# Patient Record
Sex: Female | Born: 1965 | Race: White | Hispanic: No | Smoking: Former smoker
Health system: Southern US, Community
[De-identification: ages and names within clinical notes are randomized; demographics above are authoritative.]

## PROBLEM LIST (undated history)

## (undated) DIAGNOSIS — C539 Malignant neoplasm of cervix uteri, unspecified: Secondary | ICD-10-CM

## (undated) DIAGNOSIS — Z973 Presence of spectacles and contact lenses: Secondary | ICD-10-CM

## (undated) DIAGNOSIS — Z923 Personal history of irradiation: Secondary | ICD-10-CM

## (undated) DIAGNOSIS — T7840XA Allergy, unspecified, initial encounter: Secondary | ICD-10-CM

## (undated) DIAGNOSIS — K219 Gastro-esophageal reflux disease without esophagitis: Secondary | ICD-10-CM

## (undated) DIAGNOSIS — I10 Essential (primary) hypertension: Secondary | ICD-10-CM

## (undated) HISTORY — DX: Allergy, unspecified, initial encounter: T78.40XA

## (undated) HISTORY — DX: Gastro-esophageal reflux disease without esophagitis: K21.9

---

## 2019-02-10 DIAGNOSIS — U071 COVID-19: Secondary | ICD-10-CM

## 2019-02-10 HISTORY — DX: COVID-19: U07.1

## 2020-01-10 ENCOUNTER — Telehealth: Payer: Self-pay | Admitting: *Deleted

## 2020-01-10 NOTE — Telephone Encounter (Signed)
Called the patient and scheduled a new patient appt on 12/3 at 2:15 pm with Dr Berline Lopes. Patient given the address and phone number for the clinic. Patient also given the policy for mask and visitors

## 2020-01-11 ENCOUNTER — Encounter: Payer: Self-pay | Admitting: Gynecologic Oncology

## 2020-01-12 ENCOUNTER — Other Ambulatory Visit: Payer: Self-pay | Admitting: Gynecologic Oncology

## 2020-01-12 ENCOUNTER — Inpatient Hospital Stay: Payer: Medicaid Other

## 2020-01-12 ENCOUNTER — Telehealth: Payer: Self-pay | Admitting: Oncology

## 2020-01-12 ENCOUNTER — Other Ambulatory Visit: Payer: Self-pay

## 2020-01-12 ENCOUNTER — Encounter: Payer: Self-pay | Admitting: Gynecologic Oncology

## 2020-01-12 ENCOUNTER — Inpatient Hospital Stay: Payer: Medicaid Other | Attending: Gynecologic Oncology | Admitting: Gynecologic Oncology

## 2020-01-12 VITALS — BP 158/77 | HR 83 | Temp 98.2°F | Resp 20 | Ht 66.0 in | Wt 162.0 lb

## 2020-01-12 DIAGNOSIS — Z23 Encounter for immunization: Secondary | ICD-10-CM | POA: Diagnosis not present

## 2020-01-12 DIAGNOSIS — N950 Postmenopausal bleeding: Secondary | ICD-10-CM | POA: Insufficient documentation

## 2020-01-12 DIAGNOSIS — C539 Malignant neoplasm of cervix uteri, unspecified: Secondary | ICD-10-CM

## 2020-01-12 LAB — CBC (CANCER CENTER ONLY)
HCT: 38.4 % (ref 36.0–46.0)
Hemoglobin: 12.5 g/dL (ref 12.0–15.0)
MCH: 31.6 pg (ref 26.0–34.0)
MCHC: 32.6 g/dL (ref 30.0–36.0)
MCV: 97 fL (ref 80.0–100.0)
Platelet Count: 294 10*3/uL (ref 150–400)
RBC: 3.96 MIL/uL (ref 3.87–5.11)
RDW: 12.6 % (ref 11.5–15.5)
WBC Count: 10.5 10*3/uL (ref 4.0–10.5)
nRBC: 0 % (ref 0.0–0.2)

## 2020-01-12 LAB — CMP (CANCER CENTER ONLY)
ALT: 36 U/L (ref 0–44)
AST: 47 U/L — ABNORMAL HIGH (ref 15–41)
Albumin: 3.9 g/dL (ref 3.5–5.0)
Alkaline Phosphatase: 66 U/L (ref 38–126)
Anion gap: 11 (ref 5–15)
BUN: 7 mg/dL (ref 6–20)
CO2: 24 mmol/L (ref 22–32)
Calcium: 10.1 mg/dL (ref 8.9–10.3)
Chloride: 106 mmol/L (ref 98–111)
Creatinine: 0.7 mg/dL (ref 0.44–1.00)
GFR, Estimated: >60 mL/min (ref >60)
Glucose, Bld: 92 mg/dL (ref 70–99)
Potassium: 4.3 mmol/L (ref 3.5–5.1)
Sodium: 141 mmol/L (ref 135–145)
Total Bilirubin: 0.4 mg/dL (ref 0.3–1.2)
Total Protein: 9.1 g/dL — ABNORMAL HIGH (ref 6.5–8.1)

## 2020-01-12 NOTE — H&P (View-Only) (Signed)
GYNECOLOGIC ONCOLOGY NEW PATIENT CONSULTATION   Patient Name: Madeline Howard  Patient Age: 54 y.o. Date of Service: 01/12/20 Referring Provider: Dr. Addison Bailey     Primary Care Provider: System, Provider Not In Consulting Provider: Jeral Pinch, MD   Assessment/Plan:  Postmenopausal patient with poor access to care presenting with findings concerning for cervix cancer and possibly concurrent uterine cancer.  I discussed with the patient results from her recent biopsy and Pap test.  I am concerned based on her exam and friability of her cervix that she does not fact have a cervical cancer.  There is no obvious mass on exam.  I took a couple of biopsies from a denuded and somewhat ulcerative portion of the cervix.  I will call her with these results which will hopefully be back on Monday.  Given the scant EMB, we asked pathology to further clarify if the biopsy appeared to be adeno carcinoma or squamous cell carcinoma.  It is unclear to me at this time whether there is a concurrent uterine malignancy or that the tissue sampled on endometrial biopsy is in fact squamous.  Either way, given my exam today, I think we will need more tissue for diagnostic purposes.    I recommended plan for outpatient procedure next week to include hysteroscopy, D&C, and cervical cold knife cone.  I also have ordered an ultrasound, which will be performed on Monday, and a CT scan.  Patient understands that pending the biopsy results from today, surgical plan as well as imaging may change.  A copy of this note was sent to the patient's referring provider.   60 minutes of total time was spent for this patient encounter, including preparation, face-to-face counseling with the patient and coordination of care, and documentation of the encounter.  Jeral Pinch, MD  Division of Gynecologic Oncology  Department of Obstetrics and Gynecology  [facility]   ___________________________________________  Chief Complaint: Chief Complaint  Patient presents with  . Malignant neoplasm of cervix, unspecified site [facility])    History of Present Illness:  Madeline Howard is a 54 y.o. y.o. female who is seen in consultation at the request of Dr. 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  Patient reports only accessing medical care in multiple decades when she has been very sick.  She has not had a primary care provider that she can remember ever.  Her regular menses stopped 2-3 years ago.  She had no postmenopausal bleeding until this November.  She had an episode where she woke up at night with very heavy bleeding and passage of clots.  She tried to get an appointment but this took several weeks.  After a second episode, she called again and was able to be seen quickly.  She denies any abdominal pain or cramping until after her exam and recent biopsy.  She continues to have irregular bleeding, some days she has no bleeding, other she has heavy bleeding or spotting.  She was aeen on 11/18 with several days of PMB with cramping. Underwent EMB showing poorly differentiated carcinoma. 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 + HR HPV.   Patient is uninsured.  She has never had a Pap test, mammogram, or colonoscopy.  She is never had surgery.  She broke her right ankle and foot earlier this year but did not require surgical intervention.  She endorses having a good appetite  without nausea or emesis.  She reports regular bowel function and denies any urinary symptoms.  She denies back pain and leg swelling.  Patient lives in [place] with her daughter and boyfriend.  She vapes, denies cigarette use.  She denies alcohol and recreational drug use.  She is not currently working.  PAST MEDICAL HISTORY:  Past Medical History:  Diagnosis  Date  . Allergy   . GERD (gastroesophageal reflux disease)      PAST SURGICAL HISTORY:  History reviewed. No pertinent surgical history.  OB/GYN HISTORY:  OB History  Gravida Para Term Preterm AB Living  2 2          SAB TAB Ectopic Multiple Live Births               # Outcome Date GA Lbr Len/2nd Weight Sex Delivery Anes PTL Lv  2 Para           1 Para             No LMP recorded.  Age at menarche: 45  Age at menopause: 2-3 years ago Hx of HRT: denies Hx of STDs: denies Last pap: has never had prior to this year History of abnormal pap smears: see HPI  SCREENING STUDIES:  Last mammogram: n/a  Last colonoscopy: n/a Last bone mineral density: n/a  MEDICATIONS: Outpatient Encounter Medications as of 01/12/2020  Medication Sig  . aspirin-acetaminophen-caffeine (EXCEDRIN MIGRAINE) 250-250-65 MG tablet Take by mouth every 6 (six) hours as needed for headache.  . Chlorpheniramine Maleate (ALLERGY PO) Take by mouth.  Marland Kitchen ibuprofen (ADVIL) 200 MG tablet Take 200 mg by mouth every 6 (six) hours as needed.  . Omega-3 Fatty Acids (FISH OIL) 1000 MG CAPS Take 1 capsule by mouth daily.  . raNITIdine HCl (ACID REDUCER PO) Take by mouth.   No facility-administered encounter medications on file as of 01/12/2020.    ALLERGIES:  No Known Allergies   FAMILY HISTORY:  Family History  Problem Relation Age of Onset  . Cancer Mother        colon  . Diabetes Father   . Cancer Brother        melanoma  . Breast cancer Neg Hx   . Uterine cancer Neg Hx   . Ovarian cancer Neg Hx      SOCIAL HISTORY:    Social Connections:   . Frequency of Communication with Friends and Family: Not on file  . Frequency of Social Gatherings with Friends and Family: Not on file  . Attends Religious Services: Not on file  . Active Member of Clubs or Organizations: Not on file  . Attends Archivist Meetings: Not on file  . Marital Status: Not on file    REVIEW OF SYSTEMS:  + Hot flashes  that started in January of this year when she got Covid, vaginal bleeding Denies appetite changes, fevers, chills, fatigue, unexplained weight changes. Denies hearing loss, neck lumps or masses, mouth sores, ringing in ears or voice changes. Denies cough or wheezing.  Denies shortness of breath. Denies chest pain or palpitations. Denies leg swelling. Denies abdominal distention, pain, blood in stools, constipation, diarrhea, nausea, vomiting, or early satiety. Denies pain with intercourse, dysuria, frequency, hematuria or incontinence. Denies pelvic pain or vaginal discharge.   Denies joint pain, back pain or muscle pain/cramps. Denies itching, rash, or wounds. Denies dizziness, headaches, numbness or seizures. Denies swollen lymph nodes or glands, denies easy bruising or bleeding. Denies anxiety, depression, confusion, or decreased concentration.  Physical Exam:  Vital Signs for this encounter:  Blood pressure (!) 158/77, pulse 83, temperature 98.2 F (36.8 C), temperature source Tympanic, resp. rate 20, height 5\' 6"  (1.676 m), weight 162 lb (73.5 kg), SpO2 100 %. Body mass index is 26.15 kg/m. General: Alert, oriented, no acute distress.  HEENT: Normocephalic, atraumatic. Sclera anicteric.  Chest: Clear to auscultation bilaterally. No wheezes, rhonchi, or rales. Cardiovascular: Regular rate and rhythm, no murmurs, rubs, or gallops.  Abdomen: Normoactive bowel sounds. Soft, nondistended, nontender to palpation. No masses or hepatosplenomegaly appreciated. No palpable fluid wave.  Extremities: Grossly normal range of motion. Warm, well perfused. No edema bilaterally.  Skin: No rashes or lesions.  Lymphatics: No cervical, supraclavicular, or inguinal adenopathy.  GU:  Normal external female genitalia.  No lesions. No discharge or bleeding.             Bladder/urethra:  No lesions or masses, well supported bladder             Vagina: Well rugated vaginal mucosa.             Cervix:  Multiparous cervix, no gross lesions apparent.  Slow oozing from the os when exam started.  Laxity of the sidewalls caused some increased difficulty with the exam.  After application of acetic acid, there is some acetowhite changes and almost ulcerative appearance of the 3-6:00 portion of the cervix.  2 biopsies taken from this area with significant bleeding after requiring pressure and Monsel's.  ECC also performed with a Cytobrush.             Uterus: Small, mobile.  Cervix is normal in size although somewhat prominent and mildly firm.  There is some loss of the posterior cervicovaginal junction.  There is also an exophytic smoothest 1 cm lesion from the posterior aspect of the cervix that can be palpated on rectovaginal exam.  No definite parametrial involvement.             Adnexa: No masses appreciated.  Procedure: Cervical biopsy and ECC Preoperative diagnosis: Squamous cell carcinoma on Pap test Postoperative diagnosis: Same as above Physician: Jeral Pinch, MD Estimated blood loss: 75 cc Specimens: 2 biopsies of the cervix from 3 and 5 o'clock, ECC Procedure: After the procedure was discussed with the patient including risks and benefits, verbal consent was obtained.  A speculum was placed in the vagina and the cervix was well visualized.  After application of acetic acid, decision was made to proceed with cervical biopsies.  Betadine was used x3 to cleanse the cervix.  2 biopsies were taken along the left aspect of the cervix with Tischler forceps and placed in formalin.  Brisk bleeding was noted after biopsies with hemostasis achieved with Monsel solution and pressure.  Overall the patient tolerated the procedure well.  An ECC was also performed with a Cytobrush.  All specimens were placed in formalin.  LABORATORY AND RADIOLOGIC DATA:  Outside medical records were reviewed to synthesize the above history, along with the history and physical obtained during the visit.   Lab Results   Component Value Date   WBC 10.5 01/12/2020   HGB 12.5 01/12/2020   HCT 38.4 01/12/2020   PLT 294 01/12/2020   GLUCOSE 92 01/12/2020   ALT 36 01/12/2020   AST 47 (H) 01/12/2020   NA 141 01/12/2020   K 4.3 01/12/2020   CL 106 01/12/2020   CREATININE 0.70 01/12/2020   BUN 7 01/12/2020   CO2 24 01/12/2020

## 2020-01-12 NOTE — Progress Notes (Signed)
GYNECOLOGIC ONCOLOGY NEW PATIENT CONSULTATION   Patient Name: Madeline Howard  Patient Age: 54 y.o. Date of Service: 01/12/20 Referring Provider: Dr. Addison Bailey     Primary Care Provider: System, Provider Not In Consulting Provider: Jeral Pinch, MD   Assessment/Plan:  Postmenopausal patient with poor access to care presenting with findings concerning for cervix cancer and possibly concurrent uterine cancer.  I discussed with the patient results from her recent biopsy and Pap test.  I am concerned based on her exam and friability of her cervix that she does not fact have a cervical cancer.  There is no obvious mass on exam.  I took a couple of biopsies from a denuded and somewhat ulcerative portion of the cervix.  I will call her with these results which will hopefully be back on Monday.  Given the scant EMB, we asked pathology to further clarify if the biopsy appeared to be adeno carcinoma or squamous cell carcinoma.  It is unclear to me at this time whether there is a concurrent uterine malignancy or that the tissue sampled on endometrial biopsy is in fact squamous.  Either way, given my exam today, I think we will need more tissue for diagnostic purposes.    I recommended plan for outpatient procedure next week to include hysteroscopy, D&C, and cervical cold knife cone.  I also have ordered an ultrasound, which will be performed on Monday, and a CT scan.  Patient understands that pending the biopsy results from today, surgical plan as well as imaging may change.  A copy of this note was sent to the patient's referring provider.   60 minutes of total time was spent for this patient encounter, including preparation, face-to-face counseling with the patient and coordination of care, and documentation of the encounter.  Jeral Pinch, MD  Division of Gynecologic Oncology  Department of Obstetrics and Gynecology  [facility]   ___________________________________________  Chief Complaint: Chief Complaint  Patient presents with  . Malignant neoplasm of cervix, unspecified site [facility])    History of Present Illness:  Madeline Howard is a 54 y.o. y.o. female who is seen in consultation at the request of Dr. 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  Patient reports only accessing medical care in multiple decades when she has been very sick.  She has not had a primary care provider that she can remember ever.  Her regular menses stopped 2-3 years ago.  She had no postmenopausal bleeding until this November.  She had an episode where she woke up at night with very heavy bleeding and passage of clots.  She tried to get an appointment but this took several weeks.  After a second episode, she called again and was able to be seen quickly.  She denies any abdominal pain or cramping until after her exam and recent biopsy.  She continues to have irregular bleeding, some days she has no bleeding, other she has heavy bleeding or spotting.  She was aeen on 11/18 with several days of PMB with cramping. Underwent EMB showing poorly differentiated carcinoma. 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 + HR HPV.   Patient is uninsured.  She has never had a Pap test, mammogram, or colonoscopy.  She is never had surgery.  She broke her right ankle and foot earlier this year but did not require surgical intervention.  She endorses having a good appetite  without nausea or emesis.  She reports regular bowel function and denies any urinary symptoms.  She denies back pain and leg swelling.  Patient lives in [place] with her daughter and boyfriend.  She vapes, denies cigarette use.  She denies alcohol and recreational drug use.  She is not currently working.  PAST MEDICAL HISTORY:  Past Medical History:  Diagnosis  Date  . Allergy   . GERD (gastroesophageal reflux disease)      PAST SURGICAL HISTORY:  History reviewed. No pertinent surgical history.  OB/GYN HISTORY:  OB History  Gravida Para Term Preterm AB Living  2 2          SAB TAB Ectopic Multiple Live Births               # Outcome Date GA Lbr Len/2nd Weight Sex Delivery Anes PTL Lv  2 Para           1 Para             No LMP recorded.  Age at menarche: 63  Age at menopause: 2-3 years ago Hx of HRT: denies Hx of STDs: denies Last pap: has never had prior to this year History of abnormal pap smears: see HPI  SCREENING STUDIES:  Last mammogram: n/a  Last colonoscopy: n/a Last bone mineral density: n/a  MEDICATIONS: Outpatient Encounter Medications as of 01/12/2020  Medication Sig  . aspirin-acetaminophen-caffeine (EXCEDRIN MIGRAINE) 250-250-65 MG tablet Take by mouth every 6 (six) hours as needed for headache.  . Chlorpheniramine Maleate (ALLERGY PO) Take by mouth.  Marland Kitchen ibuprofen (ADVIL) 200 MG tablet Take 200 mg by mouth every 6 (six) hours as needed.  . Omega-3 Fatty Acids (FISH OIL) 1000 MG CAPS Take 1 capsule by mouth daily.  . raNITIdine HCl (ACID REDUCER PO) Take by mouth.   No facility-administered encounter medications on file as of 01/12/2020.    ALLERGIES:  No Known Allergies   FAMILY HISTORY:  Family History  Problem Relation Age of Onset  . Cancer Mother        colon  . Diabetes Father   . Cancer Brother        melanoma  . Breast cancer Neg Hx   . Uterine cancer Neg Hx   . Ovarian cancer Neg Hx      SOCIAL HISTORY:    Social Connections:   . Frequency of Communication with Friends and Family: Not on file  . Frequency of Social Gatherings with Friends and Family: Not on file  . Attends Religious Services: Not on file  . Active Member of Clubs or Organizations: Not on file  . Attends Archivist Meetings: Not on file  . Marital Status: Not on file    REVIEW OF SYSTEMS:  + Hot flashes  that started in January of this year when she got Covid, vaginal bleeding Denies appetite changes, fevers, chills, fatigue, unexplained weight changes. Denies hearing loss, neck lumps or masses, mouth sores, ringing in ears or voice changes. Denies cough or wheezing.  Denies shortness of breath. Denies chest pain or palpitations. Denies leg swelling. Denies abdominal distention, pain, blood in stools, constipation, diarrhea, nausea, vomiting, or early satiety. Denies pain with intercourse, dysuria, frequency, hematuria or incontinence. Denies pelvic pain or vaginal discharge.   Denies joint pain, back pain or muscle pain/cramps. Denies itching, rash, or wounds. Denies dizziness, headaches, numbness or seizures. Denies swollen lymph nodes or glands, denies easy bruising or bleeding. Denies anxiety, depression, confusion, or decreased concentration.  Physical Exam:  Vital Signs for this encounter:  Blood pressure (!) 158/77, pulse 83, temperature 98.2 F (36.8 C), temperature source Tympanic, resp. rate 20, height 5\' 6"  (1.676 m), weight 162 lb (73.5 kg), SpO2 100 %. Body mass index is 26.15 kg/m. General: Alert, oriented, no acute distress.  HEENT: Normocephalic, atraumatic. Sclera anicteric.  Chest: Clear to auscultation bilaterally. No wheezes, rhonchi, or rales. Cardiovascular: Regular rate and rhythm, no murmurs, rubs, or gallops.  Abdomen: Normoactive bowel sounds. Soft, nondistended, nontender to palpation. No masses or hepatosplenomegaly appreciated. No palpable fluid wave.  Extremities: Grossly normal range of motion. Warm, well perfused. No edema bilaterally.  Skin: No rashes or lesions.  Lymphatics: No cervical, supraclavicular, or inguinal adenopathy.  GU:  Normal external female genitalia.  No lesions. No discharge or bleeding.             Bladder/urethra:  No lesions or masses, well supported bladder             Vagina: Well rugated vaginal mucosa.             Cervix:  Multiparous cervix, no gross lesions apparent.  Slow oozing from the os when exam started.  Laxity of the sidewalls caused some increased difficulty with the exam.  After application of acetic acid, there is some acetowhite changes and almost ulcerative appearance of the 3-6:00 portion of the cervix.  2 biopsies taken from this area with significant bleeding after requiring pressure and Monsel's.  ECC also performed with a Cytobrush.             Uterus: Small, mobile.  Cervix is normal in size although somewhat prominent and mildly firm.  There is some loss of the posterior cervicovaginal junction.  There is also an exophytic smoothest 1 cm lesion from the posterior aspect of the cervix that can be palpated on rectovaginal exam.  No definite parametrial involvement.             Adnexa: No masses appreciated.  Procedure: Cervical biopsy and ECC Preoperative diagnosis: Squamous cell carcinoma on Pap test Postoperative diagnosis: Same as above Physician: Jeral Pinch, MD Estimated blood loss: 75 cc Specimens: 2 biopsies of the cervix from 3 and 5 o'clock, ECC Procedure: After the procedure was discussed with the patient including risks and benefits, verbal consent was obtained.  A speculum was placed in the vagina and the cervix was well visualized.  After application of acetic acid, decision was made to proceed with cervical biopsies.  Betadine was used x3 to cleanse the cervix.  2 biopsies were taken along the left aspect of the cervix with Tischler forceps and placed in formalin.  Brisk bleeding was noted after biopsies with hemostasis achieved with Monsel solution and pressure.  Overall the patient tolerated the procedure well.  An ECC was also performed with a Cytobrush.  All specimens were placed in formalin.  LABORATORY AND RADIOLOGIC DATA:  Outside medical records were reviewed to synthesize the above history, along with the history and physical obtained during the visit.   Lab Results   Component Value Date   WBC 10.5 01/12/2020   HGB 12.5 01/12/2020   HCT 38.4 01/12/2020   PLT 294 01/12/2020   GLUCOSE 92 01/12/2020   ALT 36 01/12/2020   AST 47 (H) 01/12/2020   NA 141 01/12/2020   K 4.3 01/12/2020   CL 106 01/12/2020   CREATININE 0.70 01/12/2020   BUN 7 01/12/2020   CO2 24 01/12/2020

## 2020-01-12 NOTE — Patient Instructions (Signed)
Plan for an ultrasound and CT scan. Dr. Berline Lopes will call you with the results of your biopsies from today and the imaging.   Preparing for your Surgery  Plan for surgery on January 17, 2020 with Dr. Jeral Pinch at [place] will be scheduled for a examination under anesthesia, dilation and curettage of the uterus with hysteroscopy, cold knife conization of the cervix.   Pre-operative Testing -You will receive a phone call from presurgical testing at [facility] to arrange for a pre-operative appointment, lab appointment, and COVID test. The COVID test normally happens 3 days prior to the surgery and they ask that you self quarantine after the test up until surgery to decrease chance of exposure.  -Bring your insurance card, copy of an advanced directive if applicable, medication list  -At that visit, you will be asked to sign a consent for a possible blood transfusion in case a transfusion becomes necessary during surgery.  The need for a blood transfusion is rare but having consent is a necessary part of your care.     -You should not be taking blood thinners or aspirin at least ten days prior to surgery unless instructed by your surgeon.  -Do not take supplements such as fish oil (omega 3), red yeast rice, turmeric before your surgery.   Day Before Surgery at [place] will be advised you can have clear liquids up until 3 hours before your surgery.    Your role in recovery Your role is to become active as soon as directed by your doctor, while still giving yourself time to heal.  Rest when you feel tired. You will be asked to do the following in order to speed your recovery:  - Cough and breathe deeply. This helps to clear and expand your lungs and can prevent pneumonia after surgery.  - [place]. Do mild physical activity. Walking or moving your legs help your circulation and body functions return to normal. Do not try to get up or walk  alone the first time after surgery.   -If you develop swelling on one leg or the other, pain in the back of your leg, redness/warmth in one of your legs, please call the office or go to the Emergency Room to have a doppler to rule out a blood clot. For shortness of breath, chest pain-seek care in the Emergency Room as soon as possible. - Actively manage your pain. Managing your pain lets you move in comfort. We will ask you to rate your pain on a scale of zero to 10. It is your responsibility to tell your doctor or nurse where and how much you hurt so your pain can be treated.  Pain Management After Surgery -Make sure that you have Tylenol and Ibuprofen at home to use on a regular basis after surgery for pain control. We recommend alternating the medications every hour to six hours since they work differently and are processed in the body differently for pain relief.  Bowel Regimen -You can use Sennakot-S to take nightly to prevent constipation if needed.  It is important to prevent constipation and drink adequate amounts of liquids.   Risks of Surgery Risks of surgery are low but include bleeding, infection, damage to surrounding structures, re-operation, blood clots, and very rarely death.   Blood Transfusion Information (For the consent to be signed before surgery)  We will be checking your blood type before surgery so in case of emergencies, we will  know what type of blood you would need.                                            WHAT IS A BLOOD TRANSFUSION?  A transfusion is the replacement of blood or some of its parts. Blood is made up of multiple cells which provide different functions.  Red blood cells carry oxygen and are used for blood loss replacement.  White blood cells fight against infection.  Platelets control bleeding.  Plasma helps clot blood.  Other blood products are available for specialized needs, such as hemophilia or other clotting disorders. BEFORE THE  TRANSFUSION  Who gives blood for transfusions?   You may be able to donate blood to be used at a later date on yourself (autologous donation).  Relatives can be asked to donate blood. This is generally not any safer than if you have received blood from a stranger. The same precautions are taken to ensure safety when a relative's blood is donated.  Healthy volunteers who are fully evaluated to make sure their blood is safe. This is blood bank blood. Transfusion therapy is the safest it has ever been in the practice of medicine. Before blood is taken from a donor, a complete history is taken to make sure that person has no history of diseases nor engages in risky social behavior (examples are intravenous drug use or sexual activity with multiple partners). The donor's travel history is screened to minimize risk of transmitting infections, such as malaria. The donated blood is tested for signs of infectious diseases, such as HIV and hepatitis. The blood is then tested to be sure it is compatible with you in order to minimize the chance of a transfusion reaction. If you or a relative donates blood, this is often done in anticipation of surgery and is not appropriate for emergency situations. It takes many days to process the donated blood. RISKS AND COMPLICATIONS Although transfusion therapy is very safe and saves many lives, the main dangers of transfusion include:   Getting an infectious disease.  Developing a transfusion reaction. This is an allergic reaction to something in the blood you were given. Every precaution is taken to prevent this. The decision to have a blood transfusion has been considered carefully by your caregiver before blood is given. Blood is not given unless the benefits outweigh the risks.  AFTER SURGERY INSTRUCTIONS  Return to work: 1 weeks if applicable  Activity: 1. Be up and out of the bed during the day.  Take a nap if needed.  You may walk up steps but be careful and  use the hand rail.  Stair climbing will tire you more than you think, you may need to stop part way and rest.   2. No lifting or straining for 4 weeks over 10 pounds. No pushing, pulling, straining for 4 weeks.  3. No driving for 48 hours after the procedure.  Make sure that your reaction time has returned.   4. You can shower as soon as the next day after surgery. Shower daily. No tub baths or submerging your body in water until cleared by your surgeon.   5. No sexual activity and nothing in the vagina for 4 weeks.  6. You may experience vaginal spotting/drainage after surgery.  The spotting is normal but if you experience heavy bleeding, call our office.  7. Take  Tylenol or ibuprofen for pain.  Monitor your Tylenol intake to a max of 4,000 mg in a 24 hour period. You can alternate these medications after surgery.  Diet: 1. Low sodium Heart Healthy Diet is recommended but you are cleared to resume your normal (before surgery) diet after your procedure.  2. It is safe to use a laxative, such as Miralax or Colace, if you have difficulty moving your bowels.   Wound Care: 1. Keep clean and dry.  Shower daily.  Reasons to call the Doctor:  Fever - Oral temperature greater than 100.4 degrees Fahrenheit  Foul-smelling vaginal discharge  Difficulty urinating  Nausea and vomiting  Increased pain at the site of the incision that is unrelieved with pain medicine.  Difficulty breathing with or without chest pain  New calf pain especially if only on one side  Sudden, continuing increased vaginal bleeding with or without clots.   Contacts: For questions or concerns you should contact:  Dr. Jeral Pinch at [phone]  Joylene John, NP at [phone]  After Hours: call [phone] and have the GYN Oncologist paged/contacted (after 5 pm or on the weekends)   Cervical Conization  Cervical conization (cone biopsy) is a procedure in which a cone-shaped portion of the cervix  is cut out so that it can be looked at under a microscope. The cervix is the lowest part of the uterus. This procedure is done to check for cancer cells or cells that might turn into cancer (precancerous cells). You may have this procedure if:  You have abnormal bleeding from your cervix.  You had an abnormal Pap test.  Something abnormal was seen on your cervix during an exam. This procedure is performed in either a health care provider's office or in an operating room. Tell a health care provider about:  Any allergies you have.  All medicines you are taking, including vitamins, herbs, eye drops, creams, and over-the-counter medicines.  Any problems you or family members have had with anesthetic medicines.  Any blood disorders you have.  Any surgeries you have had.  Any medical conditions you have.  Your use of nicotine or tobacco products.  When you normally have your menstrual period.  Whether you are pregnant or may be pregnant. What are the risks? Generally, this is a safe procedure. However, problems may occur, including:  Heavy bleeding for several days or weeks after the procedure.  Allergic reactions to medicines or to dyes or other solutions.  Increased risk of early (preterm) labor in future pregnancies.  Infection. This is rare.  Damage to the cervix or nearby structures or organs. This is rare. What happens before the procedure? Medicines Ask your health care provider about:  Changing or stopping your regular medicines. This is especially important if you are taking diabetes medicines or blood thinners.  Taking medicines such as aspirin and ibuprofen. These medicines can thin your blood. Do not take these medicines unless your health care provider tells you to take them.  Taking over-the-counter medicines, vitamins, herbs, and supplements. General instructions  If told by your health care provider, do not put anything in the vagina before the procedure.  Do not douche, have sex, use tampons, or use any vaginal medicines before the procedure.  Ask your health care provider: ? How your surgery site will be marked. ? What steps will be taken to help prevent infection. These may include:  Removing hair at the surgery site.  Washing skin with a germ-killing soap.  Taking antibiotic medicine.  Plan to have someone take you home from the hospital or clinic.  If you will be going home right after the procedure, plan to have someone with you for 24 hours.  You may be asked to empty your bladder and bowel right before the procedure. What happens during the procedure?      You will lie on an examining table and put your feet in footrests (stirrups).  An IV will be inserted into one of your veins.  You will be given one or more of the following: ? A medicine to help you relax (sedative). ? A medicine to numb the area (local anesthetic). ? A medicine to make you fall asleep (general anesthetic). ? A medicine that numbs the cervix (cervical block).  A lubricated device called a speculum will be inserted into your vagina. It will be used to spread open the walls of the vagina so your health care provider can better see the inside of the vagina and cervix.  An instrument that has a magnifying lens and a light (colposcope) will let your health care provider examine the cervix more closely.  Your health care provider will apply a solution to your cervix. This turns abnormal areas a pale color.  A tissue sample will be removed from the cervix using one of the following methods: ? The cold knife method. The tissue is cut out with a knife (scalpel). ? The loop electrosurgical excision procedure (LEEP) method. The tissue is cut out with a thin wire that can burn (cauterize) the tissue with an electrical current. ? Laser treatment method. The tissue is cut out and then cauterized with a laser beam to prevent bleeding.  Your health care provider  will apply a paste over the biopsy areas to help control bleeding.  The tissue sample will be checked under a microscope. The procedure may vary among health care providers and hospitals. What happens after the procedure?  Your blood pressure, heart rate, breathing rate, and blood oxygen level will be monitored until you leave the hospital or clinic.  If you were given a local anesthetic, you will rest at the clinic or hospital until you are stable and ready to go home.  If you were given a general anesthetic, you may be monitored for a longer period of time.  You may have some cramping.  You may have bloody discharge or light to moderate bleeding.  You may have dark discharge coming from your vagina. This is from the paste used on the cervix to prevent bleeding. Summary  Cervical conization is a procedure in which a cone-shaped portion of the cervix is cut out so that it can be examined under a microscope.  This procedure is done to check for cancer cells or cells that might turn into cancer (precancerous cells).  After the procedure, you may have bloody discharge or light to moderate bleeding. You may also have dark discharge from the paste that was used on the cervix to prevent bleeding. This information is not intended to replace advice given to you by your health care provider. Make sure you discuss any questions you have with your health care provider. Document Revised: 07/25/2018 Document Reviewed: 07/25/2018 Elsevier Patient Education  [employer].   Dilation and Curettage or Vacuum Curettage  Dilation and curettage (D&C) and vacuum curettage are minor procedures. A D&C involves stretching (dilation) the cervix and scraping (curettage) the inside lining of the uterus (endometrium). During a D&C, tissue is gently scraped from the endometrium,  starting from the top portion of the uterus down to the lowest part of the uterus (cervix). During a vacuum curettage, the lining  and tissue in the uterus are removed with the use of gentle suction. Curettage may be performed to either diagnose or treat a problem. As a diagnostic procedure, curettage is performed to examine tissues from the uterus. A diagnostic curettage may be done if you have:  Irregular bleeding in the uterus.  Bleeding with the development of clots.  Spotting between menstrual periods.  Prolonged menstrual periods or other abnormal bleeding.  Bleeding after menopause.  No menstrual period (amenorrhea).  A change in size and shape of the uterus.  Abnormal endometrial cells discovered during a Pap test. As a treatment procedure, curettage may be performed for the following reasons:  Removal of an IUD (intrauterine device).  Removal of retained placenta after giving birth.  Abortion.  Miscarriage.  Removal of endometrial polyps.  Removal of uncommon types of noncancerous lumps (fibroids). Tell a health care provider about:  Any allergies you have, including allergies to prescribed medicine or latex.  All medicines you are taking, including vitamins, herbs, eye drops, creams, and over-the-counter medicines. This is especially important if you take any blood-thinning medicine. Bring a list of all of your medicines to your appointment.  Any problems you or family members have had with anesthetic medicines.  Any blood disorders you have.  Any surgeries you have had.  Your medical history and any medical conditions you have.  Whether you are pregnant or may be pregnant.  Recent vaginal infections you have had.  Recent menstrual periods, bleeding problems you have had, and what form of birth control (contraception) you use. What are the risks? Generally, this is a safe procedure. However, problems may occur, including:  Infection.  Heavy vaginal bleeding.  Allergic reactions to medicines.  Damage to the cervix or other structures or organs.  Development of scar tissue  (adhesions) inside the uterus, which can cause abnormal amounts of menstrual bleeding. This may make it harder to get pregnant in the future.  A hole (perforation) or puncture in the uterine wall. This is rare. What happens before the procedure? Medicines  Ask your health care provider about: ? Changing or stopping your regular medicines. This is especially important if you are taking diabetes medicines or blood thinners. ? Taking medicines such as aspirin and ibuprofen. These medicines can thin your blood. Do not take these medicines before your procedure if your health care provider instructs you not to.  You may be given antibiotic medicine to help prevent infection. General instructions  For 24 hours before your procedure, do not: ? Douche. ? Use tampons. ? Use medicines, creams, or suppositories in the vagina. ? Have sexual intercourse.  You may be given a pregnancy test on the day of the procedure.  Plan to have someone take you home from the hospital or clinic.  You may have a blood or urine sample taken.  If you will be going home right after the procedure, plan to have someone with you for 24 hours. What happens during the procedure?  To reduce your risk of infection: ? Your health care team will wash or sanitize their hands. ? Your skin will be washed with soap.  An IV tube will be inserted into one of your veins.  You will be given one of the following: ? A medicine that numbs the area in and around the cervix (local anesthetic). ? A medicine to  make you fall asleep (general anesthetic).  You will lie down on your back, with your feet in foot rests (stirrups).  The size and position of your uterus will be checked.  A lubricated instrument (speculum or Sims retractor) will be inserted into the back side of your vagina. The speculum will be used to hold apart the walls of your vagina so your health care provider can see your cervix.  A tool (tenaculum) will be  attached to the lip of the cervix to stabilize it.  Your cervix will be softened and dilated. This may be done by: ? Taking a medicine. ? Having tapered dilators or thin rods (laminaria) or gradual widening instruments (tapered dilators) inserted into your cervix.  A small, sharp, curved instrument (curette) will be used to scrape a small amount of tissue or cells from the endometrium or cervical canal. In some cases, gentle suction is applied with the curette. The curette will then be removed. The cells will be taken to a lab for testing. The procedure may vary among health care providers and hospitals. What happens after the procedure?  You may have mild cramping, backache, pain, and light bleeding or spotting. You may pass small blood clots from your vagina.  You may have to wear compression stockings. These stockings help to prevent blood clots and reduce swelling in your legs.  Your blood pressure, heart rate, breathing rate, and blood oxygen level will be monitored until the medicines you were given have worn off. Summary  Dilation and curettage (D&C) involves stretching (dilation) the cervix and scraping (curettage) the inside lining of the uterus (endometrium).  After the procedure, you may have mild cramping, backache, pain, and light bleeding or spotting. You may pass small blood clots from your vagina.  Plan to have someone take you home from the hospital or clinic. This information is not intended to replace advice given to you by your health care provider. Make sure you discuss any questions you have with your health care provider. Document Revised: 01/08/2017 Document Reviewed: 10/13/2015 Elsevier Patient Education  2020 [employer].

## 2020-01-12 NOTE — Telephone Encounter (Signed)
Called [facility] regarding endometrial biopsy from 12/28/19.  Dr. [place] will have Dr. Saralyn Pilar review the slides to see if cells are adenocarcinoma or squamous cell. They will let us know on Monday, 01/15/20.

## 2020-01-12 NOTE — Patient Instructions (Signed)
DUE TO COVID-19 ONLY ONE VISITOR IS ALLOWED TO COME WITH YOU AND STAY IN THE WAITING ROOM ONLY DURING PRE OP AND PROCEDURE DAY OF SURGERY. THE 1 VISITOR  MAY VISIT WITH YOU AFTER SURGERY IN YOUR PRIVATE ROOM DURING VISITING HOURS ONLY!  . ONCE YOUR COVID TEST IS COMPLETED,  PLEASE BEGIN THE QUARANTINE INSTRUCTIONS AS OUTLINED IN YOUR HANDOUT.                Madeline Howard    Your procedure is scheduled on: 01/17/20   Report to [facility] Main  Entrance   Report to admitting at  6:30 AM     Call this number if you have problems the morning of surgery [phone]    Remember: Do not eat food After Midnight.   You may have clear liquid until 5:30 AM  BRUSH YOUR TEETH MORNING OF SURGERY AND RINSE YOUR MOUTH OUT, NO CHEWING GUM CANDY OR MINTS.     Take these medicines the morning of surgery with A SIP OF WATER: None                                You may not have any metal on your body including hair pins and              piercings  Do not wear jewelry, make-up, lotions, powders or perfumes, deodorant             Do not wear nail polish on your fingernails.  Do not shave  48 hours prior to surgery.               Do not bring valuables to the hospital. [place].  Contacts, dentures or bridgework may not be worn into surgery.       Patients discharged the day of surgery will not be allowed to drive home.   IF YOU ARE HAVING SURGERY AND GOING HOME THE SAME DAY, YOU MUST HAVE AN ADULT TO DRIVE YOU HOME AND BE WITH YOU FOR 24 HOURS.  YOU MAY GO HOME BY TAXI OR UBER OR ORTHERWISE, BUT AN ADULT MUST ACCOMPANY YOU HOME AND STAY WITH YOU FOR 24 HOURS.  Name and phone number of your driver:  Special Instructions: N/A              Please read over the following fact sheets you were given: _____________________________________________________________________             [facility] - Preparing for Surgery Before surgery,  you can play an important role.   Because skin is not sterile, your skin needs to be as free of germs as possible.   You can reduce the number of germs on your skin by washing with CHG (chlorahexidine gluconate) soap before surgery.   CHG is an antiseptic cleaner which kills germs and bonds with the skin to continue killing germs even after washing. Please DO NOT use if you have an allergy to CHG or antibacterial soaps.   If your skin becomes reddened/irritated stop using the CHG and inform your nurse when you arrive at Short Stay. Do not shave (including legs and underarms) for at least 48 hours prior to the first CHG shower.   Please follow these instructions carefully:  1.  Shower with CHG Soap the night before  surgery and the  morning of Surgery.  2.  If you choose to wash your hair, wash your hair first as usual with your  normal  shampoo.  3.  After you shampoo, rinse your hair and body thoroughly to remove the  shampoo.                                        4.  Use CHG as you would any other liquid soap.  You can apply chg directly  to the skin and wash                       Gently with a scrungie or clean washcloth.  5.  Apply the CHG Soap to your body ONLY FROM THE NECK DOWN.   Do not use on face/ open                           Wound or open sores. Avoid contact with eyes, ears mouth and genitals (private parts).                       Wash face,  Genitals (private parts) with your normal soap.             6.  Wash thoroughly, paying special attention to the area where your surgery  will be performed.  7.  Thoroughly rinse your body with warm water from the neck down.  8.  DO NOT shower/wash with your normal soap after using and rinsing off  the CHG Soap.             9.  Pat yourself dry with a clean towel.            10.  Wear clean pajamas.            11.  Place clean sheets on your bed the night of your first shower and do not  sleep with pets. Day of Surgery : Do not apply any  lotions/deodorants the morning of surgery.  Please wear clean clothes to the hospital/surgery center.  FAILURE TO FOLLOW THESE INSTRUCTIONS MAY RESULT IN THE CANCELLATION OF YOUR SURGERY PATIENT SIGNATURE_________________________________  NURSE SIGNATURE__________________________________  ________________________________________________________________________   ________________________________________________________________________  WHAT IS A BLOOD TRANSFUSION? Blood Transfusion Information  A transfusion is the replacement of blood or some of its parts. Blood is made up of multiple cells which provide different functions.  Red blood cells carry oxygen and are used for blood loss replacement.  White blood cells fight against infection.  Platelets control bleeding.  Plasma helps clot blood.  Other blood products are available for specialized needs, such as hemophilia or other clotting disorders. BEFORE THE TRANSFUSION  Who gives blood for transfusions?   Healthy volunteers who are fully evaluated to make sure their blood is safe. This is blood bank blood. Transfusion therapy is the safest it has ever been in the practice of medicine. Before blood is taken from a donor, a complete history is taken to make sure that person has no history of diseases nor engages in risky social behavior (examples are intravenous drug use or sexual activity with multiple partners). The donor's travel history is screened to minimize risk of transmitting infections, such as malaria. The donated blood is tested for signs of infectious diseases, such as HIV and hepatitis.  The blood is then tested to be sure it is compatible with you in order to minimize the chance of a transfusion reaction. If you or a relative donates blood, this is often done in anticipation of surgery and is not appropriate for emergency situations. It takes many days to process the donated blood. RISKS AND COMPLICATIONS Although  transfusion therapy is very safe and saves many lives, the main dangers of transfusion include:   Getting an infectious disease.  Developing a transfusion reaction. This is an allergic reaction to something in the blood you were given. Every precaution is taken to prevent this. The decision to have a blood transfusion has been considered carefully by your caregiver before blood is given. Blood is not given unless the benefits outweigh the risks. AFTER THE TRANSFUSION  Right after receiving a blood transfusion, you will usually feel much better and more energetic. This is especially true if your red blood cells have gotten low (anemic). The transfusion raises the level of the red blood cells which carry oxygen, and this usually causes an energy increase.  The nurse administering the transfusion will monitor you carefully for complications. HOME CARE INSTRUCTIONS  No special instructions are needed after a transfusion. You may find your energy is better. Speak with your caregiver about any limitations on activity for underlying diseases you may have. SEEK MEDICAL CARE IF:   Your condition is not improving after your transfusion.  You develop redness or irritation at the intravenous (IV) site. SEEK IMMEDIATE MEDICAL CARE IF:  Any of the following symptoms occur over the next 12 hours:  Shaking chills.  You have a temperature by mouth above 102 F (38.9 C), not controlled by medicine.  Chest, back, or muscle pain.  People around you feel you are not acting correctly or are confused.  Shortness of breath or difficulty breathing.  Dizziness and fainting.  You get a rash or develop hives.  You have a decrease in urine output.  Your urine turns a dark color or changes to pink, red, or brown. Any of the following symptoms occur over the next 10 days:  You have a temperature by mouth above 102 F (38.9 C), not controlled by medicine.  Shortness of breath.  Weakness after normal  activity.  The white part of the eye turns yellow (jaundice).  You have a decrease in the amount of urine or are urinating less often.  Your urine turns a dark color or changes to pink, red, or brown. Document Released: 01/24/2000 Document Revised: 04/20/2011 Document Reviewed: 09/12/2007 [facility] Patient Information 2014 [place], [place].  _______________________________________________________________________

## 2020-01-13 ENCOUNTER — Other Ambulatory Visit (HOSPITAL_COMMUNITY)
Admission: RE | Admit: 2020-01-13 | Discharge: 2020-01-13 | Disposition: A | Discharge status: Home / Self Care | Payer: Medicaid Other | Source: Ambulatory Visit | Attending: Gynecologic Oncology | Admitting: Gynecologic Oncology

## 2020-01-13 DIAGNOSIS — Z01812 Encounter for preprocedural laboratory examination: Secondary | ICD-10-CM | POA: Diagnosis not present

## 2020-01-13 DIAGNOSIS — Z20822 Contact with and (suspected) exposure to covid-19: Secondary | ICD-10-CM | POA: Insufficient documentation

## 2020-01-14 LAB — SARS CORONAVIRUS 2 (TAT 6-24 HRS): SARS Coronavirus 2: NEGATIVE

## 2020-01-15 ENCOUNTER — Encounter (HOSPITAL_COMMUNITY): Payer: Self-pay

## 2020-01-15 ENCOUNTER — Telehealth: Payer: Self-pay | Admitting: Gynecologic Oncology

## 2020-01-15 ENCOUNTER — Telehealth: Payer: Self-pay

## 2020-01-15 ENCOUNTER — Other Ambulatory Visit: Payer: Self-pay

## 2020-01-15 ENCOUNTER — Telehealth: Payer: Self-pay | Admitting: Oncology

## 2020-01-15 ENCOUNTER — Encounter (HOSPITAL_COMMUNITY)
Admission: RE | Admit: 2020-01-15 | Discharge: 2020-01-15 | Disposition: A | Discharge status: Home / Self Care | Payer: Medicaid Other | Source: Ambulatory Visit | Attending: Gynecologic Oncology | Admitting: Gynecologic Oncology

## 2020-01-15 ENCOUNTER — Telehealth: Payer: Self-pay | Admitting: *Deleted

## 2020-01-15 ENCOUNTER — Ambulatory Visit (HOSPITAL_COMMUNITY): Admission: RE | Admit: 2020-01-15 | Payer: Medicaid Other | Source: Ambulatory Visit

## 2020-01-15 DIAGNOSIS — Z01812 Encounter for preprocedural laboratory examination: Secondary | ICD-10-CM | POA: Insufficient documentation

## 2020-01-15 LAB — CBC
HCT: 37.7 % (ref 36.0–46.0)
Hemoglobin: 12.4 g/dL (ref 12.0–15.0)
MCH: 31.7 pg (ref 26.0–34.0)
MCHC: 32.9 g/dL (ref 30.0–36.0)
MCV: 96.4 fL (ref 80.0–100.0)
Platelets: 299 10*3/uL (ref 150–400)
RBC: 3.91 MIL/uL (ref 3.87–5.11)
RDW: 12.5 % (ref 11.5–15.5)
WBC: 6.8 10*3/uL (ref 4.0–10.5)
nRBC: 0 % (ref 0.0–0.2)

## 2020-01-15 LAB — SURGICAL PATHOLOGY

## 2020-01-15 NOTE — Telephone Encounter (Signed)
Called the patient to review biopsy results from Friday. 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 Although very friable within central cervix, there was no clear and concerning-appearing lesion. 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 I discussed with her that we will plan to cancel ultrasound scheduled for later today. We will still plan on OR on Wednesday for EUA, CKC, ECC, and likely EMB to r/o concurrent uterine process. My assumption is that EMB showing high grade carcinoma was actually [facility] from her cervix. Patient amenable to plan.  Jeral Pinch MD Gynecologic Oncology

## 2020-01-15 NOTE — Progress Notes (Signed)
COVID Vaccine Completed:No Date COVID Vaccine completed: COVID vaccine manufacturer: [employer]   PCP - none- Dr. Berline Lopes Cardiologist - none  Chest x-ray - no EKG - no Stress Test - no ECHO - no Cardiac Cath - no Pacemaker/ICD device last checked:NA  Sleep Study - no CPAP -   Fasting Blood Sugar - NA Checks Blood Sugar _____ times a day  Blood Thinner Instructions:Na Aspirin Instructions: Last Dose:  Anesthesia review:   Patient denies shortness of breath, fever, cough and chest pain at PAT appointment yes   Patient verbalized understanding of instructions that were given to them at the PAT appointment. Patient was also instructed that they will need to review over the PAT instructions again at home before surgery. Yes No SOB with any activities

## 2020-01-15 NOTE — Telephone Encounter (Signed)
Fax office note to Dr Addison Bailey

## 2020-01-15 NOTE — Telephone Encounter (Signed)
Dr. Martinique from [facility] Pathology called and said he has reviewed the slides and both the endometrial biopsy - Accession [id number] and PAP results - Accession [id number] show poorly differentiated squamous cell carcinoma.

## 2020-01-15 NOTE — Telephone Encounter (Signed)
Reviewed written instruction from 01-15-20 with Ms Swaggerty. Pt verbalized understanding and had no questions or concerns at this time.

## 2020-01-17 ENCOUNTER — Ambulatory Visit (HOSPITAL_COMMUNITY)
Admission: RE | Admit: 2020-01-17 | Discharge: 2020-01-17 | Disposition: A | Discharge status: Home / Self Care | Payer: Medicaid Other | Source: Other Acute Inpatient Hospital | Attending: Gynecologic Oncology | Admitting: Gynecologic Oncology

## 2020-01-17 ENCOUNTER — Ambulatory Visit (HOSPITAL_COMMUNITY): Payer: Medicaid Other | Admitting: Anesthesiology

## 2020-01-17 ENCOUNTER — Encounter (HOSPITAL_COMMUNITY): Payer: Self-pay | Admitting: Gynecologic Oncology

## 2020-01-17 ENCOUNTER — Encounter (HOSPITAL_COMMUNITY)
Admission: RE | Disposition: A | Discharge status: Home / Self Care | Payer: Self-pay | Source: Other Acute Inpatient Hospital | Attending: Gynecologic Oncology

## 2020-01-17 DIAGNOSIS — C538 Malignant neoplasm of overlapping sites of cervix uteri: Secondary | ICD-10-CM | POA: Insufficient documentation

## 2020-01-17 DIAGNOSIS — C539 Malignant neoplasm of cervix uteri, unspecified: Secondary | ICD-10-CM | POA: Diagnosis present

## 2020-01-17 DIAGNOSIS — N950 Postmenopausal bleeding: Secondary | ICD-10-CM | POA: Diagnosis not present

## 2020-01-17 LAB — TYPE AND SCREEN
ABO/RH(D): A POS
Antibody Screen: NEGATIVE

## 2020-01-17 LAB — ABO/RH: ABO/RH(D): A POS

## 2020-01-17 SURGERY — EXAM UNDER ANESTHESIA
Anesthesia: General

## 2020-01-17 MED ORDER — CHLORHEXIDINE GLUCONATE 0.12 % MT SOLN
15.0000 mL | Freq: Once | OROMUCOSAL | Status: AC
  Administered 2020-01-17: 15 mL via OROMUCOSAL

## 2020-01-17 MED ORDER — PHENYLEPHRINE HCL (PRESSORS) 10 MG/ML IV SOLN
INTRAVENOUS | Status: AC
  Filled 2020-01-17: qty 7

## 2020-01-17 MED ORDER — GABAPENTIN 300 MG PO CAPS
300.0000 mg | ORAL_CAPSULE | ORAL | Status: AC
  Administered 2020-01-17: 300 mg via ORAL
  Filled 2020-01-17: qty 1

## 2020-01-17 MED ORDER — IODINE STRONG (LUGOLS) 5 % PO SOLN
ORAL | Status: AC
  Filled 2020-01-17: qty 1

## 2020-01-17 MED ORDER — ACETAMINOPHEN 10 MG/ML IV SOLN: 1000.0000 mg | Freq: Once | INTRAVENOUS | Status: DC | PRN

## 2020-01-17 MED ORDER — MEPIVACAINE HCL (PF) 2 % IJ SOLN
INTRAMUSCULAR | Status: AC
  Filled 2020-01-17: qty 20

## 2020-01-17 MED ORDER — DEXMEDETOMIDINE (PRECEDEX) IN NS 20 MCG/5ML (4 MCG/ML) IV SYRINGE
PREFILLED_SYRINGE | INTRAVENOUS | Status: AC
  Filled 2020-01-17: qty 5

## 2020-01-17 MED ORDER — DEXMEDETOMIDINE (PRECEDEX) IN NS 20 MCG/5ML (4 MCG/ML) IV SYRINGE
PREFILLED_SYRINGE | INTRAVENOUS | Status: DC | PRN
  Administered 2020-01-17: 4 ug via INTRAVENOUS
  Administered 2020-01-17: 8 ug via INTRAVENOUS

## 2020-01-17 MED ORDER — LACTATED RINGERS IV SOLN: INTRAVENOUS | Status: DC

## 2020-01-17 MED ORDER — ACETAMINOPHEN 500 MG PO TABS
1000.0000 mg | ORAL_TABLET | ORAL | Status: AC
  Administered 2020-01-17: 1000 mg via ORAL
  Filled 2020-01-17: qty 2

## 2020-01-17 MED ORDER — LIDOCAINE HCL (PF) 1 % IJ SOLN
INTRAMUSCULAR | Status: AC
  Filled 2020-01-17: qty 30

## 2020-01-17 MED ORDER — FENTANYL CITRATE (PF) 100 MCG/2ML IJ SOLN
INTRAMUSCULAR | Status: DC | PRN
  Administered 2020-01-17 (×2): 50 ug via INTRAVENOUS

## 2020-01-17 MED ORDER — PROPOFOL 10 MG/ML IV BOLUS
INTRAVENOUS | Status: DC | PRN
  Administered 2020-01-17: 140 mg via INTRAVENOUS

## 2020-01-17 MED ORDER — ACETAMINOPHEN 500 MG PO TABS: 1000.0000 mg | ORAL_TABLET | Freq: Once | ORAL | Status: DC | PRN

## 2020-01-17 MED ORDER — 0.9 % SODIUM CHLORIDE (POUR BTL) OPTIME
TOPICAL | Status: DC | PRN
  Administered 2020-01-17: 1000 mL

## 2020-01-17 MED ORDER — PROPOFOL 10 MG/ML IV BOLUS
INTRAVENOUS | Status: AC
  Filled 2020-01-17: qty 40

## 2020-01-17 MED ORDER — DEXAMETHASONE SODIUM PHOSPHATE 4 MG/ML IJ SOLN: 4.0000 mg | INTRAMUSCULAR | Status: DC

## 2020-01-17 MED ORDER — OXYTOCIN 10 UNIT/ML IJ SOLN
INTRAMUSCULAR | Status: AC
  Filled 2020-01-17: qty 4

## 2020-01-17 MED ORDER — SCOPOLAMINE 1 MG/3DAYS TD PT72
1.0000 | MEDICATED_PATCH | TRANSDERMAL | Status: DC
  Administered 2020-01-17: 1.5 mg via TRANSDERMAL
  Filled 2020-01-17: qty 1

## 2020-01-17 MED ORDER — ONDANSETRON HCL 4 MG/2ML IJ SOLN
INTRAMUSCULAR | Status: DC | PRN
  Administered 2020-01-17: 4 mg via INTRAVENOUS

## 2020-01-17 MED ORDER — GLYCOPYRROLATE PF 0.2 MG/ML IJ SOSY
PREFILLED_SYRINGE | INTRAMUSCULAR | Status: AC
  Filled 2020-01-17: qty 1

## 2020-01-17 MED ORDER — GLYCOPYRROLATE 0.2 MG/ML IJ SOLN
INTRAMUSCULAR | Status: DC | PRN
  Administered 2020-01-17: .2 mg via INTRAVENOUS

## 2020-01-17 MED ORDER — CELECOXIB 200 MG PO CAPS
400.0000 mg | ORAL_CAPSULE | ORAL | Status: AC
  Administered 2020-01-17: 400 mg via ORAL
  Filled 2020-01-17: qty 2

## 2020-01-17 MED ORDER — FERRIC SUBSULFATE 259 MG/GM EX SOLN
CUTANEOUS | Status: AC
  Filled 2020-01-17: qty 8

## 2020-01-17 MED ORDER — OXYCODONE HCL 5 MG/5ML PO SOLN: 5.0000 mg | Freq: Once | ORAL | Status: DC | PRN

## 2020-01-17 MED ORDER — MIDAZOLAM HCL 2 MG/2ML IJ SOLN
INTRAMUSCULAR | Status: AC
  Filled 2020-01-17: qty 2

## 2020-01-17 MED ORDER — LIDOCAINE HCL 1 % IJ SOLN
INTRAMUSCULAR | Status: DC | PRN
  Administered 2020-01-17: 10 mL

## 2020-01-17 MED ORDER — LIDOCAINE 2% (20 MG/ML) 5 ML SYRINGE
INTRAMUSCULAR | Status: DC | PRN
  Administered 2020-01-17: 100 mg via INTRAVENOUS

## 2020-01-17 MED ORDER — OXYCODONE HCL 5 MG PO TABS: 5.0000 mg | ORAL_TABLET | Freq: Once | ORAL | Status: DC | PRN

## 2020-01-17 MED ORDER — FENTANYL CITRATE (PF) 100 MCG/2ML IJ SOLN: 25.0000 ug | INTRAMUSCULAR | Status: DC | PRN

## 2020-01-17 MED ORDER — ORAL CARE MOUTH RINSE: 15.0000 mL | Freq: Once | OROMUCOSAL | Status: AC

## 2020-01-17 MED ORDER — ACETAMINOPHEN 160 MG/5ML PO SOLN: 1000.0000 mg | Freq: Once | ORAL | Status: DC | PRN

## 2020-01-17 MED ORDER — FENTANYL CITRATE (PF) 100 MCG/2ML IJ SOLN
INTRAMUSCULAR | Status: AC
  Filled 2020-01-17: qty 2

## 2020-01-17 MED ORDER — DEXAMETHASONE SODIUM PHOSPHATE 4 MG/ML IJ SOLN
INTRAMUSCULAR | Status: DC | PRN
  Administered 2020-01-17: 10 mg via INTRAVENOUS

## 2020-01-17 MED ORDER — SILVER NITRATE-POT NITRATE 75-25 % EX MISC
CUTANEOUS | Status: AC
  Filled 2020-01-17: qty 10

## 2020-01-17 MED ORDER — ACETIC ACID 5 % SOLN
Status: AC
  Filled 2020-01-17: qty 50

## 2020-01-17 MED ORDER — MIDAZOLAM HCL 5 MG/5ML IJ SOLN
INTRAMUSCULAR | Status: DC | PRN
  Administered 2020-01-17: 2 mg via INTRAVENOUS

## 2020-01-17 SURGICAL SUPPLY — 51 items
BACTOSHIELD CHG 4% 4OZ (MISCELLANEOUS)
BIPOLAR CUTTING LOOP 21FR (ELECTRODE)
BLADE SURG SZ11 CARB STEEL (BLADE) ×3 IMPLANT
CANISTER SUCT 3000ML PPV (MISCELLANEOUS) IMPLANT
CATH ROBINSON RED A/P 16FR (CATHETERS) IMPLANT
CNTNR URN SCR LID CUP LEK RST (MISCELLANEOUS) ×1 IMPLANT
CONT SPEC 4OZ STRL OR WHT (MISCELLANEOUS) ×2
COVER SURGICAL LIGHT HANDLE (MISCELLANEOUS) ×3 IMPLANT
COVER WAND RF STERILE (DRAPES) IMPLANT
DEVICE MYOSURE LITE (MISCELLANEOUS) IMPLANT
DEVICE MYOSURE REACH (MISCELLANEOUS) IMPLANT
DILATOR CANAL MILEX (MISCELLANEOUS) IMPLANT
DRAPE SHEET LG 3/4 BI-LAMINATE (DRAPES) ×6 IMPLANT
DRSG TELFA 3X8 NADH (GAUZE/BANDAGES/DRESSINGS) ×3 IMPLANT
ELECT REM PT RETURN 15FT ADLT (MISCELLANEOUS) ×3 IMPLANT
GAUZE 4X4 16PLY RFD (DISPOSABLE) ×3 IMPLANT
GLOVE BIO SURGEON STRL SZ 6 (GLOVE) ×6 IMPLANT
GLOVE BIO SURGEON STRL SZ 6.5 (GLOVE) IMPLANT
GLOVE BIO SURGEONS STRL SZ 6.5 (GLOVE)
GOWN STRL REUS W/ TWL LRG LVL3 (GOWN DISPOSABLE) ×1 IMPLANT
GOWN STRL REUS W/TWL LRG LVL3 (GOWN DISPOSABLE) ×5 IMPLANT
IV NS IRRIG 3000ML ARTHROMATIC (IV SOLUTION) ×3 IMPLANT
KIT BASIN OR (CUSTOM PROCEDURE TRAY) ×3 IMPLANT
KIT PROCEDURE FLUENT (KITS) IMPLANT
KIT TURNOVER KIT A (KITS) ×3 IMPLANT
LOOP CUTTING BIPOLAR 21FR (ELECTRODE) IMPLANT
MYOSURE XL FIBROID REM (MISCELLANEOUS)
NEEDLE SPNL 22GX3.5 QUINCKE BK (NEEDLE) ×3 IMPLANT
PACK LITHOTOMY IV (CUSTOM PROCEDURE TRAY) ×3 IMPLANT
PACK VAGINAL MINOR WOMEN LF (CUSTOM PROCEDURE TRAY) ×3 IMPLANT
PAD OB MATERNITY 4.3X12.25 (PERSONAL CARE ITEMS) IMPLANT
PAD PREP 24X48 CUFFED NSTRL (MISCELLANEOUS) ×3 IMPLANT
PENCIL SMOKE EVACUATOR (MISCELLANEOUS) IMPLANT
PIPET BIOPSY ENDOMETRIAL 3MM (SUCTIONS) ×3 IMPLANT
SCOPETTES 8  STERILE (MISCELLANEOUS) ×2
SCOPETTES 8 STERILE (MISCELLANEOUS) ×1 IMPLANT
SCRUB CHG 4% DYNA-HEX 4OZ (MISCELLANEOUS) IMPLANT
SEAL ROD LENS SCOPE MYOSURE (ABLATOR) IMPLANT
SPONGE LAP 18X18 RF (DISPOSABLE) IMPLANT
SPONGE SURGIFOAM ABS GEL 12-7 (HEMOSTASIS) IMPLANT
SUT VIC AB 0 CT1 27 (SUTURE)
SUT VIC AB 0 CT1 27XBRD ANTBC (SUTURE) IMPLANT
SUT VIC AB 2-0 UR6 27 (SUTURE) IMPLANT
SUT VICRYL 0 UR6 27IN ABS (SUTURE) IMPLANT
SYR BULB IRRIG 60ML STRL (SYRINGE) ×3 IMPLANT
SYR CONTROL 10ML LL (SYRINGE) ×3 IMPLANT
SYSTEM TISS REMOVAL MYSR XL RM (MISCELLANEOUS) IMPLANT
TOWEL OR 17X26 10 PK STRL BLUE (TOWEL DISPOSABLE) ×3 IMPLANT
TOWEL OR NON WOVEN STRL DISP B (DISPOSABLE) ×3 IMPLANT
UNDERPAD 30X36 HEAVY ABSORB (UNDERPADS AND DIAPERS) ×3 IMPLANT
WATER STERILE IRR 500ML POUR (IV SOLUTION) IMPLANT

## 2020-01-17 NOTE — Anesthesia Procedure Notes (Signed)
Procedure Name: LMA Insertion Date/Time: 01/17/2020 8:42 AM Performed by: Lavina Hamman, CRNA Pre-anesthesia Checklist: Patient identified, Emergency Drugs available, Suction available and Patient being monitored Patient Re-evaluated:Patient Re-evaluated prior to induction Oxygen Delivery Method: Circle System Utilized Preoxygenation: Pre-oxygenation with 100% oxygen Induction Type: IV induction Ventilation: Mask ventilation without difficulty LMA: LMA inserted LMA Size: 4.0 Number of attempts: 1 Airway Equipment and Method: Bite block Placement Confirmation: positive ETCO2 Tube secured with: Tape Dental Injury: Teeth and Oropharynx as per pre-operative assessment

## 2020-01-17 NOTE — Anesthesia Preprocedure Evaluation (Addendum)
Anesthesia Evaluation  Patient identified by MRN, date of birth, ID band Patient awake    Reviewed: Allergy & Precautions, NPO status , Patient's Chart, lab work & pertinent test results  History of Anesthesia Complications Negative for: history of anesthetic complications  Airway Mallampati: II  TM Distance: >3 FB Neck ROM: Full    Dental  (+) Missing, Dental Advisory Given, Teeth Intact,    Pulmonary Current Smoker and Patient abstained from smoking., former smoker,  Covid-19 Nucleic Acid Test Results Lab Results      Component                Value               Date                      [place]              NEGATIVE            01/13/2020              breath sounds clear to auscultation       Cardiovascular negative cardio ROS   Rhythm:Regular     Neuro/Psych negative neurological ROS  negative psych ROS   GI/Hepatic Neg liver ROS, GERD  Medicated and Controlled,  Endo/Other  negative endocrine ROSNo results found for: HGBA1C   Renal/GU Lab Results      Component                Value               Date                      CREATININE               0.70                01/12/2020                Musculoskeletal negative musculoskeletal ROS (+)   Abdominal   Peds  Hematology negative hematology ROS (+) Lab Results      Component                Value               Date                      WBC                      6.8                 01/15/2020                HGB                      12.4                01/15/2020                HCT                      37.7                01/15/2020                MCV  96.4                01/15/2020                PLT                      299                 01/15/2020              Anesthesia Other Findings   Reproductive/Obstetrics                            Anesthesia Physical Anesthesia Plan  ASA: II  Anesthesia Plan:  General   Post-op Pain Management:    Induction: Intravenous  PONV Risk Score and Plan: 3 and Ondansetron, Dexamethasone and Propofol infusion  Airway Management Planned: LMA  Additional Equipment: None  Intra-op Plan:   Post-operative Plan: Extubation in OR  Informed Consent: I have reviewed the patients History and Physical, chart, labs and discussed the procedure including the risks, benefits and alternatives for the proposed anesthesia with the patient or authorized representative who has indicated his/her understanding and acceptance.     Dental advisory given  Plan Discussed with: CRNA and Surgeon  Anesthesia Plan Comments:         Anesthesia Quick Evaluation

## 2020-01-17 NOTE — Interval H&P Note (Signed)
History and Physical Interval Note:  01/17/2020 7:50 AM  Madeline Howard  has presented today for surgery, with the diagnosis of [place].  The various methods of treatment have been discussed with the patient and family. After consideration of risks, benefits and other options for treatment, the patient has consented to  Procedure(s): EXAM UNDER ANESTHESIA (N/A) DILATATION & CURETTAGE/HYSTEROSCOPY WITH MYOSURE (N/A) CONIZATION CERVIX WITH BIOPSY (N/A) as a surgical intervention.  The patient's history has been reviewed, patient examined, no change in status, stable for surgery.  I have reviewed the patient's chart and labs.  Questions were answered to the patient's satisfaction.     [name]

## 2020-01-17 NOTE — Discharge Instructions (Signed)
During the exam under anesthesia, Dr. Berline Lopes took biopsies of the cervix and lining of the uterus. She will contact you with the results. You can use Tylenol or ibuprofen for discomfort if needed.  Cervical Biopsy, Care After  This sheet gives you information about how to care for yourself after your procedure. Your health care provider may also give you more specific instructions. If you have problems or questions, contact your health care provider. What can I expect after the procedure? After the procedure, it is common to have:  Cramping or mild pain for a few days.  Slight bleeding from the vagina for a few days.  Dark-colored vaginal discharge for a few days. Follow these instructions at home: Lifestyle  Do not douche until your health care provider approves.  Do not use tampons until your health care provider approves.  Do not have sex until your health care provider approves.  Return to your normal activities as told by your health care provider. Ask your health care provider what activities are safe for you. General instructions   Take over-the-counter and prescription medicines only as told by your health care provider.  Use a sanitary napkin until bleeding and discharge stop.  Keep all follow-up visits as told by your health care provider. This is important. Contact a health care provider if you have:  A fever or chills.  Bad-smelling vaginal discharge.  Itching or irritation around the vagina.  Lower abdominal pain. Get help right away if you:  Develop heavy vaginal bleeding that soaks more than one sanitary napkin an hour.  Faint.  Have severe pain in your lower abdomen. Summary  After the procedure, it is normal to have cramps, slight bleeding, and dark-colored discharge from the vagina.  After you return home, do not douche, have sex, or use a tampon until your health care provider approves.  Take over-the-counter and prescription medicines only as  told by your health care provider.  Get help right away if you develop heavy bleeding, you faint, or you have severe pain in your lower abdomen. This information is not intended to replace advice given to you by your health care provider. Make sure you discuss any questions you have with your health care provider. Document Revised: 07/01/2017 Document Reviewed: 07/01/2017 Elsevier Patient Education  [place].   Endometrial Biopsy, Care After  This sheet gives you information about how to care for yourself after your procedure. Your health care provider may also give you more specific instructions. If you have problems or questions, contact your health care provider. What can I expect after the procedure? After the procedure, it is common to have:  Mild cramping.  A small amount of vaginal bleeding for a few days. This is normal. Follow these instructions at home:   Take over-the-counter and prescription medicines only as told by your health care provider.  Do not douche, use tampons, or have sexual intercourse until your health care provider approves.  Return to your normal activities as told by your health care provider. Ask your health care provider what activities are safe for you.  Follow instructions from your health care provider about any activity restrictions, such as restrictions on strenuous exercise or heavy lifting. Contact a health care provider if:  You have heavy bleeding, or bleed for longer than 2 days after the procedure.  You have bad smelling discharge from your vagina.  You have a fever or chills.  You have a burning sensation when urinating or you  have difficulty urinating.  You have severe pain in your lower abdomen. Get help right away if:  You have severe cramps in your stomach or back.  You pass large blood clots.  Your bleeding increases.  You become weak or light-headed, or you pass out. Summary  After the procedure, it is common  to have mild cramping and a small amount of vaginal bleeding for a few days.  Do not douche, use tampons, or have sexual intercourse until your health care provider approves.  Return to your normal activities as told by your health care provider. Ask your health care provider what activities are safe for you. This information is not intended to replace advice given to you by your health care provider. Make sure you discuss any questions you have with your health care provider. Document Revised: 01/08/2017 Document Reviewed: 02/12/2016 Elsevier Patient Education  [place].

## 2020-01-17 NOTE — Op Note (Signed)
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 Cervix is flush with the vagina posteriorly; lesion abuts the cervico-vaginal junction. Very gritty endocervical canal noted on ECC. EMB revealed somewhat purulent appearing blood. On rectovaginal exam, concerning for thickening posteriorly in the midline and to the left, concerning for parametria involvement.  Procedure: The patient was identified in the preoperative holding area. Informed consent was signed on the chart. Patient was seen history was reviewed and exam was performed.   The patient was then taken to the operating room and placed in the supine position with SCD hose on. General anesthesia was then induced without difficulty. She was then placed in the dorsolithotomy position. The perineum was prepped with Betadine. The vagina was prepped with Betadine. The patient was then draped after the prep was dried.   Timeout was performed the patient, procedure, antibiotic, allergy, and length of procedure.   The speculum was placed in the vagina and the cervix was well visualized after EUA performed. Given exam findings, decision made to abort plan for CKC (felt to be unnecessary). Multiple biopsies taken along posterior aspect of the cervix, along the vaginal junction. ECC and EMB also performed. Monopolar electrocautery  used to achieve hemostasis of the biopsy sites.    The vagina was irrigated.  All instrument, suture, laparotomy, Ray-Tec, and needle counts were correct x2.  The patient tolerated the procedure well and was taken recovery room in stable condition.  Jeral Pinch MD Gynecologic Oncology

## 2020-01-17 NOTE — Transfer of Care (Signed)
Immediate Anesthesia Transfer of Care Note  Patient: Madeline Howard  Procedure(s) Performed: Procedure(s): EXAM UNDER ANESTHESIA, CERVICAL BIPOSIES, ENDOMETRIAL CURETTINGS (N/A)  Patient Location: PACU  Anesthesia Type:General  Level of Consciousness:  sedated, patient cooperative and responds to stimulation  Airway & Oxygen Therapy:Patient Spontanous Breathing and Patient connected to face mask oxgen  Post-op Assessment:  Report given to PACU RN and Post -op Vital signs reviewed and stable  Post vital signs:  Reviewed and stable  Last Vitals:  Vitals:   01/17/20 0709  BP: (!) 172/77  Pulse: 69  Resp: 16  Temp: 36.7 C  SpO2: 23%    Complications: No apparent anesthesia complications

## 2020-01-18 ENCOUNTER — Telehealth: Payer: Self-pay

## 2020-01-18 LAB — SURGICAL PATHOLOGY

## 2020-01-18 NOTE — Telephone Encounter (Signed)
Madeline Howard states that she is eating,drinking, and urinating well. Passing gas. Afebrile. She is having light vaginal discharge. Pt aware of post op appointments as well as the office number [phone] and after hours number [phone] to call if she has any questions or concerns

## 2020-01-19 ENCOUNTER — Telehealth: Payer: Self-pay | Admitting: Gynecologic Oncology

## 2020-01-19 ENCOUNTER — Encounter: Payer: Self-pay | Admitting: Gynecologic Oncology

## 2020-01-19 ENCOUNTER — Other Ambulatory Visit: Payer: Self-pay | Admitting: Gynecologic Oncology

## 2020-01-19 DIAGNOSIS — C539 Malignant neoplasm of cervix uteri, unspecified: Secondary | ICD-10-CM

## 2020-01-19 NOTE — Progress Notes (Signed)
Radiation and chemotherapy referrals placed for chemoradiation for cervical cancer.

## 2020-01-19 NOTE — Progress Notes (Signed)
On recent cervical biopsies taken in the operating room on 01/17/20, the grade of the cervical carcinoma is well to moderately differentiated per Dr. Tresa Moore with pathology.

## 2020-01-19 NOTE — Anesthesia Postprocedure Evaluation (Signed)
Anesthesia Post Note  Patient: Madeline Howard  Procedure(s) Performed: EXAM UNDER ANESTHESIA, CERVICAL BIPOSIES, ENDOMETRIAL CURETTINGS (N/A )     Patient location during evaluation: PACU Anesthesia Type: General Level of consciousness: awake and alert Pain management: pain level controlled Vital Signs Assessment: post-procedure vital signs reviewed and stable Respiratory status: spontaneous breathing, nonlabored ventilation, respiratory function stable and patient connected to nasal cannula oxygen Cardiovascular status: blood pressure returned to baseline and stable Postop Assessment: no apparent nausea or vomiting Anesthetic complications: no   No complications documented.  Last Vitals:  Vitals:   01/17/20 1000 01/17/20 1022  BP: 133/81 (!) 154/76  Pulse: 67   Resp: 13 16  Temp:  36.5 C  SpO2: 98% 100%    Last Pain:  Vitals:   01/17/20 1022  TempSrc:   PainSc: 0-No pain                 Deren Degrazia

## 2020-01-19 NOTE — Telephone Encounter (Signed)
I called the patient and discussed biopsy results from her surgery earlier this week.  Given exam findings under anesthesia, I did not proceed with cold knife cone but rather took multiple biopsies, performed an ECC and an endometrial biopsy.  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  We are reaching out to pathology for a grade.  Biopsies did not show lymphovascular space invasion.  Based on the appearance of her cervix, there is complete obliteration of the cervicovaginal junction posteriorly and I suspect that despite biopsy showing high-grade dysplasia, that her cancerous lesion extends to the upper aspect of the vagina.  Patient is scheduled for a CT scan next week, on 12/17.  This was ordered given her insurance (Medicaid).  I discussed plan for referral to radiation oncology and medical call oncology.  Patient amenable and ready to proceed with definitive treatment.  Jeral Pinch MD Gynecologic Oncology

## 2020-01-22 NOTE — Telephone Encounter (Signed)
Thanks, she is scheduled.

## 2020-01-23 ENCOUNTER — Telehealth: Payer: Self-pay | Admitting: Oncology

## 2020-01-23 NOTE — Telephone Encounter (Signed)
Madeline Howard and reviewed her upcoming appointments.  She does not have any questions at this time.  Gave her my contact information and encouraged her to call with any questions or needs.

## 2020-01-26 ENCOUNTER — Ambulatory Visit (HOSPITAL_COMMUNITY)
Admission: RE | Admit: 2020-01-26 | Discharge: 2020-01-26 | Disposition: A | Discharge status: Home / Self Care | Payer: Medicaid Other | Source: Ambulatory Visit | Attending: Gynecologic Oncology | Admitting: Gynecologic Oncology

## 2020-01-26 ENCOUNTER — Other Ambulatory Visit: Payer: Self-pay

## 2020-01-26 DIAGNOSIS — N950 Postmenopausal bleeding: Secondary | ICD-10-CM | POA: Insufficient documentation

## 2020-01-26 DIAGNOSIS — C539 Malignant neoplasm of cervix uteri, unspecified: Secondary | ICD-10-CM | POA: Diagnosis present

## 2020-01-26 MED ORDER — IOHEXOL 300 MG/ML  SOLN
100.0000 mL | Freq: Once | INTRAMUSCULAR | Status: AC | PRN
  Administered 2020-01-26: 16:00:00 100 mL via INTRAVENOUS

## 2020-01-29 ENCOUNTER — Inpatient Hospital Stay (HOSPITAL_BASED_OUTPATIENT_CLINIC_OR_DEPARTMENT_OTHER): Payer: Medicaid Other | Admitting: Hematology and Oncology

## 2020-01-29 ENCOUNTER — Other Ambulatory Visit: Payer: Self-pay

## 2020-01-29 ENCOUNTER — Telehealth: Payer: Self-pay | Admitting: Oncology

## 2020-01-29 ENCOUNTER — Encounter: Payer: Self-pay | Admitting: Hematology and Oncology

## 2020-01-29 ENCOUNTER — Other Ambulatory Visit: Payer: Self-pay | Admitting: Gynecologic Oncology

## 2020-01-29 ENCOUNTER — Telehealth: Payer: Self-pay | Admitting: Hematology and Oncology

## 2020-01-29 VITALS — BP 175/76 | HR 77 | Temp 98.9°F | Resp 18 | Ht 67.0 in | Wt 159.1 lb

## 2020-01-29 DIAGNOSIS — N950 Postmenopausal bleeding: Secondary | ICD-10-CM | POA: Diagnosis not present

## 2020-01-29 DIAGNOSIS — Z23 Encounter for immunization: Secondary | ICD-10-CM | POA: Diagnosis not present

## 2020-01-29 DIAGNOSIS — Z299 Encounter for prophylactic measures, unspecified: Secondary | ICD-10-CM

## 2020-01-29 DIAGNOSIS — C539 Malignant neoplasm of cervix uteri, unspecified: Secondary | ICD-10-CM | POA: Diagnosis not present

## 2020-01-29 DIAGNOSIS — R030 Elevated blood-pressure reading, without diagnosis of hypertension: Secondary | ICD-10-CM | POA: Diagnosis not present

## 2020-01-29 DIAGNOSIS — Z7289 Other problems related to lifestyle: Secondary | ICD-10-CM | POA: Insufficient documentation

## 2020-01-29 MED ORDER — INFLUENZA VAC SPLIT QUAD 0.5 ML IM SUSY
0.5000 mL | PREFILLED_SYRINGE | Freq: Once | INTRAMUSCULAR | Status: AC
  Administered 2020-01-29: 15:00:00 0.5 mL via INTRAMUSCULAR

## 2020-01-29 MED ORDER — ONDANSETRON HCL 8 MG PO TABS: 8.0000 mg | ORAL_TABLET | Freq: Three times a day (TID) | ORAL | 1 refills | Status: DC | PRN

## 2020-01-29 MED ORDER — PROCHLORPERAZINE MALEATE 10 MG PO TABS: 10.0000 mg | ORAL_TABLET | Freq: Four times a day (QID) | ORAL | 1 refills | Status: DC | PRN

## 2020-01-29 MED ORDER — INFLUENZA VAC SPLIT QUAD 0.5 ML IM SUSY
PREFILLED_SYRINGE | INTRAMUSCULAR | Status: AC
  Filled 2020-01-29: qty 0.5

## 2020-01-29 MED ORDER — LIDOCAINE-PRILOCAINE 2.5-2.5 % EX CREA: TOPICAL_CREAM | CUTANEOUS | 3 refills | Status: DC

## 2020-01-29 NOTE — Assessment & Plan Note (Signed)
I encouraged her to discontinue vaping due to risk of infection while on treatment

## 2020-01-29 NOTE — Assessment & Plan Note (Signed)
I recommend discontinuation of Excedrin, fish oil and ibuprofen as all these medications can increase risk of bleeding Her recent blood count did not suggest evidence of anemia

## 2020-01-29 NOTE — Telephone Encounter (Signed)
Called Madeline Howard and advised her of results from the CT scan and that Dr. Berline Lopes would like her to have additional imaging (MRI).  Advised her of appointment for MRI on 02/05/20 at 7:30 pm.  She verbalized understanding and agreement.

## 2020-01-29 NOTE — Progress Notes (Signed)
Patient here for a consult with Dr. Sondra Come.  GYN Location of Tumor / Histology: Cervical  Madeline Howard presented with symptoms of: Postmenopausal bleeding in Nov. 2021  Biopsies revealed: 01/17/2020    FINAL MICROSCOPIC DIAGNOSIS:   A. VAGINOCERVICAL JUNCTION, 6:00, BIOPSY:  - High grade squamous intraepithelial lesion, CIN-II/VAIN-II (moderate  dysplasia).   B. VAGINOCERVICAL JUNCTION, 4:00, BIOPSY:  - High grade squamous intraepithelial lesion, CIN-III/VAIN-III (severe  dysplasia/CIS).   C. VAGINOCERVICAL JUNCTION, 12:00, BIOPSY:  - Invasive squamous cell carcinoma, see comment.   D. ENDOMETRIUM, CURETTAGE:  - Predominately blood with fragments of high grade squamous  intraepithelial lesion.   E. ENDOCERVIX, CURETTAGE:  - Invasive squamous cell carcinoma, see comment  Past/Anticipated interventions by Gyn/Onc surgery, if any: no  Past/Anticipated interventions by medical oncology, if any: yes  Weight changes, if any: lost 4 lbs   Bowel/Bladder complaints, if any: no  Nausea/Vomiting, if any: no  Pain issues, if any:  Intermittent pelvic pain  SAFETY ISSUES:  Prior radiation? no  Pacemaker/ICD? no  Possible current pregnancy? Postmenopausal  Is the patient on methotrexate? no  Current Complaints / other details:  Would like chemo and radiation in [place].  BP (!) 174/84 (BP Location: Left Arm, Patient Position: Sitting, Cuff Size: Normal)   Pulse 73   Temp 97.7 F (36.5 C)   Resp 18   Ht 5\' 7"  (1.702 m)   Wt 158 lb 12.8 oz (72 kg)   SpO2 100%   BMI 24.87 kg/m   Wt Readings from Last 3 Encounters:  02/07/20 158 lb 12.8 oz (72 kg)  01/29/20 159 lb 1.6 oz (72.2 kg)  01/17/20 159 lb 1 oz (72.1 kg)

## 2020-01-29 NOTE — Assessment & Plan Note (Signed)
We discussed the importance of preventive care and reviewed the vaccination programs. She does not have any prior allergic reactions to influenza vaccination. She agrees to proceed with influenza vaccination today and we will administer it today at the clinic.  

## 2020-01-29 NOTE — Progress Notes (Signed)
START OFF PATHWAY REGIMEN - Other   [id number]:Cisplatin 40 mg/m2 IV D1 q7 Days + RT:   A cycle is every 7 days:     Cisplatin   **Always confirm dose/schedule in your pharmacy ordering system**  Patient Characteristics: Intent of Therapy: Curative Intent, Discussed with Patient 

## 2020-01-29 NOTE — Telephone Encounter (Signed)
Scheduled appts per 12/20 sch msg. Gave pt a print out of AVS.

## 2020-01-29 NOTE — Assessment & Plan Note (Signed)
This is likely exacerbated by anxiety Observe closely for now

## 2020-01-29 NOTE — Assessment & Plan Note (Signed)
I have reviewed her imaging study and plan of care with GYN oncologist She has appointment scheduled for MRI of the pelvis and to see radiation oncologist The tentative plan would be to proceed with concurrent chemoradiation therapy with weekly cisplatin  We discussed the role of chemotherapy. The intent is of curative intent.  We discussed some of the risks, benefits, side-effects of cisplatin and its role as chemo sensitizing agent. The plan for weekly cisplatin for x5-6 doses along with radiation treatment.  Some of the short term side-effects included, though not limited to, including weight loss, life threatening infections, risk of allergic reactions, need for transfusions of blood products, nausea, vomiting, change in bowel habits, loss of hair, admission to hospital for various reasons, and risks of death.   Long term side-effects are also discussed including risks of infertility, permanent damage to nerve function, hearing loss, chronic fatigue, kidney damage with possibility needing hemodialysis, and rare secondary malignancy including bone marrow disorders.  The patient is aware that the response rates discussed earlier is not guaranteed.  After a long discussion, patient made an informed decision to proceed with the prescribed plan of care.   Patient education material was dispensed.  Discussed port placement, blood work and chemo education class I will tentatively schedule her to start chemotherapy within the first week of January I will follow on imaging test results I will see her before we start treatment

## 2020-01-29 NOTE — Progress Notes (Signed)
[place] CONSULT NOTE  Patient Care Team: Patient, No Pcp Per as PCP - General (General Practice) Madeline Howard, Craige Cotta, RN as Oncology Nurse Navigator (Oncology)  ASSESSMENT & PLAN:  Malignant neoplasm of cervix [facility]) I have reviewed her imaging study and plan of care with GYN oncologist She has appointment scheduled for MRI of the pelvis and to see radiation oncologist The tentative plan would be to proceed with concurrent chemoradiation therapy with weekly cisplatin  We discussed the role of chemotherapy. The intent is of curative intent.  We discussed some of the risks, benefits, side-effects of cisplatin and its role as chemo sensitizing agent. The plan for weekly cisplatin for x5-6 doses along with radiation treatment.  Some of the short term side-effects included, though not limited to, including weight loss, life threatening infections, risk of allergic reactions, need for transfusions of blood products, nausea, vomiting, change in bowel habits, loss of hair, admission to hospital for various reasons, and risks of death.   Long term side-effects are also discussed including risks of infertility, permanent damage to nerve function, hearing loss, chronic fatigue, kidney damage with possibility needing hemodialysis, and rare secondary malignancy including bone marrow disorders.  The patient is aware that the response rates discussed earlier is not guaranteed.  After a long discussion, patient made an informed decision to proceed with the prescribed plan of care.   Patient education material was dispensed.  Discussed port placement, blood work and chemo education class I will tentatively schedule her to start chemotherapy within the first week of January I will follow on imaging test results I will see her before we start treatment   Elevated BP without diagnosis of hypertension This is likely exacerbated by anxiety Observe closely for now  PMB (postmenopausal  bleeding) I recommend discontinuation of Excedrin, fish oil and ibuprofen as all these medications can increase risk of bleeding Her recent blood count did not suggest evidence of anemia  Preventive measure We discussed the importance of preventive care and reviewed the vaccination programs. She does not have any prior allergic reactions to influenza vaccination. She agrees to proceed with influenza vaccination today and we will administer it today at the clinic.   Current every day vaping I encouraged her to discontinue vaping due to risk of infection while on treatment   Orders Placed This Encounter  Procedures  . IR IMAGING GUIDED PORT INSERTION    Standing Status:   Future    Standing Expiration Date:   01/28/2021    Order Specific Question:   Reason for Exam (SYMPTOM  OR DIAGNOSIS REQUIRED)    Answer:   need port for chemo to start 1/7    Order Specific Question:   Is the patient pregnant?    Answer:   No    Order Specific Question:   Preferred Imaging Location?    Answer:   [facility]  . CBC with Differential ([place] Only)    Standing Status:   Standing    Number of Occurrences:   20    Standing Expiration Date:   01/28/2021  . Basic Metabolic Panel - [place] Only    Standing Status:   Standing    Number of Occurrences:   20    Standing Expiration Date:   01/28/2021  . Magnesium    Standing Status:   Standing    Number of Occurrences:   20    Standing Expiration Date:   01/28/2021    The total  time spent in the appointment was 60 minutes encounter with patients including review of chart and various tests results, discussions about plan of care and coordination of care plan   All questions were answered. The patient knows to call the clinic with any problems, questions or concerns. No barriers to learning was detected.  Heath Lark, MD 12/20/20213:24 PM  CHIEF COMPLAINTS/PURPOSE OF CONSULTATION:  Cervical cancer, for further  management  HISTORY OF PRESENTING ILLNESS:  Madeline Howard 54 y.o. female is here because of recent diagnosis of cervical cancer She is here alone by herself She developed menopause approximately 2 years ago and then developed postmenopausal bleeding She has extensive evaluation including imaging study and biopsy that confirmed diagnosis of cervical cancer I have reviewed her chart and materials related to her cancer extensively and collaborated history with the patient. Summary of oncologic history is as follows: Oncology History  Malignant neoplasm of cervix ([place])  12/11/2019 Initial Diagnosis   She presented with postmenopausal bleeding   01/12/2020 Initial Diagnosis   Malignant neoplasm of cervix ([place])   01/12/2020 Pathology Results   FINAL MICROSCOPIC DIAGNOSIS:   A. CERVIX, 3:00, 5:00, BIOPSY:  -  Invasive squamous cell carcinoma arising in a background of high-grade dysplasia  -  See comment   B.  ENDOCERVIX, CURETTAGE:  -  High grade squamous intraepithelial lesion (CIN 3; severe dysplasia/carcinoma in situ)    01/17/2020 Pathology Results   FINAL MICROSCOPIC DIAGNOSIS:   A. VAGINOCERVICAL JUNCTION, 6:00, BIOPSY:  - High grade squamous intraepithelial lesion, CIN-II/VAIN-II (moderate dysplasia).   B. VAGINOCERVICAL JUNCTION, 4:00, BIOPSY:  - High grade squamous intraepithelial lesion, CIN-III/VAIN-III (severe dysplasia/CIS).   C. VAGINOCERVICAL JUNCTION, 12:00, BIOPSY:  - Invasive squamous cell carcinoma, see comment.   D. ENDOMETRIUM, CURETTAGE:  - Predominately blood with fragments of high grade squamous intraepithelial lesion.   E. ENDOCERVIX, CURETTAGE:  - Invasive squamous cell carcinoma, see comment   01/17/2020 Surgery   Postoperative Diagnosis: Clinical stage IIB disease due to parametria thickening   Surgery: EUA, multiple cervical biopsies, ECC, EMB    Surgeons:  Jeral Pinch MD  Pathology: Biopsies at 4 and 6  o'c at the posterior cervicovaginal  junction, biopsy from anterior cervix at 7 o'c, ECC, EMB   Operative findings: Cervix barrel shaped and firm, visible lesion that is friable spanning most of the posterior cervix (approximately 2 x 3.5cm). Cervix is flush with the vagina posteriorly; lesion abuts the cervico-vaginal junction. Very gritty endocervical canal noted on ECC. EMB revealed somewhat purulent appearing blood. On rectovaginal exam, concerning for thickening posteriorly in the midline and to the left, concerning for parametria involvement.   01/26/2020 Cancer Staging   Staging form: Cervix Uteri, AJCC Version 9 - Clinical stage from 01/26/2020: FIGO Stage IIB (cT2b, cN0, cM0) - Signed by Heath Lark, MD on 01/29/2020   01/26/2020 Imaging   1. Known cervical malignancy is not well appreciated. There is no evidence of lymphadenopathy or metastatic disease in the abdomen or pelvis. 2. Probable fibroid of the anterior uterine body, not well delineated by CT.   Aortic Atherosclerosis (ICD10-I70.0).     02/16/2020 -  Chemotherapy   The patient had palonosetron (ALOXI) injection 0.25 mg, 0.25 mg, Intravenous,  Once, 0 of 6 cycles CISplatin (PLATINOL) 74 mg in sodium chloride 0.9 % 250 mL chemo infusion, 40 mg/m2 = 74 mg, Intravenous,  Once, 0 of 6 cycles fosaprepitant (EMEND) 150 mg in sodium chloride 0.9 % 145 mL IVPB, 150 mg, Intravenous,  Once,  0 of 6 cycles  for chemotherapy treatment.     Risk factors include vaping No recent cigarette smoking She is otherwise healthy She was told she have intermittent blood pressure especially when she sees a physician but denies diagnosis of hypertension  MEDICAL HISTORY:  Past Medical History:  Diagnosis Date  . Allergy   . GERD (gastroesophageal reflux disease)     SURGICAL HISTORY: History reviewed. No pertinent surgical history.  SOCIAL HISTORY: Social History   Socioeconomic History  . Marital status: Significant Other    Spouse name: Toy Baker  . Number of  children: 2  . Years of education: Not on file  . Highest education level: Not on file  Occupational History  . Occupation: unemployed    Comment: Psychologist, educational  Tobacco Use  . Smoking status: Former Smoker    Packs/day: 1.50    Years: 15.00    Pack years: 22.50    Types: Cigarettes, E-cigarettes    Quit date: 07/15/2016    Years since quitting: 3.5  . Smokeless tobacco: Never Used  Vaping Use  . Vaping Use: Every day  . Start date: 07/15/2016  . Substances: Nicotine, Flavoring  Substance and Sexual Activity  . Alcohol use: Never  . Drug use: Never  . Sexual activity: Yes    Birth control/protection: None  Other Topics Concern  . Not on file  Social History Narrative  . Not on file   Social Determinants of Health   Financial Resource Strain: Not on file  Food Insecurity: Not on file  Transportation Needs: Not on file  Physical Activity: Not on file  Stress: Not on file  Social Connections: Not on file  Intimate Partner Violence: Not on file    FAMILY HISTORY: Family History  Problem Relation Age of Onset  . Cancer Mother        colon  . Diabetes Father   . Cancer Brother        melanoma  . Breast cancer Neg Hx   . Uterine cancer Neg Hx   . Ovarian cancer Neg Hx     ALLERGIES:  has No Known Allergies.  MEDICATIONS:  Current Outpatient Medications  Medication Sig Dispense Refill  . Chlorpheniramine Maleate (ALLERGY PO) Take 1 tablet by mouth daily.     . famotidine (PEPCID) 20 MG tablet Take 20 mg by mouth daily. OTC    . lidocaine-prilocaine (EMLA) cream Apply to affected area once 30 g 3  . Multiple Vitamin (MULTIVITAMIN) capsule Take 1 capsule by mouth daily.    . ondansetron (ZOFRAN) 8 MG tablet Take 1 tablet (8 mg total) by mouth every 8 (eight) hours as needed. Start on the third day after cisplatin chemotherapy. 30 tablet 1  . prochlorperazine (COMPAZINE) 10 MG tablet Take 1 tablet (10 mg total) by mouth every 6 (six) hours as needed (Nausea or  vomiting). 30 tablet 1   No current facility-administered medications for this visit.    REVIEW OF SYSTEMS:   Constitutional: Denies fevers, chills or abnormal night sweats Eyes: Denies blurriness of vision, double vision or watery eyes Ears, nose, mouth, throat, and face: Denies mucositis or sore throat Respiratory: Denies cough, dyspnea or wheezes Cardiovascular: Denies palpitation, chest discomfort or lower extremity swelling Gastrointestinal:  Denies nausea, heartburn or change in bowel habits Skin: Denies abnormal skin rashes Lymphatics: Denies new lymphadenopathy or easy bruising Neurological:Denies numbness, tingling or new weaknesses Behavioral/Psych: Mood is stable, no new changes  All other systems were  reviewed with the patient and are negative.  PHYSICAL EXAMINATION: ECOG PERFORMANCE STATUS: 1 - Symptomatic but completely ambulatory  Vitals:   01/29/20 1343 01/29/20 1344  BP: (!) 185/76 (!) 175/76  Pulse: 77   Resp: 18   Temp: 98.9 F (37.2 C)   SpO2: 100%    Filed Weights   01/29/20 1343  Weight: 159 lb 1.6 oz (72.2 kg)    GENERAL:alert, no distress and comfortable SKIN: skin color, texture, turgor are normal, no rashes or significant lesions EYES: normal, conjunctiva are pink and non-injected, sclera clear OROPHARYNX:no exudate, no erythema and lips, buccal mucosa, and tongue normal  NECK: supple, thyroid normal size, non-tender, without nodularity LYMPH:  no palpable lymphadenopathy in the cervical, axillary or inguinal LUNGS: clear to auscultation and percussion with normal breathing effort HEART: regular rate & rhythm and no murmurs and no lower extremity edema ABDOMEN:abdomen soft, non-tender and normal bowel sounds Musculoskeletal:no cyanosis of digits and no clubbing  PSYCH: alert & oriented x 3 with fluent speech NEURO: no focal motor/sensory deficits  LABORATORY DATA:  I have reviewed the data as listed Lab Results  Component Value Date   WBC  6.8 01/15/2020   HGB 12.4 01/15/2020   HCT 37.7 01/15/2020   MCV 96.4 01/15/2020   PLT 299 01/15/2020   Recent Labs    01/12/20 1456  NA 141  K 4.3  CL 106  CO2 24  GLUCOSE 92  BUN 7  CREATININE 0.70  CALCIUM 10.1  GFRNONAA >60  PROT 9.1*  ALBUMIN 3.9  AST 47*  ALT 36  ALKPHOS 66  BILITOT 0.4    RADIOGRAPHIC STUDIES: I have personally reviewed the radiological images as listed and agreed with the findings in the report. CT Abdomen Pelvis W Contrast  Result Date: 01/29/2020 CLINICAL DATA:  New diagnosis cervical cancer EXAM: CT ABDOMEN AND PELVIS WITH CONTRAST TECHNIQUE: Multidetector CT imaging of the abdomen and pelvis was performed using the standard protocol following bolus administration of intravenous contrast. CONTRAST:  134mL OMNIPAQUE IOHEXOL 300 MG/ML SOLN, additional oral enteric contrast COMPARISON:  None. FINDINGS: Lower chest: No acute abnormality. Hepatobiliary: No solid liver abnormality is seen. No gallstones, gallbladder wall thickening, or biliary dilatation. Pancreas: Unremarkable. No pancreatic ductal dilatation or surrounding inflammatory changes. Spleen: Normal in size without significant abnormality. Adrenals/Urinary Tract: Adrenal glands are unremarkable. Kidneys are normal, without renal calculi, solid lesion, or hydronephrosis. Bladder is unremarkable. Stomach/Bowel: Stomach is within normal limits. Appendix appears normal. No evidence of bowel wall thickening, distention, or inflammatory changes. Vascular/Lymphatic: Aortic atherosclerosis. No enlarged abdominal or pelvic lymph nodes. Reproductive: Probable fibroid of the anterior uterine body, not well delineated by CT (series 6, image 51). Known cervical malignancy is not well appreciated. Other: No abdominal wall hernia or abnormality. No abdominopelvic ascites. Musculoskeletal: No acute or significant osseous findings. IMPRESSION: 1. Known cervical malignancy is not well appreciated. There is no evidence  of lymphadenopathy or metastatic disease in the abdomen or pelvis. 2. Probable fibroid of the anterior uterine body, not well delineated by CT. Aortic Atherosclerosis (ICD10-I70.0). Electronically Signed   By: Eddie Candle M.D.   On: 01/29/2020 08:43

## 2020-02-05 ENCOUNTER — Other Ambulatory Visit: Payer: Self-pay | Admitting: Oncology

## 2020-02-05 ENCOUNTER — Ambulatory Visit (HOSPITAL_COMMUNITY): Payer: Medicaid Other

## 2020-02-05 NOTE — Progress Notes (Signed)
Gynecologic Oncology Multi-Disciplinary Disposition Conference Note  Date of the Conference: 02/05/2020  Patient Name: Madeline Howard  Referring Provider: Dr. Georgian Co Primary GYN Oncologist: Dr. Pricilla Holm  Stage/Disposition:  Stage IIB squamous cell carcinoma of the cervix. Disposition is for an MRI followed by definitive treatment with radiation and chemotherapy or consideration for surgery.   This Multidisciplinary conference took place involving physicians from Gynecologic Oncology, Medical Oncology, Radiation Oncology, Pathology, Radiology along with the Gynecologic Oncology Nurse Practitioner and RN.  Comprehensive assessment of the patient's malignancy, staging, need for surgery, chemotherapy, radiation therapy, and need for further testing were reviewed. Supportive measures, both inpatient and following discharge were also discussed. The recommended plan of care is documented. Greater than 35 minutes were spent correlating and coordinating this patient's care.

## 2020-02-07 ENCOUNTER — Inpatient Hospital Stay: Payer: Medicaid Other

## 2020-02-07 ENCOUNTER — Other Ambulatory Visit: Payer: Self-pay

## 2020-02-07 ENCOUNTER — Ambulatory Visit
Admission: RE | Admit: 2020-02-07 | Discharge: 2020-02-07 | Disposition: A | Discharge status: Home / Self Care | Payer: Medicaid Other | Source: Ambulatory Visit | Attending: Radiation Oncology | Admitting: Radiation Oncology

## 2020-02-07 ENCOUNTER — Encounter: Payer: Self-pay | Admitting: Radiation Oncology

## 2020-02-07 DIAGNOSIS — Z808 Family history of malignant neoplasm of other organs or systems: Secondary | ICD-10-CM | POA: Insufficient documentation

## 2020-02-07 DIAGNOSIS — C530 Malignant neoplasm of endocervix: Secondary | ICD-10-CM

## 2020-02-07 DIAGNOSIS — Z79899 Other long term (current) drug therapy: Secondary | ICD-10-CM | POA: Diagnosis not present

## 2020-02-07 DIAGNOSIS — R87810 Cervical high risk human papillomavirus (HPV) DNA test positive: Secondary | ICD-10-CM | POA: Diagnosis not present

## 2020-02-07 DIAGNOSIS — Z87891 Personal history of nicotine dependence: Secondary | ICD-10-CM | POA: Diagnosis not present

## 2020-02-07 DIAGNOSIS — K219 Gastro-esophageal reflux disease without esophagitis: Secondary | ICD-10-CM | POA: Diagnosis not present

## 2020-02-07 DIAGNOSIS — Z800 Family history of malignant neoplasm of digestive organs: Secondary | ICD-10-CM | POA: Diagnosis not present

## 2020-02-07 NOTE — Progress Notes (Addendum)
Radiation Oncology         [phone] ________________________________  Initial Outpatient Consultation  Name: Madeline Howard MRN: [id number]  Date: 02/07/2020  DOB: 1965/07/05  [id number], No Pcp Per  [name], MD   REFERRING PHYSICIAN: [name], MD  DIAGNOSIS: The encounter diagnosis was Malignant neoplasm of endocervix [facility]).  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 female who is seen as a courtesy of Dr. Berline Lopes for an opinion concerning radiation therapy as part of management for her recently diagnosed cervical cancer. Today, she is accompanied by no one. The patient presented to Dr. Addison Bailey on 12/28/2019 for evaluation of postmenopausal bleeding with cramping. An endometrial biopsy was performed at that time and revealed poorly differentiated carcinoma. A PAP smear was also performed and show highly dysplastic squamous cells c/w squamous cell carcinoma. 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   Given the above findings, the patient was referred to Dr. Berline Lopes and was seen in consultation on 01/12/2020. A cervical biopsy was taken at that time that showed invasive squamous cell carcinoma arising in the background of high-grade dysplasia. Endocervix curettage was also performed and showed high grade squamous intraepithelial lesion (CIN 3; severe dysplasia/carcinoma in situ).  The patient underwent a EUA, multiple cervical biopsies, ECC, and EMB on 01/17/2020. Final pathology revealed high grade squamous intraepithelial lesion, CIN-II/VAIN-11 (moderate dysplasia) of the vaginocervical junction at the 6 o'clock position and high grade intraepithelial lesion, CIN-III/VAIN III (severe dysplasia/CIS) at the 4 o'clock position. Invasive squamous cell carcinoma was also seen at the 12 o'clock position of the vaginocervical junction. Endometrium curettage showed predominantly blood with fragments of high  grade squamous intraepithelial lesion. Endocervix curettage showed invasive squamous cell carcinoma.  Exam by Dr. Berline Lopes was concerning for parametrial involvement.  CT scan of abdomen and pelvis on 01/26/2020 did not appreciate the known cervical malignancy. There was no evidence of lymphadenopathy or metastatic disease in the abdomen or pelvis. There was, however, a probable fibroid of the anterior uterine body that was not well delineated by CT.  The patient was seen by Dr. Alvy Bimler on 01/29/2020, during which time they discussed proceeding with concurrent chemoradiation therapy with weekly Cisplatin.  The patient's case was discussed at the Gynecologic Oncology Multi-Disciplinary Conference on 02/05/2020. It was recommended that she proceed with MRI followed by definitive treatment with radiation and chemotherapy or consideration for surgery.  If the patient's MRI does not show any obvious parametrial extension then she would be a candidate for radical hysterectomy  PREVIOUS RADIATION THERAPY: No  PAST MEDICAL HISTORY:  Past Medical History:  Diagnosis Date  . Allergy   . GERD (gastroesophageal reflux disease)     PAST SURGICAL HISTORY:History reviewed. No pertinent surgical history.  FAMILY HISTORY:  Family History  Problem Relation Age of Onset  . Cancer Mother        colon  . Diabetes Father   . Cancer Brother        melanoma  . Breast cancer Neg Hx   . Uterine cancer Neg Hx   . Ovarian cancer Neg Hx     SOCIAL HISTORY:  Social History   Tobacco Use  . Smoking status: Former Smoker    Packs/day: 1.50    Years: 15.00    Pack years: 22.50    Types: Cigarettes, E-cigarettes    Quit date: 07/15/2016    Years since quitting: 3.5  . Smokeless  tobacco: Never Used  Vaping Use  . Vaping Use: Every day  . Start date: 07/15/2016  . Substances: Nicotine, Flavoring  Substance Use Topics  . Alcohol use: Never  . Drug use: Never    ALLERGIES: No Known  Allergies  MEDICATIONS:  Current Outpatient Medications  Medication Sig Dispense Refill  . Chlorpheniramine Maleate (ALLERGY PO) Take 1 tablet by mouth daily.     . famotidine (PEPCID) 20 MG tablet Take 20 mg by mouth daily. OTC    . lidocaine-prilocaine (EMLA) cream Apply to affected area once 30 g 3  . Multiple Vitamin (MULTIVITAMIN) capsule Take 1 capsule by mouth daily.    . ondansetron (ZOFRAN) 8 MG tablet Take 1 tablet (8 mg total) by mouth every 8 (eight) hours as needed. Start on the third day after cisplatin chemotherapy. 30 tablet 1  . prochlorperazine (COMPAZINE) 10 MG tablet Take 1 tablet (10 mg total) by mouth every 6 (six) hours as needed (Nausea or vomiting). 30 tablet 1   No current facility-administered medications for this encounter.    REVIEW OF SYSTEMS:  A 10+ POINT REVIEW OF SYSTEMS WAS OBTAINED including neurology, dermatology, psychiatry, cardiac, respiratory, lymph, extremities, GI, GU, musculoskeletal, constitutional, reproductive, HEENT.  She denies any pelvic pain urination difficulties or bowel complaints.  She denies any vaginal bleeding.  In retrospect she did notice some mild postcoital bleeding over the past few months.   PHYSICAL EXAM:  height is 5\' 7"  (1.702 m) and weight is 158 lb 12.8 oz (72 kg). Her temperature is 97.7 F (36.5 C). Her blood pressure is 174/84 (abnormal) and her pulse is 73. Her respiration is 18 and oxygen saturation is 100%.   General: Alert and oriented, in no acute distress HEENT: Head is normocephalic. Extraocular movements are intact. Oropharynx is clear. Neck: Neck is supple, no palpable cervical or supraclavicular lymphadenopathy. Heart: Regular in rate and rhythm with no murmurs, rubs, or gallops. Chest: Clear to auscultation bilaterally, with no rhonchi, wheezes, or rales. Abdomen: Soft, nontender, nondistended, with no rigidity or guarding. Extremities: No cyanosis or edema. Lymphatics: see Neck Exam Skin: No concerning  lesions. Musculoskeletal: symmetric strength and muscle tone throughout. Neurologic: Cranial nerves II through XII are grossly intact. No obvious focalities. Speech is fluent. Coordination is intact. Psychiatric: Judgment and insight are intact. Affect is appropriate. Pelvic exam deferred until simulation and planning day.  As above the patient underwent an exam under anesthesia by Dr. Berline Lopes revealing the past possible examination revealing potential parametrial extension  ECOG = 1  0 - Asymptomatic (Fully active, able to carry on all predisease activities without restriction)  1 - Symptomatic but completely ambulatory (Restricted in physically strenuous activity but ambulatory and able to carry out work of a light or sedentary nature. For example, light housework, office work)  2 - Symptomatic, <50% in bed during the day (Ambulatory and capable of all self care but unable to carry out any work activities. Up and about more than 50% of waking hours)  3 - Symptomatic, >50% in bed, but not bedbound (Capable of only limited self-care, confined to bed or chair 50% or more of waking hours)  4 - Bedbound (Completely disabled. Cannot carry on any self-care. Totally confined to bed or chair)  5 - Death   Eustace Pen MM, Creech RH, Tormey DC, et al. [phone]). "Toxicity and response criteria of the [facility]". [place] Oncol. 5 (6): 649-55  LABORATORY DATA:  Lab Results  Component Value Date  WBC 6.8 01/15/2020   HGB 12.4 01/15/2020   HCT 37.7 01/15/2020   MCV 96.4 01/15/2020   PLT 299 01/15/2020   Lab Results  Component Value Date   NA 141 01/12/2020   K 4.3 01/12/2020   CL 106 01/12/2020   CO2 24 01/12/2020   GLUCOSE 92 01/12/2020   CREATININE 0.70 01/12/2020   CALCIUM 10.1 01/12/2020      RADIOGRAPHY: CT Abdomen Pelvis W Contrast  Result Date: 01/29/2020 CLINICAL DATA:  New diagnosis cervical cancer EXAM: CT ABDOMEN AND PELVIS WITH CONTRAST TECHNIQUE:  Multidetector CT imaging of the abdomen and pelvis was performed using the standard protocol following bolus administration of intravenous contrast. CONTRAST:  OMNIPAQUE IOHEXOL 300 MG/ML SOLN, additional oral enteric contrast COMPARISON:  None. FINDINGS: Lower chest: No acute abnormality. Hepatobiliary: No solid liver abnormality is seen. No gallstones, gallbladder wall thickening, or biliary dilatation. Pancreas: Unremarkable. No pancreatic ductal dilatation or surrounding inflammatory changes. Spleen: Normal in size without significant abnormality. Adrenals/Urinary Tract: Adrenal glands are unremarkable. Kidneys are normal, without renal calculi, solid lesion, or hydronephrosis. Bladder is unremarkable. Stomach/Bowel: Stomach is within normal limits. Appendix appears normal. No evidence of bowel wall thickening, distention, or inflammatory changes. Vascular/Lymphatic: Aortic atherosclerosis. No enlarged abdominal or pelvic lymph nodes. Reproductive: Probable fibroid of the anterior uterine body, not well delineated by CT (series 6, image 51). Known cervical malignancy is not well appreciated. Other: No abdominal wall hernia or abnormality. No abdominopelvic ascites. Musculoskeletal: No acute or significant osseous findings. IMPRESSION: 1. Known cervical malignancy is not well appreciated. There is no evidence of lymphadenopathy or metastatic disease in the abdomen or pelvis. 2. Probable fibroid of the anterior uterine body, not well delineated by CT. Aortic Atherosclerosis (ICD10-I70.0). Electronically Signed   By: Lauralyn Primes M.D.   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  If disease limited to the cervix then she would be a potential candidate for radical hysterectomy. If Pelvic MRI shows parametrial  extension then she would be better served with a definitive course of radiation therapy along with radiosensitizing chemotherapy.  Today, I talked to the patient about the findings and work-up thus far.  We discussed the natural history of cervical cancer and general treatment, highlighting the role of radiotherapy in the management.  We discussed the available radiation techniques, and focused on the details of logistics and delivery.  We reviewed the anticipated acute and late sequelae associated with radiation in this setting.  The patient was encouraged to ask questions that I answered to the best of my ability.     PLAN: Final treatment plans are pending MRI on January 3.  Patient will see Dr. Pricilla Holm later that day to discuss results of the MRI.  I have tentatively set the patient up for CT simulation on January 5 with treatments to begin the week of January 10.  She will also begin radiosensitizing chemotherapy with her first week of radiation therapy.  Radiation therapy plans will be canceled if she is a surgical candidate.  Total time spent in this encounter was 60 minutes which included reviewing the patient's most recent consultations, follow-ups, biopsies, pathology reports, CT scan of abdomen and pelvis, physical examination, and documentation.  ------------------------------------------------  Billie Lade, PhD, MD  This document serves as a  record of services personally performed by Gery Pray, MD. It was created on his behalf by Clerance Lav, a trained medical scribe. The creation of this record is based on the scribe's personal observations and the provider's statements to them. This document has been checked and approved by the attending provider.

## 2020-02-08 ENCOUNTER — Encounter: Payer: Self-pay | Admitting: Licensed Clinical Social Worker

## 2020-02-08 NOTE — Progress Notes (Signed)
[facility] Psychosocial Distress Screening Clinical Social Work  Clinical Social Work was referred by distress screening protocol.  The patient scored a 5 on the Psychosocial Distress Thermometer which indicates moderate distress.  Patient declined referral to social work team at this time. CSW may be consulted as needed in the future.   ONCBCN DISTRESS SCREENING 02/07/2020 01/12/2020  Screening Type Initial Screening Initial Screening  Distress experienced in past week (1-10) 5 2  Physician notified of physical symptoms  Yes  Referral to clinical psychology  No  Referral to clinical social work No No  Referral to dietition  No  Referral to financial advocate  No  Referral to support programs  No  Referral to palliative care  No  Other Christmas stress and dx     Clinical Social Worker follow up needed: No.  If yes, follow up plan:  Areeba Sulser E Perian Tedder, LCSW

## 2020-02-11 ENCOUNTER — Other Ambulatory Visit: Payer: Self-pay | Admitting: Radiology

## 2020-02-12 ENCOUNTER — Telehealth: Payer: Self-pay | Admitting: Oncology

## 2020-02-12 ENCOUNTER — Ambulatory Visit (HOSPITAL_COMMUNITY)
Admission: RE | Admit: 2020-02-12 | Discharge: 2020-02-12 | Disposition: A | Discharge status: Home / Self Care | Payer: Medicaid Other | Source: Ambulatory Visit | Attending: Gynecologic Oncology | Admitting: Gynecologic Oncology

## 2020-02-12 ENCOUNTER — Other Ambulatory Visit: Payer: Self-pay | Admitting: Gynecologic Oncology

## 2020-02-12 ENCOUNTER — Other Ambulatory Visit: Payer: Self-pay

## 2020-02-12 ENCOUNTER — Inpatient Hospital Stay: Payer: Medicaid Other | Admitting: Gynecologic Oncology

## 2020-02-12 DIAGNOSIS — Z87891 Personal history of nicotine dependence: Secondary | ICD-10-CM | POA: Insufficient documentation

## 2020-02-12 DIAGNOSIS — C539 Malignant neoplasm of cervix uteri, unspecified: Secondary | ICD-10-CM | POA: Insufficient documentation

## 2020-02-12 MED ORDER — GADOBUTROL 1 MMOL/ML IV SOLN
7.0000 mL | Freq: Once | INTRAVENOUS | Status: AC | PRN
  Administered 2020-02-12: 7 mL via INTRAVENOUS

## 2020-02-12 NOTE — Progress Notes (Signed)
Called the patient and reviewed MRI findings. Unfortunately, these are consistent with what I saw and felt on EUA. Given at least locally-advanced cervix cancer, we discussed moving forward with definitive chemoradiation. Will order PET scan to r/o nodal or distant metastatic disease. Patient voiced understanding.  Eugene Garnet MD Gynecologic Oncology

## 2020-02-12 NOTE — Telephone Encounter (Signed)
Madeline Howard with PET scan appointment on 02/21/20 with 10:30 am arrival (water only 6 hours prior).  She verbalized understanding and agreement.

## 2020-02-13 ENCOUNTER — Telehealth: Payer: Self-pay | Admitting: *Deleted

## 2020-02-13 ENCOUNTER — Other Ambulatory Visit: Payer: Self-pay | Admitting: Radiology

## 2020-02-13 ENCOUNTER — Ambulatory Visit (HOSPITAL_COMMUNITY)
Admission: RE | Admit: 2020-02-13 | Discharge: 2020-02-13 | Disposition: A | Discharge status: Home / Self Care | Payer: Medicaid Other | Source: Ambulatory Visit | Attending: Hematology and Oncology | Admitting: Hematology and Oncology

## 2020-02-13 ENCOUNTER — Telehealth: Payer: Self-pay | Admitting: Oncology

## 2020-02-13 ENCOUNTER — Encounter (HOSPITAL_COMMUNITY): Payer: Self-pay

## 2020-02-13 ENCOUNTER — Ambulatory Visit (HOSPITAL_COMMUNITY)
Admission: RE | Admit: 2020-02-13 | Discharge: 2020-02-13 | Disposition: A | Discharge status: Home / Self Care | Payer: Medicaid Other | Source: Ambulatory Visit | Attending: Gynecologic Oncology | Admitting: Gynecologic Oncology

## 2020-02-13 DIAGNOSIS — C539 Malignant neoplasm of cervix uteri, unspecified: Secondary | ICD-10-CM | POA: Diagnosis not present

## 2020-02-13 HISTORY — PX: IR IMAGING GUIDED PORT INSERTION: IMG5740

## 2020-02-13 LAB — CBC WITH DIFFERENTIAL/PLATELET
Abs Immature Granulocytes: 0.02 10*3/uL (ref 0.00–0.07)
Basophils Absolute: 0.1 10*3/uL (ref 0.0–0.1)
Basophils Relative: 1 %
Eosinophils Absolute: 0.3 10*3/uL (ref 0.0–0.5)
Eosinophils Relative: 4 %
HCT: 41.4 % (ref 36.0–46.0)
Hemoglobin: 13.2 g/dL (ref 12.0–15.0)
Immature Granulocytes: 0 %
Lymphocytes Relative: 21 %
Lymphs Abs: 1.5 10*3/uL (ref 0.7–4.0)
MCH: 30.6 pg (ref 26.0–34.0)
MCHC: 31.9 g/dL (ref 30.0–36.0)
MCV: 96.1 fL (ref 80.0–100.0)
Monocytes Absolute: 0.7 10*3/uL (ref 0.1–1.0)
Monocytes Relative: 10 %
Neutro Abs: 4.5 10*3/uL (ref 1.7–7.7)
Neutrophils Relative %: 64 %
Platelets: 291 10*3/uL (ref 150–400)
RBC: 4.31 MIL/uL (ref 3.87–5.11)
RDW: 12.2 % (ref 11.5–15.5)
WBC: 7.1 10*3/uL (ref 4.0–10.5)
nRBC: 0 % (ref 0.0–0.2)

## 2020-02-13 LAB — PROTIME-INR
INR: 1 (ref 0.8–1.2)
Prothrombin Time: 13 seconds (ref 11.4–15.2)

## 2020-02-13 LAB — COMPREHENSIVE METABOLIC PANEL
ALT: 24 U/L (ref 0–44)
AST: 41 U/L (ref 15–41)
Albumin: 4.1 g/dL (ref 3.5–5.0)
Alkaline Phosphatase: 60 U/L (ref 38–126)
Anion gap: 10 (ref 5–15)
BUN: 6 mg/dL (ref 6–20)
CO2: 23 mmol/L (ref 22–32)
Calcium: 9.4 mg/dL (ref 8.9–10.3)
Chloride: 106 mmol/L (ref 98–111)
Creatinine, Ser: 0.66 mg/dL (ref 0.44–1.00)
GFR, Estimated: >60 mL/min (ref >60)
Glucose, Bld: 98 mg/dL (ref 70–99)
Potassium: 4.6 mmol/L (ref 3.5–5.1)
Sodium: 139 mmol/L (ref 135–145)
Total Bilirubin: 0.3 mg/dL (ref 0.3–1.2)
Total Protein: 8.9 g/dL — ABNORMAL HIGH (ref 6.5–8.1)

## 2020-02-13 MED ORDER — LIDOCAINE-EPINEPHRINE 1 %-1:100000 IJ SOLN
INTRAMUSCULAR | Status: AC | PRN
  Administered 2020-02-13: 10 mL

## 2020-02-13 MED ORDER — SODIUM CHLORIDE 0.9 % IV SOLN: INTRAVENOUS | Status: DC

## 2020-02-13 MED ORDER — MIDAZOLAM HCL 2 MG/2ML IJ SOLN
INTRAMUSCULAR | Status: AC | PRN
  Administered 2020-02-13: 1 mg via INTRAVENOUS
  Administered 2020-02-13: 2 mg via INTRAVENOUS
  Administered 2020-02-13: 1 mg via INTRAVENOUS

## 2020-02-13 MED ORDER — LIDOCAINE-EPINEPHRINE 1 %-1:100000 IJ SOLN
INTRAMUSCULAR | Status: AC
  Filled 2020-02-13: qty 1

## 2020-02-13 MED ORDER — HEPARIN SOD (PORK) LOCK FLUSH 100 UNIT/ML IV SOLN
INTRAVENOUS | Status: AC | PRN
  Administered 2020-02-13: 500 [IU] via INTRAVENOUS

## 2020-02-13 MED ORDER — HEPARIN SOD (PORK) LOCK FLUSH 100 UNIT/ML IV SOLN
INTRAVENOUS | Status: AC
  Filled 2020-02-13: qty 5

## 2020-02-13 MED ORDER — CEFAZOLIN SODIUM-DEXTROSE 2-4 GM/100ML-% IV SOLN
INTRAVENOUS | Status: AC
  Filled 2020-02-13: qty 100

## 2020-02-13 MED ORDER — MIDAZOLAM HCL 2 MG/2ML IJ SOLN
INTRAMUSCULAR | Status: AC
  Filled 2020-02-13: qty 4

## 2020-02-13 MED ORDER — FENTANYL CITRATE (PF) 100 MCG/2ML IJ SOLN
INTRAMUSCULAR | Status: AC | PRN
Start: 2020-02-13 — End: 2020-02-13
  Administered 2020-02-13 (×2): 50 ug via INTRAVENOUS

## 2020-02-13 MED ORDER — FENTANYL CITRATE (PF) 100 MCG/2ML IJ SOLN
INTRAMUSCULAR | Status: AC
  Filled 2020-02-13: qty 2

## 2020-02-13 MED ORDER — CEFAZOLIN SODIUM-DEXTROSE 2-4 GM/100ML-% IV SOLN: 2.0000 g | INTRAVENOUS | Status: DC

## 2020-02-13 NOTE — Telephone Encounter (Signed)
Called Madeline Howard and advised her that Dr. Roselind Messier and Dr. Bertis Ruddy would like to review her PET scan results before she starts treatment.  Discussed that chemotherapy will now start on 02/23/20 and radiation on 02/26/20.  Advised her that the appointments for tomorrow have been canceled and rescheduled to 02/22/20.  She said she can see the new appointments on MyChart and verbalized understanding of schedule changes.

## 2020-02-13 NOTE — Procedures (Signed)
Interventional Radiology Procedure Note ° °Procedure: Single Lumen Power Port Placement   ° °Access:  Right internal jugular vein ° °Findings: Catheter tip positioned at cavoatrial junction. Port is ready for immediate use.  ° °Complications: None ° °EBL: < 10 mL ° °Recommendations:  °- Ok to shower in 24 hours °- Do not submerge for 7 days °- Routine line care  ° ° °Jo Booze, MD ° ° ° °

## 2020-02-13 NOTE — Discharge Instructions (Signed)

## 2020-02-13 NOTE — Consult Note (Signed)
Chief Complaint: Patient was seen in consultation today for Port-A-Cath placement  Referring Physician(s): Gorsuch,Ni  Supervising Physician: Marliss Coots  Patient Status: [facility] - Out-pt  History of Present Illness: Madeline Howard is a 55 y.o. female , ex smoker, with history of newly diagnosed cervical cancer who presents today for Port-A-Cath placement for chemotherapy.   Past Medical History:  Diagnosis Date  . Allergy   . GERD (gastroesophageal reflux disease)     History reviewed. No pertinent surgical history.  Allergies: Patient has no known allergies.  Medications: Prior to Admission medications   Medication Sig Start Date End Date Taking? Authorizing Provider  acetaminophen (TYLENOL) 650 MG CR tablet Take 1,300 mg by mouth every 8 (eight) hours as needed for pain.   Yes [provider]  Chlorpheniramine Maleate (ALLERGY PO) Take 1 tablet by mouth daily.    Yes [provider]  famotidine (PEPCID) 20 MG tablet Take 20 mg by mouth daily. OTC   Yes [provider]  Multiple Vitamin (MULTIVITAMIN) capsule Take 1 capsule by mouth daily.   Yes [provider]  lidocaine-prilocaine (EMLA) cream Apply to affected area once 01/29/20   Artis Delay, MD  ondansetron (ZOFRAN) 8 MG tablet Take 1 tablet (8 mg total) by mouth every 8 (eight) hours as needed. Start on the third day after cisplatin chemotherapy. 01/29/20   Artis Delay, MD  prochlorperazine (COMPAZINE) 10 MG tablet Take 1 tablet (10 mg total) by mouth every 6 (six) hours as needed (Nausea or vomiting). 01/29/20   Artis Delay, MD     Family History  Problem Relation Age of Onset  . Cancer Mother        colon  . Diabetes Father   . Cancer Brother        melanoma  . Breast cancer Neg Hx   . Uterine cancer Neg Hx   . Ovarian cancer Neg Hx     Social History   Socioeconomic History  . Marital status: Significant Other    Spouse name: Jeneen Rinks  . Number of children: 2   . Years of education: Not on file  . Highest education level: Not on file  Occupational History  . Occupation: unemployed    Comment: Forensic psychologist  Tobacco Use  . Smoking status: Former Smoker    Packs/day: 1.50    Years: 15.00    Pack years: 22.50    Types: Cigarettes, E-cigarettes    Quit date: 07/15/2016    Years since quitting: 3.5  . Smokeless tobacco: Never Used  Vaping Use  . Vaping Use: Every day  . Start date: 07/15/2016  . Substances: Nicotine, Flavoring  Substance and Sexual Activity  . Alcohol use: Never  . Drug use: Never  . Sexual activity: Yes    Birth control/protection: None  Other Topics Concern  . Not on file  Social History Narrative  . Not on file   Social Determinants of Health   Financial Resource Strain: Not on file  Food Insecurity: Not on file  Transportation Needs: Not on file  Physical Activity: Not on file  Stress: Not on file  Social Connections: Not on file      Review of Systems denies fever, headache, chest pain, dyspnea, cough, back pain, nausea, vomiting or bleeding; she does have lower abdominal/pelvic discomfort.  Vital Signs: BP (!) 191/88 (BP Location: Right Arm)   Pulse 83   Temp 98.6 F (37 C) (Oral)   Resp 17  Ht 5\' 6"  (1.676 m)   Wt 158 lb (71.7 kg)   SpO2 99%   BMI 25.50 kg/m   Physical Exam awake, alert.  Chest clear to auscultation bilaterally.  Heart with regular rate and rhythm.  Abdomen soft, positive bowel sounds, some tenderness lower abdominal/pelvic region to palpation; no lower extremity edema  Imaging: MR PELVIS W WO CONTRAST  Result Date: 02/12/2020 CLINICAL DATA:  Cervical carcinoma.  Staging. EXAM: MRI PELVIS WITHOUT AND WITH CONTRAST TECHNIQUE: Multiplanar multisequence MR imaging of the pelvis was performed both before and after administration of intravenous contrast. CONTRAST:  68mL GADAVIST GADOBUTROL 1 MMOL/ML IV SOLN COMPARISON:  None. FINDINGS: Lower Urinary Tract: Lack of fat plane noted  between parametrial tumor along the right anterior lower uterine segment and posterior bladder wall (image 15/16), suspicious for bladder involvement. Bowel: Lack of fat plane noted between parametrial tumor along the right anterior lower uterine segment and adjacent small bowel loop (image 15/16), suspicious for small bowel involvement. Vascular/Lymphatic: 8 mm right common iliac lymph node is noted on image 21/14. No pathologically enlarged pelvic lymph nodes identified. Reproductive: -- Uterus: Measures 9.0 x 4.5 by 5.4 cm (volume = 110 cm^3). A poorly-defined, infiltrative mass is seen involving cervix, lower uterine segment, and uterine corpus, which measures 6.5 x 4.4 by 4.5 cm. This mass shows deep myometrial involvement to the serosa in the lower uterine segment, and mild hydrometros, however there is no evidence of vaginal wall invasion. Loss of the serosal margin is seen along the right lateral wall of the cervix (image 18/5 and 18/6), and in the right anterior uterine segment (image 15/16), suspicious for parametrial soft tissue invasion. -- Right ovary: Appears normal. No ovarian or adnexal masses identified. -- Left ovary: Appears normal. No ovarian or adnexal masses identified. Other: No peritoneal thickening or abnormal free fluid. Musculoskeletal:  Unremarkable. IMPRESSION: 6.5 cm infiltrative mass throughout the cervix, lower uterine segment, and uterine corpus, with mild hydrometros, consistent with known cervical carcinoma. Suspected parametrial invasion along the right lateral surgical margin and the right anterior lower uterine segment. Possible involvement of posterior bladder wall and an adjacent small bowel loop. (FIGO stage IVA) 8 mm right common iliac lymph node. Lymph node metastasis cannot be excluded. PET-CT scan could be performed for further evaluation. Electronically Signed   By: Marlaine Hind M.D.   On: 02/12/2020 09:28   CT Abdomen Pelvis W Contrast  Result Date:  01/29/2020 CLINICAL DATA:  New diagnosis cervical cancer EXAM: CT ABDOMEN AND PELVIS WITH CONTRAST TECHNIQUE: Multidetector CT imaging of the abdomen and pelvis was performed using the standard protocol following bolus administration of intravenous contrast. CONTRAST:  116mL OMNIPAQUE IOHEXOL 300 MG/ML SOLN, additional oral enteric contrast COMPARISON:  None. FINDINGS: Lower chest: No acute abnormality. Hepatobiliary: No solid liver abnormality is seen. No gallstones, gallbladder wall thickening, or biliary dilatation. Pancreas: Unremarkable. No pancreatic ductal dilatation or surrounding inflammatory changes. Spleen: Normal in size without significant abnormality. Adrenals/Urinary Tract: Adrenal glands are unremarkable. Kidneys are normal, without renal calculi, solid lesion, or hydronephrosis. Bladder is unremarkable. Stomach/Bowel: Stomach is within normal limits. Appendix appears normal. No evidence of bowel wall thickening, distention, or inflammatory changes. Vascular/Lymphatic: Aortic atherosclerosis. No enlarged abdominal or pelvic lymph nodes. Reproductive: Probable fibroid of the anterior uterine body, not well delineated by CT (series 6, image 51). Known cervical malignancy is not well appreciated. Other: No abdominal wall hernia or abnormality. No abdominopelvic ascites. Musculoskeletal: No acute or significant osseous findings. IMPRESSION: 1. Known cervical  malignancy is not well appreciated. There is no evidence of lymphadenopathy or metastatic disease in the abdomen or pelvis. 2. Probable fibroid of the anterior uterine body, not well delineated by CT. Aortic Atherosclerosis (ICD10-I70.0). Electronically Signed   By: Eddie Candle M.D.   On: 01/29/2020 08:43    Labs:  CBC: Recent Labs    01/12/20 1456 01/15/20 0933 02/13/20 1222  WBC 10.5 6.8 7.1  HGB 12.5 12.4 13.2  HCT 38.4 37.7 41.4  PLT 294 299 291    COAGS: Recent Labs    02/13/20 1222  INR 1.0    BMP: Recent Labs     01/12/20 1456  NA 141  K 4.3  CL 106  CO2 24  GLUCOSE 92  BUN 7  CALCIUM 10.1  CREATININE 0.70  GFRNONAA >60    LIVER FUNCTION TESTS: Recent Labs    01/12/20 1456  BILITOT 0.4  AST 47*  ALT 36  ALKPHOS 66  PROT 9.1*  ALBUMIN 3.9    TUMOR MARKERS: No results for input(s): AFPTM, CEA, CA199, CHROMGRNA in the last 8760 hours.  Assessment and Plan: 55 y.o. female , ex smoker, with history of newly diagnosed cervical cancer who presents today for Port-A-Cath placement for chemotherapy.Risks and benefits of image guided port-a-catheter placement was discussed with the patient including, but not limited to bleeding, infection, pneumothorax, or fibrin sheath development and need for additional procedures.  All of the patient's questions were answered, patient is agreeable to proceed. Consent signed and in chart.     Thank you for this interesting consult.  I greatly enjoyed meeting NATYLEE WALLOCK and look forward to participating in their care.  A copy of this report was sent to the requesting provider on this date.  Electronically Signed: D. Rowe Robert, PA-C 02/13/2020, 12:46 PM   I spent a total of  25 minutes   in face to face in clinical consultation, greater than 50% of which was counseling/coordinating care for Port-A-Cath placement

## 2020-02-13 NOTE — Telephone Encounter (Signed)
Called patient to ask about coming for sim on 02-15-20 due to Dr. Roselind Messier being in the OR, patient agreed to @ 3 pm on 02-15-20

## 2020-02-14 ENCOUNTER — Inpatient Hospital Stay: Payer: Medicaid Other | Admitting: Hematology and Oncology

## 2020-02-14 ENCOUNTER — Ambulatory Visit: Payer: Medicaid Other | Admitting: Radiation Oncology

## 2020-02-14 ENCOUNTER — Inpatient Hospital Stay: Payer: Medicaid Other

## 2020-02-15 ENCOUNTER — Ambulatory Visit: Payer: Medicaid Other | Admitting: Radiation Oncology

## 2020-02-16 ENCOUNTER — Inpatient Hospital Stay: Payer: Medicaid Other

## 2020-02-16 NOTE — Progress Notes (Signed)
Pharmacist Chemotherapy Monitoring - Initial Assessment    Anticipated start date: 02/23/20   Regimen:  . Are orders appropriate based on the patient's diagnosis, regimen, and cycle? Yes . Does the plan date match the patient's scheduled date? Yes . Is the sequencing of drugs appropriate? Yes . Are the premedications appropriate for the patient's regimen? Yes . Prior Authorization for treatment is: Pending o If applicable, is the correct biosimilar selected based on the patient's insurance? not applicable  Organ Function and Labs: Marland Kitchen Are dose adjustments needed based on the patient's renal function, hepatic function, or hematologic function? Yes . Are appropriate labs ordered prior to the start of patient's treatment? Yes . Other organ system assessment, if indicated: cisplatin: baseline audiogram . The following baseline labs, if indicated, have been ordered: N/A  Dose Assessment: . Are the drug doses appropriate? Yes . Are the following correct: o Drug concentrations Yes o IV fluid compatible with drug Yes o Administration routes Yes o Timing of therapy Yes . If applicable, does the patient have documented access for treatment and/or plans for port-a-cath placement? not applicable . If applicable, have lifetime cumulative doses been properly documented and assessed? not applicable Lifetime Dose Tracking  No doses have been documented on this patient for the following tracked chemicals: Doxorubicin, Epirubicin, Idarubicin, Daunorubicin, Mitoxantrone, Bleomycin, Oxaliplatin, Carboplatin, Liposomal Doxorubicin  o   Toxicity Monitoring/Prevention: . The patient has the following take home antiemetics prescribed: Ondansetron and Prochlorperazine . The patient has the following take home medications prescribed: N/A . Medication allergies and previous infusion related reactions, if applicable, have been reviewed and addressed. Yes . The patient's current medication list has been assessed  for drug-drug interactions with their chemotherapy regimen. no significant drug-drug interactions were identified on review.  Order Review: . Are the treatment plan orders signed? Yes . Is the patient scheduled to see a provider prior to their treatment? Yes  I verify that I have reviewed each item in the above checklist and answered each question accordingly.  Debhora Titus K 02/16/2020 9:56 AM

## 2020-02-21 ENCOUNTER — Other Ambulatory Visit: Payer: Self-pay

## 2020-02-21 ENCOUNTER — Encounter (HOSPITAL_COMMUNITY)
Admission: RE | Admit: 2020-02-21 | Discharge: 2020-02-21 | Disposition: A | Discharge status: Home / Self Care | Payer: Medicaid Other | Source: Ambulatory Visit | Attending: Gynecologic Oncology | Admitting: Gynecologic Oncology

## 2020-02-21 DIAGNOSIS — C539 Malignant neoplasm of cervix uteri, unspecified: Secondary | ICD-10-CM | POA: Insufficient documentation

## 2020-02-21 LAB — GLUCOSE, CAPILLARY: Glucose-Capillary: 89 mg/dL (ref 70–99)

## 2020-02-21 MED ORDER — FLUDEOXYGLUCOSE F - 18 (FDG) INJECTION
7.8400 | Freq: Once | INTRAVENOUS | Status: AC
  Administered 2020-02-21: 7.84 via INTRAVENOUS

## 2020-02-22 ENCOUNTER — Other Ambulatory Visit: Payer: Self-pay

## 2020-02-22 ENCOUNTER — Telehealth: Payer: Self-pay

## 2020-02-22 ENCOUNTER — Other Ambulatory Visit: Payer: Self-pay | Admitting: Hematology and Oncology

## 2020-02-22 ENCOUNTER — Inpatient Hospital Stay: Payer: Medicaid Other

## 2020-02-22 ENCOUNTER — Ambulatory Visit
Admission: RE | Admit: 2020-02-22 | Discharge: 2020-02-22 | Disposition: A | Discharge status: Home / Self Care | Payer: Medicaid Other | Source: Ambulatory Visit | Attending: Radiation Oncology | Admitting: Radiation Oncology

## 2020-02-22 ENCOUNTER — Inpatient Hospital Stay: Payer: Medicaid Other | Attending: Gynecologic Oncology | Admitting: Hematology and Oncology

## 2020-02-22 ENCOUNTER — Encounter: Payer: Self-pay | Admitting: Hematology and Oncology

## 2020-02-22 DIAGNOSIS — C539 Malignant neoplasm of cervix uteri, unspecified: Secondary | ICD-10-CM

## 2020-02-22 DIAGNOSIS — T451X5A Adverse effect of antineoplastic and immunosuppressive drugs, initial encounter: Secondary | ICD-10-CM | POA: Insufficient documentation

## 2020-02-22 DIAGNOSIS — Z510 Encounter for antineoplastic radiation therapy: Secondary | ICD-10-CM | POA: Insufficient documentation

## 2020-02-22 DIAGNOSIS — Z5111 Encounter for antineoplastic chemotherapy: Secondary | ICD-10-CM | POA: Diagnosis present

## 2020-02-22 DIAGNOSIS — D6481 Anemia due to antineoplastic chemotherapy: Secondary | ICD-10-CM | POA: Diagnosis not present

## 2020-02-22 DIAGNOSIS — I700 Atherosclerosis of aorta: Secondary | ICD-10-CM | POA: Diagnosis not present

## 2020-02-22 DIAGNOSIS — Z79899 Other long term (current) drug therapy: Secondary | ICD-10-CM | POA: Insufficient documentation

## 2020-02-22 DIAGNOSIS — I10 Essential (primary) hypertension: Secondary | ICD-10-CM | POA: Diagnosis not present

## 2020-02-22 DIAGNOSIS — Z7289 Other problems related to lifestyle: Secondary | ICD-10-CM

## 2020-02-22 DIAGNOSIS — C530 Malignant neoplasm of endocervix: Secondary | ICD-10-CM | POA: Diagnosis not present

## 2020-02-22 DIAGNOSIS — G893 Neoplasm related pain (acute) (chronic): Secondary | ICD-10-CM | POA: Insufficient documentation

## 2020-02-22 LAB — CBC WITH DIFFERENTIAL (CANCER CENTER ONLY)
Abs Immature Granulocytes: 0.02 10*3/uL (ref 0.00–0.07)
Basophils Absolute: 0.1 10*3/uL (ref 0.0–0.1)
Basophils Relative: 1 %
Eosinophils Absolute: 0.3 10*3/uL (ref 0.0–0.5)
Eosinophils Relative: 3 %
HCT: 39.2 % (ref 36.0–46.0)
Hemoglobin: 12.9 g/dL (ref 12.0–15.0)
Immature Granulocytes: 0 %
Lymphocytes Relative: 23 %
Lymphs Abs: 1.9 10*3/uL (ref 0.7–4.0)
MCH: 30 pg (ref 26.0–34.0)
MCHC: 32.9 g/dL (ref 30.0–36.0)
MCV: 91.2 fL (ref 80.0–100.0)
Monocytes Absolute: 1 10*3/uL (ref 0.1–1.0)
Monocytes Relative: 12 %
Neutro Abs: 5.1 10*3/uL (ref 1.7–7.7)
Neutrophils Relative %: 61 %
Platelet Count: 308 10*3/uL (ref 150–400)
RBC: 4.3 MIL/uL (ref 3.87–5.11)
RDW: 12.1 % (ref 11.5–15.5)
WBC Count: 8.4 10*3/uL (ref 4.0–10.5)
nRBC: 0 % (ref 0.0–0.2)

## 2020-02-22 LAB — BASIC METABOLIC PANEL - CANCER CENTER ONLY
Anion gap: 9 (ref 5–15)
BUN: 8 mg/dL (ref 6–20)
CO2: 25 mmol/L (ref 22–32)
Calcium: 9.8 mg/dL (ref 8.9–10.3)
Chloride: 104 mmol/L (ref 98–111)
Creatinine: 0.67 mg/dL (ref 0.44–1.00)
GFR, Estimated: >60 mL/min (ref >60)
Glucose, Bld: 94 mg/dL (ref 70–99)
Potassium: 4 mmol/L (ref 3.5–5.1)
Sodium: 138 mmol/L (ref 135–145)

## 2020-02-22 LAB — MAGNESIUM: Magnesium: 1.6 mg/dL — ABNORMAL LOW (ref 1.7–2.4)

## 2020-02-22 MED ORDER — SODIUM CHLORIDE 0.9% FLUSH
10.0000 mL | Freq: Once | INTRAVENOUS | Status: AC
Start: 2020-02-22 — End: 2020-02-22
  Administered 2020-02-22: 10 mL
  Filled 2020-02-22: qty 10

## 2020-02-22 MED ORDER — AMLODIPINE BESYLATE 5 MG PO TABS: 5.0000 mg | ORAL_TABLET | Freq: Every day | ORAL | 11 refills | Status: DC

## 2020-02-22 MED ORDER — MAGNESIUM OXIDE 400 (241.3 MG) MG PO TABS: 400.0000 mg | ORAL_TABLET | Freq: Every day | ORAL | 1 refills | Status: DC

## 2020-02-22 NOTE — Assessment & Plan Note (Signed)
I have reviewed PET CT imaging findings with the patient The plan would be to proceed with concurrent chemoradiation therapy as scheduled I will order more blood work and follow-up in the future after radiation simulation today I will see her weekly for toxicity review She is in agreement to proceed

## 2020-02-22 NOTE — Assessment & Plan Note (Signed)
Imaging studies show findings consistent with an treated hypertension I will start her on low-dose amlodipine I recommend the patient to start checking her blood pressure daily and I will reassess next week and adjust her medication as needed

## 2020-02-22 NOTE — Telephone Encounter (Signed)
Called and given below message. She verbalized understanding. 

## 2020-02-22 NOTE — Telephone Encounter (Signed)
-----   Message from Heath Lark, MD sent at 02/22/2020  3:27 PM EST ----- Regarding: low mag I sent prescription for magnesium  Please let her know her mag is low, take 1 daily

## 2020-02-22 NOTE — Progress Notes (Signed)
[place] OFFICE PROGRESS NOTE  Patient Care Team: Patient, No Pcp Per as PCP - General (General Practice) Awanda Mink, Craige Cotta, RN as Oncology Nurse Navigator (Oncology)  ASSESSMENT & PLAN:  Malignant neoplasm of cervix [facility]) I have reviewed PET CT imaging findings with the patient The plan would be to proceed with concurrent chemoradiation therapy as scheduled I will order more blood work and follow-up in the future after radiation simulation today I will see her weekly for toxicity review She is in agreement to proceed  Current every day vaping I encouraged her to discontinue vaping due to risk of infection while on treatment  Essential hypertension Imaging studies show findings consistent with an treated hypertension I will start her on low-dose amlodipine I recommend the patient to start checking her blood pressure daily and I will reassess next week and adjust her medication as needed  Hypomagnesemia I will start her on oral magnesium supplement   No orders of the defined types were placed in this encounter.   All questions were answered. The patient knows to call the clinic with any problems, questions or concerns. The total time spent in the appointment was 20 minutes encounter with patients including review of chart and various tests results, discussions about plan of care and coordination of care plan   Heath Lark, MD 02/22/2020 3:26 PM  INTERVAL HISTORY: Please see below for problem oriented charting. She returns for further follow-up She have slightly less bleeding She continues to vape daily She have occasional abdominal discomfort Denies recent chest pain or shortness of breath  SUMMARY OF ONCOLOGIC HISTORY: Oncology History  Malignant neoplasm of cervix ([place])  12/11/2019 Initial Diagnosis   She presented with postmenopausal bleeding   01/12/2020 Initial Diagnosis   Malignant neoplasm of cervix ([place])   01/12/2020 Pathology Results   FINAL  MICROSCOPIC DIAGNOSIS:   A. CERVIX, 3:00, 5:00, BIOPSY:  -  Invasive squamous cell carcinoma arising in a background of high-grade dysplasia  -  See comment   B.  ENDOCERVIX, CURETTAGE:  -  High grade squamous intraepithelial lesion (CIN 3; severe dysplasia/carcinoma in situ)    01/17/2020 Pathology Results   FINAL MICROSCOPIC DIAGNOSIS:   A. VAGINOCERVICAL JUNCTION, 6:00, BIOPSY:  - High grade squamous intraepithelial lesion, CIN-II/VAIN-II (moderate dysplasia).   B. VAGINOCERVICAL JUNCTION, 4:00, BIOPSY:  - High grade squamous intraepithelial lesion, CIN-III/VAIN-III (severe dysplasia/CIS).   C. VAGINOCERVICAL JUNCTION, 12:00, BIOPSY:  - Invasive squamous cell carcinoma, see comment.   D. ENDOMETRIUM, CURETTAGE:  - Predominately blood with fragments of high grade squamous intraepithelial lesion.   E. ENDOCERVIX, CURETTAGE:  - Invasive squamous cell carcinoma, see comment   01/17/2020 Surgery   Postoperative Diagnosis: Clinical stage IIB disease due to parametria thickening   Surgery: EUA, multiple cervical biopsies, ECC, EMB    Surgeons:  Jeral Pinch MD  Pathology: Biopsies at 4 and 6  o'c at the posterior cervicovaginal junction, biopsy from anterior cervix at 37 o'c, ECC, EMB   Operative findings: Cervix barrel shaped and firm, visible lesion that is friable spanning most of the posterior cervix (approximately 2 x 3.5cm). Cervix is flush with the vagina posteriorly; lesion abuts the cervico-vaginal junction. Very gritty endocervical canal noted on ECC. EMB revealed somewhat purulent appearing blood. On rectovaginal exam, concerning for thickening posteriorly in the midline and to the left, concerning for parametria involvement.   01/26/2020 Cancer Staging   Staging form: Cervix Uteri, AJCC Version 9 - Clinical stage from 01/26/2020: FIGO Stage IIB (cT2b,  cN0, cM0) - Signed by Heath Lark, MD on 01/29/2020   01/26/2020 Imaging   1. Known cervical malignancy is not  well appreciated. There is no evidence of lymphadenopathy or metastatic disease in the abdomen or pelvis. 2. Probable fibroid of the anterior uterine body, not well delineated by CT.   Aortic Atherosclerosis (ICD10-I70.0).     02/13/2020 Procedure   Successful placement of a power injectable Port-A-Cath via the right internal jugular vein. The catheter is ready for immediate use.   02/16/2020 -  Chemotherapy    Patient is on Treatment Plan: CERVICAL CISPLATIN Q7D      02/21/2020 PET scan   1. Cervical and lower uterine segment mass with maximum SUV 12.2, compatible with malignancy. 2. Although the 7 mm right common iliac node questioned on recent pelvic MRI has only low-grade activity similar to blood pool on today's examination, there is a small focus of cutaneous nodularity along the lateral margin of the left labium majus which could conceivably represent a small cutaneous metastatic lymph node, although urinary contamination can sometimes cause a similar appearance. Physical exam inspection of this region along the skin fold between the left labium majus and left upper thigh region with attention to any underlying nodularity is suggested. 3. 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 If this is not a known condition, cardiology referral for further workup may be warranted. 4.  Aortic Atherosclerosis (ICD10-I70.0).     REVIEW OF SYSTEMS:   Constitutional: Denies fevers, chills or abnormal weight loss Eyes: Denies blurriness of vision Ears, nose, mouth, throat, and face: Denies mucositis or sore throat Respiratory: Denies cough, dyspnea or wheezes Cardiovascular: Denies palpitation, chest discomfort or lower extremity swelling Gastrointestinal:  Denies nausea, heartburn or change in bowel habits Skin: Denies abnormal skin rashes Lymphatics: Denies new lymphadenopathy or easy bruising Neurological:Denies numbness, tingling or new weaknesses Behavioral/Psych:  Mood is stable, no new changes  All other systems were reviewed with the patient and are negative.  I have reviewed the past medical history, past surgical history, social history and family history with the patient and they are unchanged from previous note.  ALLERGIES:  has No Known Allergies.  MEDICATIONS:  Current Outpatient Medications  Medication Sig Dispense Refill  . amLODipine (NORVASC) 5 MG tablet Take 1 tablet (5 mg total) by mouth daily. 30 tablet 11  . magnesium oxide (MAG-OX) 400 (241.3 Mg) MG tablet Take 1 tablet (400 mg total) by mouth daily. 30 tablet 1  . acetaminophen (TYLENOL) 650 MG CR tablet Take 1,300 mg by mouth every 8 (eight) hours as needed for pain.    . Chlorpheniramine Maleate (ALLERGY PO) Take 1 tablet by mouth daily.     . famotidine (PEPCID) 20 MG tablet Take 20 mg by mouth daily. OTC    . lidocaine-prilocaine (EMLA) cream Apply to affected area once 30 g 3  . Multiple Vitamin (MULTIVITAMIN) capsule Take 1 capsule by mouth daily.    . ondansetron (ZOFRAN) 8 MG tablet Take 1 tablet (8 mg total) by mouth every 8 (eight) hours as needed. Start on the third day after cisplatin chemotherapy. 30 tablet 1  . prochlorperazine (COMPAZINE) 10 MG tablet Take 1 tablet (10 mg total) by mouth every 6 (six) hours as needed (Nausea or vomiting). 30 tablet 1   No current facility-administered medications for this visit.    PHYSICAL EXAMINATION: ECOG PERFORMANCE STATUS: 1 - Symptomatic but completely ambulatory  Vitals:   02/22/20 1342  BP: Marland Kitchen)  173/84  Pulse: 71  Resp: 18  Temp: 98.1 F (36.7 C)  SpO2: 98%   Filed Weights   02/22/20 1342  Weight: 157 lb 9.6 oz (71.5 kg)    GENERAL:alert, no distress and comfortable NEURO: alert & oriented x 3 with fluent speech, no focal motor/sensory deficits  LABORATORY DATA:  I have reviewed the data as listed    Component Value Date/Time   NA 138 02/22/2020 1244   K 4.0 02/22/2020 1244   CL 104 02/22/2020 1244   CO2  25 02/22/2020 1244   GLUCOSE 94 02/22/2020 1244   BUN 8 02/22/2020 1244   CREATININE 0.67 02/22/2020 1244   CALCIUM 9.8 02/22/2020 1244   PROT 8.9 (H) 02/13/2020 1222   ALBUMIN 4.1 02/13/2020 1222   AST 41 02/13/2020 1222   AST 47 (H) 01/12/2020 1456   ALT 24 02/13/2020 1222   ALT 36 01/12/2020 1456   ALKPHOS 60 02/13/2020 1222   BILITOT 0.3 02/13/2020 1222   BILITOT 0.4 01/12/2020 1456   GFRNONAA >60 02/22/2020 1244    No results found for: SPEP, UPEP  Lab Results  Component Value Date   WBC 8.4 02/22/2020   NEUTROABS 5.1 02/22/2020   HGB 12.9 02/22/2020   HCT 39.2 02/22/2020   MCV 91.2 02/22/2020   PLT 308 02/22/2020      Chemistry      Component Value Date/Time   NA 138 02/22/2020 1244   K 4.0 02/22/2020 1244   CL 104 02/22/2020 1244   CO2 25 02/22/2020 1244   BUN 8 02/22/2020 1244   CREATININE 0.67 02/22/2020 1244      Component Value Date/Time   CALCIUM 9.8 02/22/2020 1244   ALKPHOS 60 02/13/2020 1222   AST 41 02/13/2020 1222   AST 47 (H) 01/12/2020 1456   ALT 24 02/13/2020 1222   ALT 36 01/12/2020 1456   BILITOT 0.3 02/13/2020 1222   BILITOT 0.4 01/12/2020 1456       RADIOGRAPHIC STUDIES: I have personally reviewed the radiological images as listed and agreed with the findings in the report. MR PELVIS W WO CONTRAST  Result Date: 02/12/2020 CLINICAL DATA:  Cervical carcinoma.  Staging. EXAM: MRI PELVIS WITHOUT AND WITH CONTRAST TECHNIQUE: Multiplanar multisequence MR imaging of the pelvis was performed both before and after administration of intravenous contrast. CONTRAST:  41mL GADAVIST GADOBUTROL 1 MMOL/ML IV SOLN COMPARISON:  None. FINDINGS: Lower Urinary Tract: Lack of fat plane noted between parametrial tumor along the right anterior lower uterine segment and posterior bladder wall (image 15/16), suspicious for bladder involvement. Bowel: Lack of fat plane noted between parametrial tumor along the right anterior lower uterine segment and adjacent small  bowel loop (image 15/16), suspicious for small bowel involvement. Vascular/Lymphatic: 8 mm right common iliac lymph node is noted on image 21/14. No pathologically enlarged pelvic lymph nodes identified. Reproductive: -- Uterus: Measures 9.0 x 4.5 by 5.4 cm (volume = 110 cm^3). A poorly-defined, infiltrative mass is seen involving cervix, lower uterine segment, and uterine corpus, which measures 6.5 x 4.4 by 4.5 cm. This mass shows deep myometrial involvement to the serosa in the lower uterine segment, and mild hydrometros, however there is no evidence of vaginal wall invasion. Loss of the serosal margin is seen along the right lateral wall of the cervix (image 18/5 and 18/6), and in the right anterior uterine segment (image 15/16), suspicious for parametrial soft tissue invasion. -- Right ovary: Appears normal. No ovarian or adnexal masses identified. -- Left ovary:  Appears normal. No ovarian or adnexal masses identified. Other: No peritoneal thickening or abnormal free fluid. Musculoskeletal:  Unremarkable. IMPRESSION: 6.5 cm infiltrative mass throughout the cervix, lower uterine segment, and uterine corpus, with mild hydrometros, consistent with known cervical carcinoma. Suspected parametrial invasion along the right lateral surgical margin and the right anterior lower uterine segment. Possible involvement of posterior bladder wall and an adjacent small bowel loop. (FIGO stage IVA) 8 mm right common iliac lymph node. Lymph node metastasis cannot be excluded. PET-CT scan could be performed for further evaluation. Electronically Signed   By: Marlaine Hind M.D.   On: 02/12/2020 09:28   CT Abdomen Pelvis W Contrast  Result Date: 01/29/2020 CLINICAL DATA:  New diagnosis cervical cancer EXAM: CT ABDOMEN AND PELVIS WITH CONTRAST TECHNIQUE: Multidetector CT imaging of the abdomen and pelvis was performed using the standard protocol following bolus administration of intravenous contrast. CONTRAST:  115mL OMNIPAQUE  IOHEXOL 300 MG/ML SOLN, additional oral enteric contrast COMPARISON:  None. FINDINGS: Lower chest: No acute abnormality. Hepatobiliary: No solid liver abnormality is seen. No gallstones, gallbladder wall thickening, or biliary dilatation. Pancreas: Unremarkable. No pancreatic ductal dilatation or surrounding inflammatory changes. Spleen: Normal in size without significant abnormality. Adrenals/Urinary Tract: Adrenal glands are unremarkable. Kidneys are normal, without renal calculi, solid lesion, or hydronephrosis. Bladder is unremarkable. Stomach/Bowel: Stomach is within normal limits. Appendix appears normal. No evidence of bowel wall thickening, distention, or inflammatory changes. Vascular/Lymphatic: Aortic atherosclerosis. No enlarged abdominal or pelvic lymph nodes. Reproductive: Probable fibroid of the anterior uterine body, not well delineated by CT (series 6, image 51). Known cervical malignancy is not well appreciated. Other: No abdominal wall hernia or abnormality. No abdominopelvic ascites. Musculoskeletal: No acute or significant osseous findings. IMPRESSION: 1. Known cervical malignancy is not well appreciated. There is no evidence of lymphadenopathy or metastatic disease in the abdomen or pelvis. 2. Probable fibroid of the anterior uterine body, not well delineated by CT. Aortic Atherosclerosis (ICD10-I70.0). Electronically Signed   By: Eddie Candle M.D.   On: 01/29/2020 08:43   NM PET Image Initial (PI) Skull Base To Thigh  Result Date: 02/21/2020 CLINICAL DATA:  Initial treatment strategy for squamous cell carcinoma of the cervix with vaginal involvement. EXAM: NUCLEAR MEDICINE PET SKULL BASE TO THIGH TECHNIQUE: 7.1 mCi F-18 FDG was injected intravenously. Full-ring PET imaging was performed from the skull base to thigh after the radiotracer. CT data was obtained and used for attenuation correction and anatomic localization. Fasting blood glucose: 89 mg/dl COMPARISON:  Multiple exams, including  01/26/2020 and MRI pelvis from 02/12/2020 FINDINGS: Mediastinal blood pool activity: SUV max 2.4 Liver activity: SUV max NA NECK: Symmetric activity along the palatine tonsils, maximum SUV 6.9 on the left and 5.9 on the right, likely physiologic. Incidental CT findings: Small internal jugular lymph nodes are not pathologically enlarged and not substantially hypermetabolic, and likely incidental. CHEST: Mildly accentuated activity along the course of the Port-A-Cath is likely incidental inflammatory activity. No hypermetabolic lesion in the chest to suggest thoracic malignancy. Incidental CT findings: The main pulmonary artery measures up to 4.9 cm in diameter compatible with pulmonary arterial aneurysm. Minimal atherosclerotic calcification of the descending thoracic aorta. Upper normal heart size. ABDOMEN/PELVIS: Hypermetabolic cervical annual lower uterine segment mass, maximum SUV 12.2 the right common iliac node noted on prior MRI currently measures 0.7 cm in short axis on image 148 of series 4 with maximum SUV of 2.3 which is similar to the blood pool. Overall no overtly hypermetabolic adenopathy is identified in  the pelvis. Linear activity along the right posterior pelvis is likely in the right distal ureter. Subtle activity along the right adnexa with maximum SUV 4.1, nonspecific but not necessarily indicative of malignancy. Physiologic activity in bowel noted. Activity along the skin crease along the lateral margin of the left labium majus, maximum SUV 7.4, with questionable cutaneous thickening/nodularity in this vicinity. Although possibly from a small amount of urinary incontinence, the possibility of a small cutaneous hypermetabolic lymph node is raised. Incidental CT findings: Aortoiliac atherosclerotic vascular disease. SKELETON: No significant abnormal hypermetabolic activity in this region. Incidental CT findings: Degenerative disc disease and degenerative endplate sclerosis at 075-GRM. IMPRESSION: 1.  Cervical and lower uterine segment mass with maximum SUV 12.2, compatible with malignancy. 2. Although the 7 mm right common iliac node questioned on recent pelvic MRI has only low-grade activity similar to blood pool on today's examination, there is a small focus of cutaneous nodularity along the lateral margin of the left labium majus which could conceivably represent a small cutaneous metastatic lymph node, although urinary contamination can sometimes cause a similar appearance. Physical exam inspection of this region along the skin fold between the left labium majus and left upper thigh region with attention to any underlying nodularity is suggested. 3. 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 If this is not a known condition, cardiology referral for further workup may be warranted. 4.  Aortic Atherosclerosis (ICD10-I70.0). Electronically Signed   By: Van Clines M.D.   On: 02/21/2020 12:42   IR IMAGING GUIDED PORT INSERTION  Result Date: 02/13/2020 INDICATION: 55 year old female with history of cervical cancer requiring central venous access for chemotherapy. EXAM: IMPLANTED PORT A CATH PLACEMENT WITH ULTRASOUND AND FLUOROSCOPIC GUIDANCE COMPARISON:  None. MEDICATIONS: Ancef 2 gm IV; The antibiotic was administered within an appropriate time interval prior to skin puncture. ANESTHESIA/SEDATION: Moderate (conscious) sedation was employed during this procedure. A total of Versed 4 mg and Fentanyl 100 mcg was administered intravenously. Moderate Sedation Time: 17 minutes. The patient's level of consciousness and vital signs were monitored continuously by radiology nursing throughout the procedure under my direct supervision. CONTRAST:  None FLUOROSCOPY TIME:  0 minutes, 6 seconds (1 mGy) COMPLICATIONS: None immediate. PROCEDURE: The procedure, risks, benefits, and alternatives were explained to the patient. Questions regarding the procedure were encouraged and answered. The  patient understands and consents to the procedure. The right neck and chest were prepped with chlorhexidine in a sterile fashion, and a sterile drape was applied covering the operative field. Maximum barrier sterile technique with sterile gowns and gloves were used for the procedure. A timeout was performed prior to the initiation of the procedure. Ultrasound was used to examine the jugular vein which was compressible and free of internal echoes. A skin marker was used to demarcate the planned venotomy and port pocket incision sites. Local anesthesia was provided to these sites and the subcutaneous tunnel track with 1% lidocaine with 1:100,000 epinephrine. A small incision was created at the jugular access site and blunt dissection was performed of the subcutaneous tissues. Under real time ultrasound guidance, the jugular vein was accessed with a 21 ga micropuncture needle and an 0.018" wire was inserted to the superior vena cava. A 5 Fr micopuncture set was then used, through which a 0.035" Rosen wire was passed under fluoroscopic guidance into the inferior vena cava. An 8 Fr dilator was then placed over the wire. A subcutaneous port pocket was then created along the upper chest wall  utilizing a combination of sharp and blunt dissection. The pocket was irrigated with sterile saline, packed with gauze, and observed for hemorrhage. A single lumen "ISP" sized power injectable port was chosen for placement. The 8 Fr catheter was tunneled from the port pocket site to the venotomy incision. The port was placed in the pocket. The external catheter was trimmed to appropriate length. The dilator was exchanged for an 8 Fr peel-away sheath under fluoroscopic guidance. The catheter was then placed through the sheath and the sheath was removed. Final catheter positioning was confirmed and documented with a fluoroscopic spot radiograph. The port was accessed with a Huber needle, aspirated, and flushed with heparinized saline.  The deep dermal layer of the port pocket incision was closed with interrupted 3-0 Vicryl suture. Dermabond was then placed over the port pocket and neck incisions. The patient tolerated the procedure well without immediate post procedural complication. FINDINGS: After catheter placement, the tip lies within the cavoatrial junction the catheter aspirates and flushes normally and is ready for immediate use. IMPRESSION: Successful placement of a power injectable Port-A-Cath via the right internal jugular vein. The catheter is ready for immediate use. Ruthann Cancer, MD [facility] Electronically Signed   By: Ruthann Cancer MD   On: 02/13/2020 16:49

## 2020-02-22 NOTE — Assessment & Plan Note (Signed)
I will start her on oral magnesium supplement

## 2020-02-22 NOTE — Assessment & Plan Note (Signed)
I encouraged her to discontinue vaping due to risk of infection while on treatment 

## 2020-02-23 ENCOUNTER — Inpatient Hospital Stay: Payer: Medicaid Other

## 2020-02-23 ENCOUNTER — Encounter: Payer: Self-pay | Admitting: Oncology

## 2020-02-23 ENCOUNTER — Other Ambulatory Visit: Payer: Self-pay

## 2020-02-23 ENCOUNTER — Other Ambulatory Visit: Payer: Self-pay | Admitting: Hematology and Oncology

## 2020-02-23 VITALS — BP 144/84 | HR 79 | Temp 98.4°F | Resp 18

## 2020-02-23 DIAGNOSIS — C539 Malignant neoplasm of cervix uteri, unspecified: Secondary | ICD-10-CM

## 2020-02-23 DIAGNOSIS — Z5111 Encounter for antineoplastic chemotherapy: Secondary | ICD-10-CM | POA: Diagnosis not present

## 2020-02-23 MED ORDER — SODIUM CHLORIDE 0.9 % IV SOLN
150.0000 mg | Freq: Once | INTRAVENOUS | Status: AC
  Administered 2020-02-23: 150 mg via INTRAVENOUS
  Filled 2020-02-23: qty 150

## 2020-02-23 MED ORDER — POTASSIUM CHLORIDE IN NACL 20-0.9 MEQ/L-% IV SOLN
Freq: Once | INTRAVENOUS | Status: AC
  Filled 2020-02-23: qty 1000

## 2020-02-23 MED ORDER — MAGNESIUM SULFATE 2 GM/50ML IV SOLN
INTRAVENOUS | Status: AC
  Filled 2020-02-23: qty 50

## 2020-02-23 MED ORDER — PALONOSETRON HCL INJECTION 0.25 MG/5ML
INTRAVENOUS | Status: AC
  Filled 2020-02-23: qty 5

## 2020-02-23 MED ORDER — MAGNESIUM SULFATE 2 GM/50ML IV SOLN
2.0000 g | Freq: Once | INTRAVENOUS | Status: AC
  Administered 2020-02-23: 2 g via INTRAVENOUS

## 2020-02-23 MED ORDER — SODIUM CHLORIDE 0.9 % IV SOLN
10.0000 mg | Freq: Once | INTRAVENOUS | Status: AC
  Administered 2020-02-23: 10 mg via INTRAVENOUS
  Filled 2020-02-23: qty 10

## 2020-02-23 MED ORDER — SODIUM CHLORIDE 0.9 % IV SOLN
Freq: Once | INTRAVENOUS | Status: AC
  Filled 2020-02-23: qty 250

## 2020-02-23 MED ORDER — SODIUM CHLORIDE 0.9 % IV SOLN
40.0000 mg/m2 | Freq: Once | INTRAVENOUS | Status: AC
  Administered 2020-02-23: 74 mg via INTRAVENOUS
  Filled 2020-02-23: qty 74

## 2020-02-23 MED ORDER — HEPARIN SOD (PORK) LOCK FLUSH 100 UNIT/ML IV SOLN
500.0000 [IU] | Freq: Once | INTRAVENOUS | Status: AC | PRN
  Administered 2020-02-23: 500 [IU]
  Filled 2020-02-23: qty 5

## 2020-02-23 MED ORDER — PALONOSETRON HCL INJECTION 0.25 MG/5ML
0.2500 mg | Freq: Once | INTRAVENOUS | Status: AC
  Administered 2020-02-23: 0.25 mg via INTRAVENOUS

## 2020-02-23 MED ORDER — SODIUM CHLORIDE 0.9% FLUSH
10.0000 mL | INTRAVENOUS | Status: DC | PRN
  Administered 2020-02-23: 10 mL
  Filled 2020-02-23: qty 10

## 2020-02-23 NOTE — Progress Notes (Signed)
Met with Brianne in the infusion room.  Went over her scheduled appointments and also gave her a printed calendar.  Encouraged her to call with any questions.

## 2020-02-23 NOTE — Patient Instructions (Signed)
[facility] Discharge Instructions for Patients Receiving Chemotherapy  Today you received the following chemotherapy agents cisplatin  To help prevent nausea and vomiting after your treatment, we encourage you to take your nausea medication as directed   If you develop nausea and vomiting that is not controlled by your nausea medication, call the clinic.   BELOW ARE SYMPTOMS THAT SHOULD BE REPORTED IMMEDIATELY:  *FEVER GREATER THAN 100.5 F  *CHILLS WITH OR WITHOUT FEVER  NAUSEA AND VOMITING THAT IS NOT CONTROLLED WITH YOUR NAUSEA MEDICATION  *UNUSUAL SHORTNESS OF BREATH  *UNUSUAL BRUISING OR BLEEDING  TENDERNESS IN MOUTH AND THROAT WITH OR WITHOUT PRESENCE OF ULCERS  *URINARY PROBLEMS  *BOWEL PROBLEMS  UNUSUAL RASH Items with * indicate a potential emergency and should be followed up as soon as possible.  Feel free to call the clinic should you have any questions or concerns. The clinic phone number is [phone].  Please show the CHEMO ALERT CARD at check-in to the Emergency Department and triage nurse.   

## 2020-02-27 DIAGNOSIS — Z510 Encounter for antineoplastic radiation therapy: Secondary | ICD-10-CM | POA: Diagnosis not present

## 2020-02-28 ENCOUNTER — Other Ambulatory Visit: Payer: Self-pay

## 2020-02-28 ENCOUNTER — Ambulatory Visit
Admission: RE | Admit: 2020-02-28 | Discharge: 2020-02-28 | Disposition: A | Discharge status: Home / Self Care | Payer: Medicaid Other | Source: Ambulatory Visit | Attending: Radiation Oncology | Admitting: Radiation Oncology

## 2020-02-28 ENCOUNTER — Inpatient Hospital Stay: Payer: Medicaid Other

## 2020-02-28 DIAGNOSIS — Z5111 Encounter for antineoplastic chemotherapy: Secondary | ICD-10-CM | POA: Diagnosis not present

## 2020-02-28 DIAGNOSIS — C539 Malignant neoplasm of cervix uteri, unspecified: Secondary | ICD-10-CM

## 2020-02-28 DIAGNOSIS — C530 Malignant neoplasm of endocervix: Secondary | ICD-10-CM

## 2020-02-28 DIAGNOSIS — Z510 Encounter for antineoplastic radiation therapy: Secondary | ICD-10-CM | POA: Diagnosis not present

## 2020-02-28 LAB — CBC WITH DIFFERENTIAL (CANCER CENTER ONLY)
Abs Immature Granulocytes: 0.03 10*3/uL (ref 0.00–0.07)
Basophils Absolute: 0.1 10*3/uL (ref 0.0–0.1)
Basophils Relative: 1 %
Eosinophils Absolute: 0.3 10*3/uL (ref 0.0–0.5)
Eosinophils Relative: 4 %
HCT: 37.8 % (ref 36.0–46.0)
Hemoglobin: 12.3 g/dL (ref 12.0–15.0)
Immature Granulocytes: 0 %
Lymphocytes Relative: 24 %
Lymphs Abs: 1.9 10*3/uL (ref 0.7–4.0)
MCH: 30.1 pg (ref 26.0–34.0)
MCHC: 32.5 g/dL (ref 30.0–36.0)
MCV: 92.4 fL (ref 80.0–100.0)
Monocytes Absolute: 1 10*3/uL (ref 0.1–1.0)
Monocytes Relative: 12 %
Neutro Abs: 4.7 10*3/uL (ref 1.7–7.7)
Neutrophils Relative %: 59 %
Platelet Count: 269 10*3/uL (ref 150–400)
RBC: 4.09 MIL/uL (ref 3.87–5.11)
RDW: 12.1 % (ref 11.5–15.5)
WBC Count: 7.9 10*3/uL (ref 4.0–10.5)
nRBC: 0 % (ref 0.0–0.2)

## 2020-02-28 LAB — BASIC METABOLIC PANEL - CANCER CENTER ONLY
Anion gap: 10 (ref 5–15)
BUN: 10 mg/dL (ref 6–20)
CO2: 24 mmol/L (ref 22–32)
Calcium: 9.3 mg/dL (ref 8.9–10.3)
Chloride: 103 mmol/L (ref 98–111)
Creatinine: 0.68 mg/dL (ref 0.44–1.00)
GFR, Estimated: >60 mL/min (ref >60)
Glucose, Bld: 89 mg/dL (ref 70–99)
Potassium: 3.9 mmol/L (ref 3.5–5.1)
Sodium: 137 mmol/L (ref 135–145)

## 2020-02-28 LAB — MAGNESIUM: Magnesium: 2 mg/dL (ref 1.7–2.4)

## 2020-02-29 ENCOUNTER — Other Ambulatory Visit: Payer: Self-pay

## 2020-02-29 ENCOUNTER — Encounter: Payer: Self-pay | Admitting: Hematology and Oncology

## 2020-02-29 ENCOUNTER — Inpatient Hospital Stay (HOSPITAL_BASED_OUTPATIENT_CLINIC_OR_DEPARTMENT_OTHER): Payer: Medicaid Other | Admitting: Hematology and Oncology

## 2020-02-29 ENCOUNTER — Ambulatory Visit
Admission: RE | Admit: 2020-02-29 | Discharge: 2020-02-29 | Disposition: A | Discharge status: Home / Self Care | Payer: Medicaid Other | Source: Ambulatory Visit | Attending: Radiation Oncology | Admitting: Radiation Oncology

## 2020-02-29 DIAGNOSIS — Z7289 Other problems related to lifestyle: Secondary | ICD-10-CM

## 2020-02-29 DIAGNOSIS — C530 Malignant neoplasm of endocervix: Secondary | ICD-10-CM | POA: Diagnosis not present

## 2020-02-29 DIAGNOSIS — Z510 Encounter for antineoplastic radiation therapy: Secondary | ICD-10-CM | POA: Diagnosis not present

## 2020-02-29 DIAGNOSIS — Z5111 Encounter for antineoplastic chemotherapy: Secondary | ICD-10-CM | POA: Diagnosis not present

## 2020-02-29 DIAGNOSIS — I10 Essential (primary) hypertension: Secondary | ICD-10-CM

## 2020-02-29 DIAGNOSIS — N950 Postmenopausal bleeding: Secondary | ICD-10-CM

## 2020-02-29 NOTE — Assessment & Plan Note (Signed)
This has improved She will continue magnesium supplement

## 2020-02-29 NOTE — Assessment & Plan Note (Signed)
Her blood pressure is stable.  She will continue amlodipine 

## 2020-02-29 NOTE — Progress Notes (Signed)
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[place]) Overall, she tolerated chemotherapy very well with minimal side effects I will continue to see her every week for toxicity review  Current every day vaping We discussed the importance of cessation of vaping She will try her best  Essential hypertension Her blood pressure is stable She will continue amlodipine  Hypomagnesemia This has improved She will continue magnesium supplement  PMB (postmenopausal bleeding) She continues to have intermittent vaginal bleeding which is not unusual while on treatment Observe closely for now   No orders of the defined types were placed in this encounter.   All questions were answered. The patient knows to call the clinic with any problems, questions or concerns. The total time spent in the appointment was 20 minutes encounter with patients including review of chart and various tests results, discussions about plan of care and coordination of care plan   Heath Lark, MD 02/29/2020 3:44 PM  INTERVAL HISTORY: Please see below for problem oriented charting. She returns for her weekly chemotherapy visit She is doing well Denies nausea No peripheral neuropathy Her appetite is fair No recent infection, fever or chills She continues to have mild intermittent vaginal bleeding but not significant  SUMMARY OF ONCOLOGIC HISTORY: Oncology History  Malignant neoplasm of cervix ([place])  12/11/2019 Initial Diagnosis   She presented with postmenopausal bleeding   01/12/2020 Initial Diagnosis   Malignant neoplasm of cervix ([place])   01/12/2020 Pathology Results   FINAL MICROSCOPIC DIAGNOSIS:   A. 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  ENDOCERVIX, CURETTAGE:  -  High grade squamous intraepithelial lesion (CIN 3; severe dysplasia/carcinoma in situ)    01/17/2020 Pathology Results   FINAL MICROSCOPIC DIAGNOSIS:   A. VAGINOCERVICAL JUNCTION, 6:00, BIOPSY:  - High grade squamous intraepithelial lesion, CIN-II/VAIN-II (moderate dysplasia).   B. VAGINOCERVICAL JUNCTION, 4:00, BIOPSY:  - High grade squamous intraepithelial lesion, CIN-III/VAIN-III (severe dysplasia/CIS).   C. VAGINOCERVICAL JUNCTION, 12:00, BIOPSY:  - Invasive squamous cell carcinoma, see comment.   D. ENDOMETRIUM, CURETTAGE:  - Predominately blood with fragments of high grade squamous intraepithelial lesion.   E. ENDOCERVIX, CURETTAGE:  - Invasive squamous cell carcinoma, see comment   01/17/2020 Surgery   Postoperative Diagnosis: Clinical stage IIB disease due to parametria thickening   Surgery: EUA, multiple cervical biopsies, ECC, EMB    Surgeons:  Jeral Pinch MD  Pathology: Biopsies at 4 and 6  o'c at the posterior cervicovaginal junction, biopsy from anterior cervix at 49 o'c, ECC, EMB   Operative findings: Cervix barrel shaped and firm, visible lesion that is friable spanning most of the posterior cervix (approximately 2 x 3.5cm). Cervix is flush with the vagina posteriorly; lesion abuts the cervico-vaginal junction. Very gritty endocervical canal noted on ECC. EMB revealed somewhat purulent appearing blood. On rectovaginal exam, concerning for thickening posteriorly in the midline and to the left, concerning for parametria involvement.   01/26/2020 Cancer Staging   Staging form: Cervix Uteri, AJCC Version 9 - Clinical stage from 01/26/2020: FIGO Stage IIB (cT2b, cN0, cM0) - Signed by Heath Lark, MD on 01/29/2020   01/26/2020 Imaging   1. Known cervical malignancy is not well appreciated. There is no evidence of lymphadenopathy  or metastatic disease in the abdomen or pelvis. 2. Probable fibroid of the anterior uterine  body, not well delineated by CT.   Aortic Atherosclerosis (ICD10-I70.0).     02/13/2020 Procedure   Successful placement of a power injectable Port-A-Cath via the right internal jugular vein. The catheter is ready for immediate use.   02/21/2020 PET scan   1. Cervical and lower uterine segment mass with maximum SUV 12.2, compatible with malignancy. 2. Although the 7 mm right common iliac node questioned on recent pelvic MRI has only low-grade activity similar to blood pool on today's examination, there is a small focus of cutaneous nodularity along the lateral margin of the left labium majus which could conceivably represent a small cutaneous metastatic lymph node, although urinary contamination can sometimes cause a similar appearance. Physical exam inspection of this region along the skin fold between the left labium majus and left upper thigh region with attention to any underlying nodularity is suggested. 3. 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 If this is not a known condition, cardiology referral for further workup may be warranted. 4.  Aortic Atherosclerosis (ICD10-I70.0).   02/23/2020 -  Chemotherapy    Patient is on Treatment Plan: CERVICAL CISPLATIN Q7D        REVIEW OF SYSTEMS:   Constitutional: Denies fevers, chills or abnormal weight loss Eyes: Denies blurriness of vision Ears, nose, mouth, throat, and face: Denies mucositis or sore throat Respiratory: Denies cough, dyspnea or wheezes Cardiovascular: Denies palpitation, chest discomfort or lower extremity swelling Gastrointestinal:  Denies nausea, heartburn or change in bowel habits Skin: Denies abnormal skin rashes Lymphatics: Denies new lymphadenopathy or easy bruising Neurological:Denies numbness, tingling or new weaknesses Behavioral/Psych: Mood is stable, no new changes  All other systems were reviewed with the patient and are negative.  I have reviewed the past medical history, past  surgical history, social history and family history with the patient and they are unchanged from previous note.  ALLERGIES:  has No Known Allergies.  MEDICATIONS:  Current Outpatient Medications  Medication Sig Dispense Refill  . acetaminophen (TYLENOL) 650 MG CR tablet Take 1,300 mg by mouth every 8 (eight) hours as needed for pain.    Marland Kitchen amLODipine (NORVASC) 5 MG tablet Take 1 tablet (5 mg total) by mouth daily. 30 tablet 11  . Chlorpheniramine Maleate (ALLERGY PO) Take 1 tablet by mouth daily.     . famotidine (PEPCID) 20 MG tablet Take 20 mg by mouth daily. OTC    . lidocaine-prilocaine (EMLA) cream Apply to affected area once 30 g 3  . magnesium oxide (MAG-OX) 400 (241.3 Mg) MG tablet Take 1 tablet (400 mg total) by mouth daily. 30 tablet 1  . Multiple Vitamin (MULTIVITAMIN) capsule Take 1 capsule by mouth daily.    . ondansetron (ZOFRAN) 8 MG tablet Take 1 tablet (8 mg total) by mouth every 8 (eight) hours as needed. Start on the third day after cisplatin chemotherapy. 30 tablet 1  . prochlorperazine (COMPAZINE) 10 MG tablet Take 1 tablet (10 mg total) by mouth every 6 (six) hours as needed (Nausea or vomiting). 30 tablet 1   No current facility-administered medications for this visit.    PHYSICAL EXAMINATION: ECOG PERFORMANCE STATUS: 0 - Asymptomatic  Vitals:   02/29/20 1259  BP: (!) 149/71  Pulse: 78  Resp: 16  Temp: 98.4 F (36.9 C)  SpO2: 100%   Filed Weights   02/29/20 1259  Weight: 163 lb 3.2 oz (74 kg)  GENERAL:alert, no distress and comfortable SKIN: skin color, texture, turgor are normal, no rashes or significant lesions EYES: normal, Conjunctiva are pink and non-injected, sclera clear OROPHARYNX:no exudate, no erythema and lips, buccal mucosa, and tongue normal  NECK: supple, thyroid normal size, non-tender, without nodularity LYMPH:  no palpable lymphadenopathy in the cervical, axillary or inguinal LUNGS: clear to auscultation and percussion with normal  breathing effort HEART: regular rate & rhythm and no murmurs and no lower extremity edema ABDOMEN:abdomen soft, non-tender and normal bowel sounds Musculoskeletal:no cyanosis of digits and no clubbing  NEURO: alert & oriented x 3 with fluent speech, no focal motor/sensory deficits  LABORATORY DATA:  I have reviewed the data as listed    Component Value Date/Time   NA 137 02/28/2020 1329   K 3.9 02/28/2020 1329   CL 103 02/28/2020 1329   CO2 24 02/28/2020 1329   GLUCOSE 89 02/28/2020 1329   BUN 10 02/28/2020 1329   CREATININE 0.68 02/28/2020 1329   CALCIUM 9.3 02/28/2020 1329   PROT 8.9 (H) 02/13/2020 1222   ALBUMIN 4.1 02/13/2020 1222   AST 41 02/13/2020 1222   AST 47 (H) 01/12/2020 1456   ALT 24 02/13/2020 1222   ALT 36 01/12/2020 1456   ALKPHOS 60 02/13/2020 1222   BILITOT 0.3 02/13/2020 1222   BILITOT 0.4 01/12/2020 1456   GFRNONAA >60 02/28/2020 1329    No results found for: SPEP, UPEP  Lab Results  Component Value Date   WBC 7.9 02/28/2020   NEUTROABS 4.7 02/28/2020   HGB 12.3 02/28/2020   HCT 37.8 02/28/2020   MCV 92.4 02/28/2020   PLT 269 02/28/2020      Chemistry      Component Value Date/Time   NA 137 02/28/2020 1329   K 3.9 02/28/2020 1329   CL 103 02/28/2020 1329   CO2 24 02/28/2020 1329   BUN 10 02/28/2020 1329   CREATININE 0.68 02/28/2020 1329      Component Value Date/Time   CALCIUM 9.3 02/28/2020 1329   ALKPHOS 60 02/13/2020 1222   AST 41 02/13/2020 1222   AST 47 (H) 01/12/2020 1456   ALT 24 02/13/2020 1222   ALT 36 01/12/2020 1456   BILITOT 0.3 02/13/2020 1222   BILITOT 0.4 01/12/2020 1456       RADIOGRAPHIC STUDIES: I have personally reviewed the radiological images as listed and agreed with the findings in the report. MR PELVIS W WO CONTRAST  Result Date: 02/12/2020 CLINICAL DATA:  Cervical carcinoma.  Staging. EXAM: MRI PELVIS WITHOUT AND WITH CONTRAST TECHNIQUE: Multiplanar multisequence MR imaging of the pelvis was performed  both before and after administration of intravenous contrast. CONTRAST:  37mL GADAVIST GADOBUTROL 1 MMOL/ML IV SOLN COMPARISON:  None. FINDINGS: Lower Urinary Tract: Lack of fat plane noted between parametrial tumor along the right anterior lower uterine segment and posterior bladder wall (image 15/16), suspicious for bladder involvement. Bowel: Lack of fat plane noted between parametrial tumor along the right anterior lower uterine segment and adjacent small bowel loop (image 15/16), suspicious for small bowel involvement. Vascular/Lymphatic: 8 mm right common iliac lymph node is noted on image 21/14. No pathologically enlarged pelvic lymph nodes identified. Reproductive: -- Uterus: Measures 9.0 x 4.5 by 5.4 cm (volume = 110 cm^3). A poorly-defined, infiltrative mass is seen involving cervix, lower uterine segment, and uterine corpus, which measures 6.5 x 4.4 by 4.5 cm. This mass shows deep myometrial involvement to the serosa in the lower uterine segment, and mild hydrometros, however there  is no evidence of vaginal wall invasion. Loss of the serosal margin is seen along the right lateral wall of the cervix (image 18/5 and 18/6), and in the right anterior uterine segment (image 15/16), suspicious for parametrial soft tissue invasion. -- Right ovary: Appears normal. No ovarian or adnexal masses identified. -- Left ovary: Appears normal. No ovarian or adnexal masses identified. Other: No peritoneal thickening or abnormal free fluid. Musculoskeletal:  Unremarkable. IMPRESSION: 6.5 cm infiltrative mass throughout the cervix, lower uterine segment, and uterine corpus, with mild hydrometros, consistent with known cervical carcinoma. Suspected parametrial invasion along the right lateral surgical margin and the right anterior lower uterine segment. Possible involvement of posterior bladder wall and an adjacent small bowel loop. (FIGO stage IVA) 8 mm right common iliac lymph node. Lymph node metastasis cannot be excluded.  PET-CT scan could be performed for further evaluation. Electronically Signed   By: Marlaine Hind M.D.   On: 02/12/2020 09:28   NM PET Image Initial (PI) Skull Base To Thigh  Result Date: 02/21/2020 CLINICAL DATA:  Initial treatment strategy for squamous cell carcinoma of the cervix with vaginal involvement. EXAM: NUCLEAR MEDICINE PET SKULL BASE TO THIGH TECHNIQUE: 7.1 mCi F-18 FDG was injected intravenously. Full-ring PET imaging was performed from the skull base to thigh after the radiotracer. CT data was obtained and used for attenuation correction and anatomic localization. Fasting blood glucose: 89 mg/dl COMPARISON:  Multiple exams, including 01/26/2020 and MRI pelvis from 02/12/2020 FINDINGS: Mediastinal blood pool activity: SUV max 2.4 Liver activity: SUV max NA NECK: Symmetric activity along the palatine tonsils, maximum SUV 6.9 on the left and 5.9 on the right, likely physiologic. Incidental CT findings: Small internal jugular lymph nodes are not pathologically enlarged and not substantially hypermetabolic, and likely incidental. CHEST: Mildly accentuated activity along the course of the Port-A-Cath is likely incidental inflammatory activity. No hypermetabolic lesion in the chest to suggest thoracic malignancy. Incidental CT findings: The main pulmonary artery measures up to 4.9 cm in diameter compatible with pulmonary arterial aneurysm. Minimal atherosclerotic calcification of the descending thoracic aorta. Upper normal heart size. ABDOMEN/PELVIS: Hypermetabolic cervical annual lower uterine segment mass, maximum SUV 12.2 the right common iliac node noted on prior MRI currently measures 0.7 cm in short axis on image 148 of series 4 with maximum SUV of 2.3 which is similar to the blood pool. Overall no overtly hypermetabolic adenopathy is identified in the pelvis. Linear activity along the right posterior pelvis is likely in the right distal ureter. Subtle activity along the right adnexa with maximum SUV  4.1, nonspecific but not necessarily indicative of malignancy. Physiologic activity in bowel noted. Activity along the skin crease along the lateral margin of the left labium majus, maximum SUV 7.4, with questionable cutaneous thickening/nodularity in this vicinity. Although possibly from a small amount of urinary incontinence, the possibility of a small cutaneous hypermetabolic lymph node is raised. Incidental CT findings: Aortoiliac atherosclerotic vascular disease. SKELETON: No significant abnormal hypermetabolic activity in this region. Incidental CT findings: Degenerative disc disease and degenerative endplate sclerosis at 075-GRM. IMPRESSION: 1. Cervical and lower uterine segment mass with maximum SUV 12.2, compatible with malignancy. 2. Although the 7 mm right common iliac node questioned on recent pelvic MRI has only low-grade activity similar to blood pool on today's examination, there is a small focus of cutaneous nodularity along the lateral margin of the left labium majus which could conceivably represent a small cutaneous metastatic lymph node, although urinary contamination can sometimes cause a similar appearance.  Physical exam inspection of this region along the skin fold between the left labium majus and left upper thigh region with attention to any underlying nodularity is suggested. 3. 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 If this is not a known condition, cardiology referral for further workup may be warranted. 4.  Aortic Atherosclerosis (ICD10-I70.0). Electronically Signed   By: Van Clines M.D.   On: 02/21/2020 12:42   IR IMAGING GUIDED PORT INSERTION  Result Date: 02/13/2020 INDICATION: 55 year old female with history of cervical cancer requiring central venous access for chemotherapy. EXAM: IMPLANTED PORT A CATH PLACEMENT WITH ULTRASOUND AND FLUOROSCOPIC GUIDANCE COMPARISON:  None. MEDICATIONS: Ancef 2 gm IV; The antibiotic was administered within an  appropriate time interval prior to skin puncture. ANESTHESIA/SEDATION: Moderate (conscious) sedation was employed during this procedure. A total of Versed 4 mg and Fentanyl 100 mcg was administered intravenously. Moderate Sedation Time: 17 minutes. The patient's level of consciousness and vital signs were monitored continuously by radiology nursing throughout the procedure under my direct supervision. CONTRAST:  None FLUOROSCOPY TIME:  0 minutes, 6 seconds (1 mGy) COMPLICATIONS: None immediate. PROCEDURE: The procedure, risks, benefits, and alternatives were explained to the patient. Questions regarding the procedure were encouraged and answered. The patient understands and consents to the procedure. The right neck and chest were prepped with chlorhexidine in a sterile fashion, and a sterile drape was applied covering the operative field. Maximum barrier sterile technique with sterile gowns and gloves were used for the procedure. A timeout was performed prior to the initiation of the procedure. Ultrasound was used to examine the jugular vein which was compressible and free of internal echoes. A skin marker was used to demarcate the planned venotomy and port pocket incision sites. Local anesthesia was provided to these sites and the subcutaneous tunnel track with 1% lidocaine with 1:100,000 epinephrine. A small incision was created at the jugular access site and blunt dissection was performed of the subcutaneous tissues. Under real time ultrasound guidance, the jugular vein was accessed with a 21 ga micropuncture needle and an 0.018" wire was inserted to the superior vena cava. A 5 Fr micopuncture set was then used, through which a 0.035" Rosen wire was passed under fluoroscopic guidance into the inferior vena cava. An 8 Fr dilator was then placed over the wire. A subcutaneous port pocket was then created along the upper chest wall utilizing a combination of sharp and blunt dissection. The pocket was irrigated with  sterile saline, packed with gauze, and observed for hemorrhage. A single lumen "ISP" sized power injectable port was chosen for placement. The 8 Fr catheter was tunneled from the port pocket site to the venotomy incision. The port was placed in the pocket. The external catheter was trimmed to appropriate length. The dilator was exchanged for an 8 Fr peel-away sheath under fluoroscopic guidance. The catheter was then placed through the sheath and the sheath was removed. Final catheter positioning was confirmed and documented with a fluoroscopic spot radiograph. The port was accessed with a Huber needle, aspirated, and flushed with heparinized saline. The deep dermal layer of the port pocket incision was closed with interrupted 3-0 Vicryl suture. Dermabond was then placed over the port pocket and neck incisions. The patient tolerated the procedure well without immediate post procedural complication. FINDINGS: After catheter placement, the tip lies within the cavoatrial junction the catheter aspirates and flushes normally and is ready for immediate use. IMPRESSION: Successful placement of a power injectable Port-A-Cath via  the right internal jugular vein. The catheter is ready for immediate use. Ruthann Cancer, MD [facility]iology Electronically Signed   By: Ruthann Cancer MD   On: 02/13/2020 16:49

## 2020-02-29 NOTE — Assessment & Plan Note (Signed)
She continues to have intermittent vaginal bleeding which is not unusual while on treatment Observe closely for now

## 2020-02-29 NOTE — Assessment & Plan Note (Signed)
Overall, she tolerated chemotherapy very well with minimal side effects I will continue to see her every week for toxicity review

## 2020-02-29 NOTE — Assessment & Plan Note (Signed)
We discussed the importance of cessation of vaping She will try her best 

## 2020-03-01 ENCOUNTER — Other Ambulatory Visit: Payer: Self-pay

## 2020-03-01 ENCOUNTER — Inpatient Hospital Stay: Payer: Medicaid Other

## 2020-03-01 ENCOUNTER — Ambulatory Visit
Admission: RE | Admit: 2020-03-01 | Discharge: 2020-03-01 | Disposition: A | Discharge status: Home / Self Care | Payer: Medicaid Other | Source: Ambulatory Visit | Attending: Radiation Oncology | Admitting: Radiation Oncology

## 2020-03-01 VITALS — BP 143/75 | HR 77 | Temp 98.7°F | Resp 16

## 2020-03-01 DIAGNOSIS — Z5111 Encounter for antineoplastic chemotherapy: Secondary | ICD-10-CM | POA: Diagnosis not present

## 2020-03-01 DIAGNOSIS — C539 Malignant neoplasm of cervix uteri, unspecified: Secondary | ICD-10-CM

## 2020-03-01 DIAGNOSIS — Z510 Encounter for antineoplastic radiation therapy: Secondary | ICD-10-CM | POA: Diagnosis not present

## 2020-03-01 MED ORDER — PALONOSETRON HCL INJECTION 0.25 MG/5ML
INTRAVENOUS | Status: AC
  Filled 2020-03-01: qty 5

## 2020-03-01 MED ORDER — PALONOSETRON HCL INJECTION 0.25 MG/5ML
0.2500 mg | Freq: Once | INTRAVENOUS | Status: AC
  Administered 2020-03-01: 0.25 mg via INTRAVENOUS

## 2020-03-01 MED ORDER — POTASSIUM CHLORIDE IN NACL 20-0.9 MEQ/L-% IV SOLN
Freq: Once | INTRAVENOUS | Status: AC
  Filled 2020-03-01: qty 1000

## 2020-03-01 MED ORDER — FOSAPREPITANT DIMEGLUMINE INJECTION 150 MG
150.0000 mg | Freq: Once | INTRAVENOUS | Status: AC
  Administered 2020-03-01: 150 mg via INTRAVENOUS
  Filled 2020-03-01: qty 5
  Filled 2020-03-01: qty 150

## 2020-03-01 MED ORDER — SODIUM CHLORIDE 0.9 % IV SOLN
Freq: Once | INTRAVENOUS | Status: AC
  Filled 2020-03-01: qty 250

## 2020-03-01 MED ORDER — SODIUM CHLORIDE 0.9 % IV SOLN
40.0000 mg/m2 | Freq: Once | INTRAVENOUS | Status: AC
  Administered 2020-03-01: 74 mg via INTRAVENOUS
  Filled 2020-03-01: qty 74

## 2020-03-01 MED ORDER — SODIUM CHLORIDE 0.9% FLUSH
10.0000 mL | INTRAVENOUS | Status: DC | PRN
  Administered 2020-03-01: 10 mL
  Filled 2020-03-01: qty 10

## 2020-03-01 MED ORDER — HEPARIN SOD (PORK) LOCK FLUSH 100 UNIT/ML IV SOLN
500.0000 [IU] | Freq: Once | INTRAVENOUS | Status: AC | PRN
  Administered 2020-03-01: 500 [IU]
  Filled 2020-03-01: qty 5

## 2020-03-01 MED ORDER — MAGNESIUM SULFATE 2 GM/50ML IV SOLN
2.0000 g | Freq: Once | INTRAVENOUS | Status: AC
  Administered 2020-03-01: 2 g via INTRAVENOUS

## 2020-03-01 MED ORDER — SODIUM CHLORIDE 0.9 % IV SOLN
10.0000 mg | Freq: Once | INTRAVENOUS | Status: AC
  Administered 2020-03-01: 10 mg via INTRAVENOUS
  Filled 2020-03-01: qty 1
  Filled 2020-03-01: qty 10

## 2020-03-01 MED ORDER — MAGNESIUM SULFATE 2 GM/50ML IV SOLN
INTRAVENOUS | Status: AC
  Filled 2020-03-01: qty 50

## 2020-03-01 NOTE — Patient Instructions (Signed)
[facility] Discharge Instructions for Patients Receiving Chemotherapy  Today you received the following chemotherapy agents Cisplatin  To help prevent nausea and vomiting after your treatment, we encourage you to take your nausea medication as directed  If you develop nausea and vomiting that is not controlled by your nausea medication, call the clinic.   BELOW ARE SYMPTOMS THAT SHOULD BE REPORTED IMMEDIATELY:  *FEVER GREATER THAN 100.5 F  *CHILLS WITH OR WITHOUT FEVER  NAUSEA AND VOMITING THAT IS NOT CONTROLLED WITH YOUR NAUSEA MEDICATION  *UNUSUAL SHORTNESS OF BREATH  *UNUSUAL BRUISING OR BLEEDING  TENDERNESS IN MOUTH AND THROAT WITH OR WITHOUT PRESENCE OF ULCERS  *URINARY PROBLEMS  *BOWEL PROBLEMS  UNUSUAL RASH Items with * indicate a potential emergency and should be followed up as soon as possible.  Feel free to call the clinic should you have any questions or concerns. The clinic phone number is [phone].  Please show the CHEMO ALERT CARD at check-in to the Emergency Department and triage nurse.   

## 2020-03-04 ENCOUNTER — Other Ambulatory Visit: Payer: Self-pay

## 2020-03-04 ENCOUNTER — Ambulatory Visit
Admission: RE | Admit: 2020-03-04 | Discharge: 2020-03-04 | Disposition: A | Discharge status: Home / Self Care | Payer: Medicaid Other | Source: Ambulatory Visit | Attending: Radiation Oncology | Admitting: Radiation Oncology

## 2020-03-04 ENCOUNTER — Ambulatory Visit: Payer: Medicaid Other | Admitting: Radiation Oncology

## 2020-03-04 DIAGNOSIS — Z510 Encounter for antineoplastic radiation therapy: Secondary | ICD-10-CM | POA: Diagnosis not present

## 2020-03-05 ENCOUNTER — Ambulatory Visit: Payer: Medicaid Other

## 2020-03-05 ENCOUNTER — Ambulatory Visit
Admission: RE | Admit: 2020-03-05 | Discharge: 2020-03-05 | Disposition: A | Discharge status: Home / Self Care | Payer: Medicaid Other | Source: Ambulatory Visit | Attending: Radiation Oncology | Admitting: Radiation Oncology

## 2020-03-05 DIAGNOSIS — Z510 Encounter for antineoplastic radiation therapy: Secondary | ICD-10-CM | POA: Diagnosis not present

## 2020-03-06 ENCOUNTER — Ambulatory Visit: Payer: Medicaid Other

## 2020-03-06 ENCOUNTER — Inpatient Hospital Stay: Payer: Medicaid Other

## 2020-03-06 ENCOUNTER — Other Ambulatory Visit: Payer: Self-pay

## 2020-03-06 ENCOUNTER — Ambulatory Visit
Admission: RE | Admit: 2020-03-06 | Discharge: 2020-03-06 | Disposition: A | Discharge status: Home / Self Care | Payer: Medicaid Other | Source: Ambulatory Visit | Attending: Radiation Oncology | Admitting: Radiation Oncology

## 2020-03-06 DIAGNOSIS — C539 Malignant neoplasm of cervix uteri, unspecified: Secondary | ICD-10-CM

## 2020-03-06 DIAGNOSIS — Z5111 Encounter for antineoplastic chemotherapy: Secondary | ICD-10-CM | POA: Diagnosis not present

## 2020-03-06 DIAGNOSIS — Z510 Encounter for antineoplastic radiation therapy: Secondary | ICD-10-CM | POA: Diagnosis not present

## 2020-03-06 LAB — CBC WITH DIFFERENTIAL (CANCER CENTER ONLY)
Abs Immature Granulocytes: 0.05 10*3/uL (ref 0.00–0.07)
Basophils Absolute: 0 10*3/uL (ref 0.0–0.1)
Basophils Relative: 1 %
Eosinophils Absolute: 0.2 10*3/uL (ref 0.0–0.5)
Eosinophils Relative: 3 %
HCT: 33.5 % — ABNORMAL LOW (ref 36.0–46.0)
Hemoglobin: 11.2 g/dL — ABNORMAL LOW (ref 12.0–15.0)
Immature Granulocytes: 1 %
Lymphocytes Relative: 10 %
Lymphs Abs: 0.9 10*3/uL (ref 0.7–4.0)
MCH: 30.7 pg (ref 26.0–34.0)
MCHC: 33.4 g/dL (ref 30.0–36.0)
MCV: 91.8 fL (ref 80.0–100.0)
Monocytes Absolute: 0.7 10*3/uL (ref 0.1–1.0)
Monocytes Relative: 8 %
Neutro Abs: 6.6 10*3/uL (ref 1.7–7.7)
Neutrophils Relative %: 77 %
Platelet Count: 253 10*3/uL (ref 150–400)
RBC: 3.65 MIL/uL — ABNORMAL LOW (ref 3.87–5.11)
RDW: 12.2 % (ref 11.5–15.5)
WBC Count: 8.5 10*3/uL (ref 4.0–10.5)
nRBC: 0 % (ref 0.0–0.2)

## 2020-03-06 LAB — BASIC METABOLIC PANEL - CANCER CENTER ONLY
Anion gap: 9 (ref 5–15)
BUN: 8 mg/dL (ref 6–20)
CO2: 25 mmol/L (ref 22–32)
Calcium: 8.8 mg/dL — ABNORMAL LOW (ref 8.9–10.3)
Chloride: 105 mmol/L (ref 98–111)
Creatinine: 0.6 mg/dL (ref 0.44–1.00)
GFR, Estimated: >60 mL/min (ref >60)
Glucose, Bld: 95 mg/dL (ref 70–99)
Potassium: 3.8 mmol/L (ref 3.5–5.1)
Sodium: 139 mmol/L (ref 135–145)

## 2020-03-06 LAB — MAGNESIUM: Magnesium: 1.9 mg/dL (ref 1.7–2.4)

## 2020-03-06 MED ORDER — HEPARIN SOD (PORK) LOCK FLUSH 100 UNIT/ML IV SOLN
500.0000 [IU] | Freq: Once | INTRAVENOUS | Status: AC
  Administered 2020-03-06: 500 [IU]
  Filled 2020-03-06: qty 5

## 2020-03-06 MED ORDER — SODIUM CHLORIDE 0.9% FLUSH
10.0000 mL | Freq: Once | INTRAVENOUS | Status: AC
  Administered 2020-03-06: 10 mL
  Filled 2020-03-06: qty 10

## 2020-03-07 ENCOUNTER — Other Ambulatory Visit: Payer: Self-pay

## 2020-03-07 ENCOUNTER — Encounter: Payer: Self-pay | Admitting: Hematology and Oncology

## 2020-03-07 ENCOUNTER — Ambulatory Visit
Admission: RE | Admit: 2020-03-07 | Discharge: 2020-03-07 | Disposition: A | Discharge status: Home / Self Care | Payer: Medicaid Other | Source: Ambulatory Visit | Attending: Radiation Oncology | Admitting: Radiation Oncology

## 2020-03-07 ENCOUNTER — Ambulatory Visit: Payer: Medicaid Other

## 2020-03-07 ENCOUNTER — Inpatient Hospital Stay (HOSPITAL_BASED_OUTPATIENT_CLINIC_OR_DEPARTMENT_OTHER): Payer: Medicaid Other | Admitting: Hematology and Oncology

## 2020-03-07 DIAGNOSIS — C530 Malignant neoplasm of endocervix: Secondary | ICD-10-CM

## 2020-03-07 DIAGNOSIS — D6481 Anemia due to antineoplastic chemotherapy: Secondary | ICD-10-CM | POA: Diagnosis not present

## 2020-03-07 DIAGNOSIS — G893 Neoplasm related pain (acute) (chronic): Secondary | ICD-10-CM | POA: Diagnosis not present

## 2020-03-07 DIAGNOSIS — T451X5A Adverse effect of antineoplastic and immunosuppressive drugs, initial encounter: Secondary | ICD-10-CM | POA: Diagnosis not present

## 2020-03-07 DIAGNOSIS — Z5111 Encounter for antineoplastic chemotherapy: Secondary | ICD-10-CM | POA: Diagnosis not present

## 2020-03-07 DIAGNOSIS — Z510 Encounter for antineoplastic radiation therapy: Secondary | ICD-10-CM | POA: Diagnosis not present

## 2020-03-07 MED ORDER — AMLODIPINE BESYLATE 10 MG PO TABS: 10.0000 mg | ORAL_TABLET | Freq: Every day | ORAL | 1 refills | Status: DC

## 2020-03-07 MED ORDER — OXYCODONE HCL 5 MG PO TABS: 5.0000 mg | ORAL_TABLET | Freq: Four times a day (QID) | ORAL | 0 refills | Status: DC | PRN

## 2020-03-07 NOTE — Assessment & Plan Note (Signed)
Overall, she tolerated chemotherapy well, however, complicated by recent more bleeding, abdominal cramp and loose stool She will continue treatment as scheduled I will see her on a weekly basis for further follow-up

## 2020-03-07 NOTE — Assessment & Plan Note (Signed)

## 2020-03-07 NOTE — Progress Notes (Signed)
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 The patient denies recent history of bleeding such as epistaxis, hematuria or hematochezia. She is asymptomatic from the anemia. I will observe for now.  She does not require transfusion now. I will continue the chemotherapy at current dose without dosage adjustment.  If the anemia gets progressive worse in the future, I might have to delay her treatment or adjust the chemotherapy dose.    No orders of the defined types were placed in this encounter.   All questions were answered. The patient knows to call the clinic with any problems, questions or concerns. The total time spent in the appointment was 20 minutes encounter with patients including review of chart and various tests results, discussions about plan of care and coordination of care plan   Heath Lark, MD 03/07/2020 3:32 PM  INTERVAL HISTORY: Please see below for problem oriented charting. 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 CERVIX, 3:00, 5:00, BIOPSY:  -  Invasive squamous cell carcinoma arising in a background of high-grade dysplasia  -  See comment   B.  ENDOCERVIX, CURETTAGE:  -  High grade squamous intraepithelial lesion (CIN 3; severe dysplasia/carcinoma in situ)    01/17/2020 Pathology Results   FINAL MICROSCOPIC DIAGNOSIS:   A. VAGINOCERVICAL JUNCTION, 6:00, BIOPSY:  - High grade squamous intraepithelial lesion, CIN-II/VAIN-II (moderate dysplasia).   B. VAGINOCERVICAL JUNCTION, 4:00, BIOPSY:  - High grade squamous intraepithelial lesion, CIN-III/VAIN-III (severe dysplasia/CIS).   C. VAGINOCERVICAL JUNCTION, 12:00, BIOPSY:  - Invasive squamous cell carcinoma, see comment.   D. ENDOMETRIUM, CURETTAGE:  - Predominately blood with fragments of high grade squamous intraepithelial lesion.   E. ENDOCERVIX, CURETTAGE:  - Invasive squamous cell carcinoma, see comment   01/17/2020 Surgery   Postoperative Diagnosis: Clinical stage IIB disease due to parametria thickening   Surgery: EUA, multiple cervical biopsies, ECC, EMB    Surgeons:  Jeral Pinch MD  Pathology: Biopsies at 4 and 6  o'c at the posterior cervicovaginal junction, biopsy from anterior cervix at 26 o'c, ECC, EMB   Operative findings: Cervix barrel shaped and firm, visible lesion that is friable spanning most of the posterior cervix (approximately 2 x 3.5cm). Cervix is flush with the vagina posteriorly; lesion abuts the cervico-vaginal junction. Very gritty endocervical canal noted on ECC. EMB revealed somewhat purulent appearing blood. On  rectovaginal exam, concerning for thickening posteriorly in the midline and to the left, concerning for parametria  involvement.   01/26/2020 Cancer Staging   Staging form: Cervix Uteri, AJCC Version 9 - Clinical stage from 01/26/2020: FIGO Stage IIB (cT2b, cN0, cM0) - Signed by Heath Lark, MD on 01/29/2020   01/26/2020 Imaging   1. Known cervical malignancy is not well appreciated. There is no evidence of lymphadenopathy or metastatic disease in the abdomen or pelvis. 2. Probable fibroid of the anterior uterine body, not well delineated by CT.   Aortic Atherosclerosis (ICD10-I70.0).     02/13/2020 Procedure   Successful placement of a power injectable Port-A-Cath via the right internal jugular vein. The catheter is ready for immediate use.   02/21/2020 PET scan   1. Cervical and lower uterine segment mass with maximum SUV 12.2, compatible with malignancy. 2. Although the 7 mm right common iliac node questioned on recent pelvic MRI has only low-grade activity similar to blood pool on today's examination, there is a small focus of cutaneous nodularity along the lateral margin of the left labium majus which could conceivably represent a small cutaneous metastatic lymph node, although urinary contamination can sometimes cause a similar appearance. Physical exam inspection of this region along the skin fold between the left labium majus and left upper thigh region with attention to any underlying nodularity is suggested. 3. 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 If this is not a known condition, cardiology referral for further workup may be warranted. 4.  Aortic Atherosclerosis (ICD10-I70.0).   02/23/2020 -  Chemotherapy    Patient is on Treatment Plan: CERVICAL CISPLATIN Q7D        REVIEW OF SYSTEMS:   Constitutional: Denies fevers, chills or abnormal weight loss Eyes: Denies blurriness of vision Ears, nose, mouth, throat, and face: Denies mucositis or sore throat Respiratory: Denies cough, dyspnea or wheezes Cardiovascular: Denies palpitation, chest discomfort or  lower extremity swelling Skin: Denies abnormal skin rashes Lymphatics: Denies new lymphadenopathy or easy bruising Neurological:Denies numbness, tingling or new weaknesses Behavioral/Psych: Mood is stable, no new changes  All other systems were reviewed with the patient and are negative.  I have reviewed the past medical history, past surgical history, social history and family history with the patient and they are unchanged from previous note.  ALLERGIES:  has No Known Allergies.  MEDICATIONS:  Current Outpatient Medications  Medication Sig Dispense Refill  . oxyCODONE (OXY IR/ROXICODONE) 5 MG immediate release tablet Take 1 tablet (5 mg total) by mouth every 6 (six) hours as needed for severe pain. 30 tablet 0  . acetaminophen (TYLENOL) 650 MG CR tablet Take 1,300 mg by mouth every 8 (eight) hours as needed for pain.    Marland Kitchen amLODipine (NORVASC) 10 MG tablet Take 1 tablet (10 mg total) by mouth daily. 30 tablet 1  . Chlorpheniramine Maleate (ALLERGY PO) Take 1 tablet by mouth daily.     . famotidine (PEPCID) 20 MG tablet Take 20 mg by mouth daily. OTC    . lidocaine-prilocaine (EMLA) cream Apply to affected area once 30 g 3  . magnesium oxide (MAG-OX) 400 (241.3 Mg) MG tablet Take 1 tablet (400 mg total) by mouth daily. 30 tablet 1  . Multiple Vitamin (MULTIVITAMIN) capsule Take 1 capsule by mouth daily.    . ondansetron (ZOFRAN) 8 MG tablet Take 1 tablet (8 mg total) by mouth every 8 (eight) hours as needed. Start on the third day after cisplatin chemotherapy. Banks  tablet 1  . prochlorperazine (COMPAZINE) 10 MG tablet Take 1 tablet (10 mg total) by mouth every 6 (six) hours as needed (Nausea or vomiting). 30 tablet 1   No current facility-administered medications for this visit.    PHYSICAL EXAMINATION: ECOG PERFORMANCE STATUS: 1 - Symptomatic but completely ambulatory  Vitals:   03/07/20 1405  BP: (!) 165/71  Pulse: 75  Resp: 18  Temp: 97.6 F (36.4 C)  SpO2: 100%   Filed  Weights   03/07/20 1405  Weight: 161 lb 3.2 oz (73.1 kg)    GENERAL:alert, no distress and comfortable NEURO: alert & oriented x 3 with fluent speech, no focal motor/sensory deficits  LABORATORY DATA:  I have reviewed the data as listed    Component Value Date/Time   NA 139 03/06/2020 1523   K 3.8 03/06/2020 1523   CL 105 03/06/2020 1523   CO2 25 03/06/2020 1523   GLUCOSE 95 03/06/2020 1523   BUN 8 03/06/2020 1523   CREATININE 0.60 03/06/2020 1523   CALCIUM 8.8 (L) 03/06/2020 1523   PROT 8.9 (H) 02/13/2020 1222   ALBUMIN 4.1 02/13/2020 1222   AST 41 02/13/2020 1222   AST 47 (H) 01/12/2020 1456   ALT 24 02/13/2020 1222   ALT 36 01/12/2020 1456   ALKPHOS 60 02/13/2020 1222   BILITOT 0.3 02/13/2020 1222   BILITOT 0.4 01/12/2020 1456   GFRNONAA >60 03/06/2020 1523    No results found for: SPEP, UPEP  Lab Results  Component Value Date   WBC 8.5 03/06/2020   NEUTROABS 6.6 03/06/2020   HGB 11.2 (L) 03/06/2020   HCT 33.5 (L) 03/06/2020   MCV 91.8 03/06/2020   PLT 253 03/06/2020      Chemistry      Component Value Date/Time   NA 139 03/06/2020 1523   K 3.8 03/06/2020 1523   CL 105 03/06/2020 1523   CO2 25 03/06/2020 1523   BUN 8 03/06/2020 1523   CREATININE 0.60 03/06/2020 1523      Component Value Date/Time   CALCIUM 8.8 (L) 03/06/2020 1523   ALKPHOS 60 02/13/2020 1222   AST 41 02/13/2020 1222   AST 47 (H) 01/12/2020 1456   ALT 24 02/13/2020 1222   ALT 36 01/12/2020 1456   BILITOT 0.3 02/13/2020 1222   BILITOT 0.4 01/12/2020 1456       RADIOGRAPHIC STUDIES: I have personally reviewed the radiological images as listed and agreed with the findings in the report. MR PELVIS W WO CONTRAST  Result Date: 02/12/2020 CLINICAL DATA:  Cervical carcinoma.  Staging. EXAM: MRI PELVIS WITHOUT AND WITH CONTRAST TECHNIQUE: Multiplanar multisequence MR imaging of the pelvis was performed both before and after administration of intravenous contrast. CONTRAST:  50mL GADAVIST  GADOBUTROL 1 MMOL/ML IV SOLN COMPARISON:  None. FINDINGS: Lower Urinary Tract: Lack of fat plane noted between parametrial tumor along the right anterior lower uterine segment and posterior bladder wall (image 15/16), suspicious for bladder involvement. Bowel: Lack of fat plane noted between parametrial tumor along the right anterior lower uterine segment and adjacent small bowel loop (image 15/16), suspicious for small bowel involvement. Vascular/Lymphatic: 8 mm right common iliac lymph node is noted on image 21/14. No pathologically enlarged pelvic lymph nodes identified. Reproductive: -- Uterus: Measures 9.0 x 4.5 by 5.4 cm (volume = 110 cm^3). A poorly-defined, infiltrative mass is seen involving cervix, lower uterine segment, and uterine corpus, which measures 6.5 x 4.4 by 4.5 cm. This mass shows deep myometrial involvement to the serosa  in the lower uterine segment, and mild hydrometros, however there is no evidence of vaginal wall invasion. Loss of the serosal margin is seen along the right lateral wall of the cervix (image 18/5 and 18/6), and in the right anterior uterine segment (image 15/16), suspicious for parametrial soft tissue invasion. -- Right ovary: Appears normal. No ovarian or adnexal masses identified. -- Left ovary: Appears normal. No ovarian or adnexal masses identified. Other: No peritoneal thickening or abnormal free fluid. Musculoskeletal:  Unremarkable. IMPRESSION: 6.5 cm infiltrative mass throughout the cervix, lower uterine segment, and uterine corpus, with mild hydrometros, consistent with known cervical carcinoma. Suspected parametrial invasion along the right lateral surgical margin and the right anterior lower uterine segment. Possible involvement of posterior bladder wall and an adjacent small bowel loop. (FIGO stage IVA) 8 mm right common iliac lymph node. Lymph node metastasis cannot be excluded. PET-CT scan could be performed for further evaluation. Electronically Signed   By:  Marlaine Hind M.D.   On: 02/12/2020 09:28   NM PET Image Initial (PI) Skull Base To Thigh  Result Date: 02/21/2020 CLINICAL DATA:  Initial treatment strategy for squamous cell carcinoma of the cervix with vaginal involvement. EXAM: NUCLEAR MEDICINE PET SKULL BASE TO THIGH TECHNIQUE: 7.1 mCi F-18 FDG was injected intravenously. Full-ring PET imaging was performed from the skull base to thigh after the radiotracer. CT data was obtained and used for attenuation correction and anatomic localization. Fasting blood glucose: 89 mg/dl COMPARISON:  Multiple exams, including 01/26/2020 and MRI pelvis from 02/12/2020 FINDINGS: Mediastinal blood pool activity: SUV max 2.4 Liver activity: SUV max NA NECK: Symmetric activity along the palatine tonsils, maximum SUV 6.9 on the left and 5.9 on the right, likely physiologic. Incidental CT findings: Small internal jugular lymph nodes are not pathologically enlarged and not substantially hypermetabolic, and likely incidental. CHEST: Mildly accentuated activity along the course of the Port-A-Cath is likely incidental inflammatory activity. No hypermetabolic lesion in the chest to suggest thoracic malignancy. Incidental CT findings: The main pulmonary artery measures up to 4.9 cm in diameter compatible with pulmonary arterial aneurysm. Minimal atherosclerotic calcification of the descending thoracic aorta. Upper normal heart size. ABDOMEN/PELVIS: Hypermetabolic cervical annual lower uterine segment mass, maximum SUV 12.2 the right common iliac node noted on prior MRI currently measures 0.7 cm in short axis on image 148 of series 4 with maximum SUV of 2.3 which is similar to the blood pool. Overall no overtly hypermetabolic adenopathy is identified in the pelvis. Linear activity along the right posterior pelvis is likely in the right distal ureter. Subtle activity along the right adnexa with maximum SUV 4.1, nonspecific but not necessarily indicative of malignancy. Physiologic activity  in bowel noted. Activity along the skin crease along the lateral margin of the left labium majus, maximum SUV 7.4, with questionable cutaneous thickening/nodularity in this vicinity. Although possibly from a small amount of urinary incontinence, the possibility of a small cutaneous hypermetabolic lymph node is raised. Incidental CT findings: Aortoiliac atherosclerotic vascular disease. SKELETON: No significant abnormal hypermetabolic activity in this region. Incidental CT findings: Degenerative disc disease and degenerative endplate sclerosis at [id number]-GRM. IMPRESSION: 1. Cervical and lower uterine segment mass with maximum SUV 12.2, compatible with malignancy. 2. Although the 7 mm right common iliac node questioned on recent pelvic MRI has only low-grade activity similar to blood pool on today's examination, there is a small focus of cutaneous nodularity along the lateral margin of the left labium majus which could conceivably represent a small cutaneous metastatic lymph  node, although urinary contamination can sometimes cause a similar appearance. Physical exam inspection of this region along the skin fold between the left labium majus and left upper thigh region with attention to any underlying nodularity is suggested. 3. 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 If this is not a known condition, cardiology referral for further workup may be warranted. 4.  Aortic Atherosclerosis (ICD10-I70.0). Electronically Signed   By: Van Clines M.D.   On: 02/21/2020 12:42   IR IMAGING GUIDED PORT INSERTION  Result Date: 02/13/2020 INDICATION: 55 year old female with history of cervical cancer requiring central venous access for chemotherapy. EXAM: IMPLANTED PORT A CATH PLACEMENT WITH ULTRASOUND AND FLUOROSCOPIC GUIDANCE COMPARISON:  None. MEDICATIONS: Ancef 2 gm IV; The antibiotic was administered within an appropriate time interval prior to skin puncture. ANESTHESIA/SEDATION: Moderate  (conscious) sedation was employed during this procedure. A total of Versed 4 mg and Fentanyl 100 mcg was administered intravenously. Moderate Sedation Time: 17 minutes. The patient's level of consciousness and vital signs were monitored continuously by radiology nursing throughout the procedure under my direct supervision. CONTRAST:  None FLUOROSCOPY TIME:  0 minutes, 6 seconds (1 mGy) COMPLICATIONS: None immediate. PROCEDURE: The procedure, risks, benefits, and alternatives were explained to the patient. Questions regarding the procedure were encouraged and answered. The patient understands and consents to the procedure. The right neck and chest were prepped with chlorhexidine in a sterile fashion, and a sterile drape was applied covering the operative field. Maximum barrier sterile technique with sterile gowns and gloves were used for the procedure. A timeout was performed prior to the initiation of the procedure. Ultrasound was used to examine the jugular vein which was compressible and free of internal echoes. A skin marker was used to demarcate the planned venotomy and port pocket incision sites. Local anesthesia was provided to these sites and the subcutaneous tunnel track with 1% lidocaine with 1:100,000 epinephrine. A small incision was created at the jugular access site and blunt dissection was performed of the subcutaneous tissues. Under real time ultrasound guidance, the jugular vein was accessed with a 21 ga micropuncture needle and an 0.018" wire was inserted to the superior vena cava. A 5 Fr micopuncture set was then used, through which a 0.035" Rosen wire was passed under fluoroscopic guidance into the inferior vena cava. An 8 Fr dilator was then placed over the wire. A subcutaneous port pocket was then created along the upper chest wall utilizing a combination of sharp and blunt dissection. The pocket was irrigated with sterile saline, packed with gauze, and observed for hemorrhage. A single lumen  "ISP" sized power injectable port was chosen for placement. The 8 Fr catheter was tunneled from the port pocket site to the venotomy incision. The port was placed in the pocket. The external catheter was trimmed to appropriate length. The dilator was exchanged for an 8 Fr peel-away sheath under fluoroscopic guidance. The catheter was then placed through the sheath and the sheath was removed. Final catheter positioning was confirmed and documented with a fluoroscopic spot radiograph. The port was accessed with a Huber needle, aspirated, and flushed with heparinized saline. The deep dermal layer of the port pocket incision was closed with interrupted 3-0 Vicryl suture. Dermabond was then placed over the port pocket and neck incisions. The patient tolerated the procedure well without immediate post procedural complication. FINDINGS: After catheter placement, the tip lies within the cavoatrial junction the catheter aspirates and flushes normally and is ready for immediate  use. IMPRESSION: Successful placement of a power injectable Port-A-Cath via the right internal jugular vein. The catheter is ready for immediate use. Ruthann Cancer, MD [facility] Electronically Signed   By: Ruthann Cancer MD   On: 02/13/2020 16:49

## 2020-03-07 NOTE — Assessment & Plan Note (Signed)
She has cancer associated pain We discussed conservative approach and narcotic prescription She is willing to try low-dose oxycodone I warned her about risk of side effects such as sedation and constipation We discussed narcotic refill policy

## 2020-03-08 ENCOUNTER — Inpatient Hospital Stay: Payer: Medicaid Other

## 2020-03-08 ENCOUNTER — Other Ambulatory Visit: Payer: Self-pay

## 2020-03-08 ENCOUNTER — Ambulatory Visit: Payer: Medicaid Other

## 2020-03-08 ENCOUNTER — Ambulatory Visit
Admission: RE | Admit: 2020-03-08 | Discharge: 2020-03-08 | Disposition: A | Discharge status: Home / Self Care | Payer: Medicaid Other | Source: Ambulatory Visit | Attending: Radiation Oncology | Admitting: Radiation Oncology

## 2020-03-08 VITALS — BP 144/79 | HR 79 | Temp 99.2°F | Resp 18

## 2020-03-08 DIAGNOSIS — C539 Malignant neoplasm of cervix uteri, unspecified: Secondary | ICD-10-CM

## 2020-03-08 DIAGNOSIS — Z510 Encounter for antineoplastic radiation therapy: Secondary | ICD-10-CM | POA: Diagnosis not present

## 2020-03-08 DIAGNOSIS — Z5111 Encounter for antineoplastic chemotherapy: Secondary | ICD-10-CM | POA: Diagnosis not present

## 2020-03-08 MED ORDER — SODIUM CHLORIDE 0.9% FLUSH
10.0000 mL | INTRAVENOUS | Status: DC | PRN
  Administered 2020-03-08: 10 mL
  Filled 2020-03-08: qty 10

## 2020-03-08 MED ORDER — PALONOSETRON HCL INJECTION 0.25 MG/5ML
0.2500 mg | Freq: Once | INTRAVENOUS | Status: AC
  Administered 2020-03-08: 0.25 mg via INTRAVENOUS

## 2020-03-08 MED ORDER — SODIUM CHLORIDE 0.9 % IV SOLN
150.0000 mg | Freq: Once | INTRAVENOUS | Status: AC
  Administered 2020-03-08: 150 mg via INTRAVENOUS
  Filled 2020-03-08: qty 150

## 2020-03-08 MED ORDER — MAGNESIUM SULFATE 2 GM/50ML IV SOLN
INTRAVENOUS | Status: AC
  Filled 2020-03-08: qty 50

## 2020-03-08 MED ORDER — SODIUM CHLORIDE 0.9 % IV SOLN
Freq: Once | INTRAVENOUS | Status: AC
  Filled 2020-03-08: qty 250

## 2020-03-08 MED ORDER — SODIUM CHLORIDE 0.9 % IV SOLN
40.0000 mg/m2 | Freq: Once | INTRAVENOUS | Status: AC
  Administered 2020-03-08: 74 mg via INTRAVENOUS
  Filled 2020-03-08: qty 74

## 2020-03-08 MED ORDER — PALONOSETRON HCL INJECTION 0.25 MG/5ML
INTRAVENOUS | Status: AC
  Filled 2020-03-08: qty 5

## 2020-03-08 MED ORDER — POTASSIUM CHLORIDE IN NACL 20-0.9 MEQ/L-% IV SOLN
Freq: Once | INTRAVENOUS | Status: AC
  Filled 2020-03-08: qty 1000

## 2020-03-08 MED ORDER — HEPARIN SOD (PORK) LOCK FLUSH 100 UNIT/ML IV SOLN
500.0000 [IU] | Freq: Once | INTRAVENOUS | Status: AC | PRN
  Administered 2020-03-08: 500 [IU]
  Filled 2020-03-08: qty 5

## 2020-03-08 MED ORDER — MAGNESIUM SULFATE 2 GM/50ML IV SOLN
2.0000 g | Freq: Once | INTRAVENOUS | Status: AC
  Administered 2020-03-08: 2 g via INTRAVENOUS

## 2020-03-08 MED ORDER — SODIUM CHLORIDE 0.9 % IV SOLN
10.0000 mg | Freq: Once | INTRAVENOUS | Status: AC
  Administered 2020-03-08: 10 mg via INTRAVENOUS
  Filled 2020-03-08: qty 10

## 2020-03-08 NOTE — Patient Instructions (Signed)
[facility] Discharge Instructions for Patients Receiving Chemotherapy  Today you received the following chemotherapy agents: Cisplatin  To help prevent nausea and vomiting after your treatment, we encourage you to take your nausea medication  as prescribed.    If you develop nausea and vomiting that is not controlled by your nausea medication, call the clinic.   BELOW ARE SYMPTOMS THAT SHOULD BE REPORTED IMMEDIATELY:  *FEVER GREATER THAN 100.5 F  *CHILLS WITH OR WITHOUT FEVER  NAUSEA AND VOMITING THAT IS NOT CONTROLLED WITH YOUR NAUSEA MEDICATION  *UNUSUAL SHORTNESS OF BREATH  *UNUSUAL BRUISING OR BLEEDING  TENDERNESS IN MOUTH AND THROAT WITH OR WITHOUT PRESENCE OF ULCERS  *URINARY PROBLEMS  *BOWEL PROBLEMS  UNUSUAL RASH Items with * indicate a potential emergency and should be followed up as soon as possible.  Feel free to call the clinic should you have any questions or concerns. The clinic phone number is [phone].  Please show the CHEMO ALERT CARD at check-in to the Emergency Department and triage nurse.   

## 2020-03-11 ENCOUNTER — Ambulatory Visit
Admission: RE | Admit: 2020-03-11 | Discharge: 2020-03-11 | Disposition: A | Discharge status: Home / Self Care | Payer: Medicaid Other | Source: Ambulatory Visit | Attending: Radiation Oncology | Admitting: Radiation Oncology

## 2020-03-11 ENCOUNTER — Ambulatory Visit: Payer: Medicaid Other

## 2020-03-11 ENCOUNTER — Other Ambulatory Visit: Payer: Self-pay

## 2020-03-11 DIAGNOSIS — Z510 Encounter for antineoplastic radiation therapy: Secondary | ICD-10-CM | POA: Diagnosis not present

## 2020-03-12 ENCOUNTER — Ambulatory Visit: Payer: Medicaid Other

## 2020-03-12 ENCOUNTER — Ambulatory Visit
Admission: RE | Admit: 2020-03-12 | Discharge: 2020-03-12 | Disposition: A | Discharge status: Home / Self Care | Payer: Medicaid Other | Source: Ambulatory Visit | Attending: Radiation Oncology | Admitting: Radiation Oncology

## 2020-03-12 DIAGNOSIS — Z510 Encounter for antineoplastic radiation therapy: Secondary | ICD-10-CM | POA: Insufficient documentation

## 2020-03-12 DIAGNOSIS — C530 Malignant neoplasm of endocervix: Secondary | ICD-10-CM | POA: Insufficient documentation

## 2020-03-13 ENCOUNTER — Ambulatory Visit
Admission: RE | Admit: 2020-03-13 | Discharge: 2020-03-13 | Disposition: A | Discharge status: Home / Self Care | Payer: Medicaid Other | Source: Ambulatory Visit | Attending: Radiation Oncology | Admitting: Radiation Oncology

## 2020-03-13 ENCOUNTER — Inpatient Hospital Stay: Payer: Medicaid Other

## 2020-03-13 ENCOUNTER — Other Ambulatory Visit: Payer: Self-pay

## 2020-03-13 ENCOUNTER — Ambulatory Visit: Payer: Medicaid Other

## 2020-03-13 ENCOUNTER — Inpatient Hospital Stay: Payer: Medicaid Other | Attending: Gynecologic Oncology

## 2020-03-13 DIAGNOSIS — I10 Essential (primary) hypertension: Secondary | ICD-10-CM | POA: Insufficient documentation

## 2020-03-13 DIAGNOSIS — T451X5A Adverse effect of antineoplastic and immunosuppressive drugs, initial encounter: Secondary | ICD-10-CM | POA: Insufficient documentation

## 2020-03-13 DIAGNOSIS — Z510 Encounter for antineoplastic radiation therapy: Secondary | ICD-10-CM | POA: Diagnosis not present

## 2020-03-13 DIAGNOSIS — D6481 Anemia due to antineoplastic chemotherapy: Secondary | ICD-10-CM | POA: Insufficient documentation

## 2020-03-13 DIAGNOSIS — G893 Neoplasm related pain (acute) (chronic): Secondary | ICD-10-CM | POA: Diagnosis not present

## 2020-03-13 DIAGNOSIS — C539 Malignant neoplasm of cervix uteri, unspecified: Secondary | ICD-10-CM | POA: Insufficient documentation

## 2020-03-13 DIAGNOSIS — R030 Elevated blood-pressure reading, without diagnosis of hypertension: Secondary | ICD-10-CM | POA: Diagnosis not present

## 2020-03-13 DIAGNOSIS — F17210 Nicotine dependence, cigarettes, uncomplicated: Secondary | ICD-10-CM | POA: Insufficient documentation

## 2020-03-13 DIAGNOSIS — D61818 Other pancytopenia: Secondary | ICD-10-CM | POA: Diagnosis not present

## 2020-03-13 DIAGNOSIS — I700 Atherosclerosis of aorta: Secondary | ICD-10-CM | POA: Insufficient documentation

## 2020-03-13 DIAGNOSIS — Z79899 Other long term (current) drug therapy: Secondary | ICD-10-CM | POA: Diagnosis not present

## 2020-03-13 DIAGNOSIS — Z5111 Encounter for antineoplastic chemotherapy: Secondary | ICD-10-CM | POA: Insufficient documentation

## 2020-03-13 LAB — CBC WITH DIFFERENTIAL (CANCER CENTER ONLY)
Abs Immature Granulocytes: 0.01 10*3/uL (ref 0.00–0.07)
Basophils Absolute: 0 10*3/uL (ref 0.0–0.1)
Basophils Relative: 1 %
Eosinophils Absolute: 0.2 10*3/uL (ref 0.0–0.5)
Eosinophils Relative: 6 %
HCT: 32.2 % — ABNORMAL LOW (ref 36.0–46.0)
Hemoglobin: 10.7 g/dL — ABNORMAL LOW (ref 12.0–15.0)
Immature Granulocytes: 0 %
Lymphocytes Relative: 21 %
Lymphs Abs: 0.9 10*3/uL (ref 0.7–4.0)
MCH: 30.5 pg (ref 26.0–34.0)
MCHC: 33.2 g/dL (ref 30.0–36.0)
MCV: 91.7 fL (ref 80.0–100.0)
Monocytes Absolute: 0.6 10*3/uL (ref 0.1–1.0)
Monocytes Relative: 13 %
Neutro Abs: 2.5 10*3/uL (ref 1.7–7.7)
Neutrophils Relative %: 59 %
Platelet Count: 157 10*3/uL (ref 150–400)
RBC: 3.51 MIL/uL — ABNORMAL LOW (ref 3.87–5.11)
RDW: 12.8 % (ref 11.5–15.5)
WBC Count: 4.2 10*3/uL (ref 4.0–10.5)
nRBC: 0 % (ref 0.0–0.2)

## 2020-03-13 LAB — BASIC METABOLIC PANEL - CANCER CENTER ONLY
Anion gap: 6 (ref 5–15)
BUN: 6 mg/dL (ref 6–20)
CO2: 26 mmol/L (ref 22–32)
Calcium: 8.7 mg/dL — ABNORMAL LOW (ref 8.9–10.3)
Chloride: 104 mmol/L (ref 98–111)
Creatinine: 0.63 mg/dL (ref 0.44–1.00)
GFR, Estimated: >60 mL/min (ref >60)
Glucose, Bld: 93 mg/dL (ref 70–99)
Potassium: 3.7 mmol/L (ref 3.5–5.1)
Sodium: 136 mmol/L (ref 135–145)

## 2020-03-13 LAB — MAGNESIUM: Magnesium: 1.8 mg/dL (ref 1.7–2.4)

## 2020-03-13 MED ORDER — HEPARIN SOD (PORK) LOCK FLUSH 100 UNIT/ML IV SOLN
500.0000 [IU] | Freq: Once | INTRAVENOUS | Status: AC
  Administered 2020-03-13: 500 [IU]
  Filled 2020-03-13: qty 5

## 2020-03-13 MED ORDER — SODIUM CHLORIDE 0.9% FLUSH
10.0000 mL | Freq: Once | INTRAVENOUS | Status: AC
  Administered 2020-03-13: 10 mL
  Filled 2020-03-13: qty 10

## 2020-03-14 ENCOUNTER — Ambulatory Visit
Admission: RE | Admit: 2020-03-14 | Discharge: 2020-03-14 | Disposition: A | Discharge status: Home / Self Care | Payer: Medicaid Other | Source: Ambulatory Visit | Attending: Radiation Oncology | Admitting: Radiation Oncology

## 2020-03-14 ENCOUNTER — Other Ambulatory Visit: Payer: Self-pay | Admitting: Hematology and Oncology

## 2020-03-14 ENCOUNTER — Ambulatory Visit: Payer: Medicaid Other

## 2020-03-14 ENCOUNTER — Inpatient Hospital Stay (HOSPITAL_BASED_OUTPATIENT_CLINIC_OR_DEPARTMENT_OTHER): Payer: Medicaid Other | Admitting: Hematology and Oncology

## 2020-03-14 ENCOUNTER — Other Ambulatory Visit: Payer: Self-pay

## 2020-03-14 ENCOUNTER — Encounter: Payer: Self-pay | Admitting: Hematology and Oncology

## 2020-03-14 DIAGNOSIS — T451X5A Adverse effect of antineoplastic and immunosuppressive drugs, initial encounter: Secondary | ICD-10-CM

## 2020-03-14 DIAGNOSIS — I10 Essential (primary) hypertension: Secondary | ICD-10-CM | POA: Diagnosis not present

## 2020-03-14 DIAGNOSIS — Z5111 Encounter for antineoplastic chemotherapy: Secondary | ICD-10-CM | POA: Diagnosis not present

## 2020-03-14 DIAGNOSIS — C530 Malignant neoplasm of endocervix: Secondary | ICD-10-CM

## 2020-03-14 DIAGNOSIS — Z7289 Other problems related to lifestyle: Secondary | ICD-10-CM

## 2020-03-14 DIAGNOSIS — D6481 Anemia due to antineoplastic chemotherapy: Secondary | ICD-10-CM | POA: Diagnosis not present

## 2020-03-14 DIAGNOSIS — G893 Neoplasm related pain (acute) (chronic): Secondary | ICD-10-CM | POA: Diagnosis not present

## 2020-03-14 DIAGNOSIS — Z510 Encounter for antineoplastic radiation therapy: Secondary | ICD-10-CM | POA: Diagnosis not present

## 2020-03-14 NOTE — Assessment & Plan Note (Signed)
We discussed the importance of cessation of vaping She will try her best

## 2020-03-14 NOTE — Assessment & Plan Note (Signed)
She has cancer associated pain Her pain is better controlled She will continue to take oxycodone as needed We discussed narcotic refill policy

## 2020-03-14 NOTE — Assessment & Plan Note (Signed)

## 2020-03-14 NOTE — Progress Notes (Signed)
[place] OFFICE PROGRESS NOTE  Patient Care Team: Patient, No Pcp Per as PCP - General (General Practice) Awanda Mink, Craige Cotta, RN as Oncology Nurse Navigator (Oncology)  ASSESSMENT & PLAN:  Malignant neoplasm of cervix [facility]) She tolerated treatment better this week Overall, given her young age, I felt we should extend her concurrent chemo therapy with radiation from 5 doses to 6 doses I will get that appointment added to her schedule I will continue to see her on a weekly basis for supportive care and toxicity review  Anemia due to antineoplastic chemotherapy This is likely due to recent treatment. The patient denies recent history of bleeding such as epistaxis, hematuria or hematochezia. She is asymptomatic from the anemia. I will observe for now.  She does not require transfusion now. I will continue the chemotherapy at current dose without dosage adjustment.  If the anemia gets progressive worse in the future, I might have to delay her treatment or adjust the chemotherapy dose.   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   All questions were answered. The patient knows to call the clinic with any problems, questions or concerns. The total time spent in the appointment was 20 minutes encounter with patients including review of chart and various tests results, discussions about plan of care and coordination of care plan   Heath Lark, MD 03/14/2020 2:07 PM  INTERVAL HISTORY: Please see below for problem oriented charting. She returns for further follow-up She is doing well She  tolerated treatment better Her pain is better controlled She has mild loose stool She has mild vaginal bleeding last week No peripheral neuropathy  SUMMARY OF ONCOLOGIC HISTORY: Oncology History  Malignant neoplasm of cervix ([place])  12/11/2019 Initial Diagnosis   She presented with postmenopausal bleeding   01/12/2020 Initial Diagnosis   Malignant neoplasm of cervix ([place])   01/12/2020 Pathology Results   FINAL MICROSCOPIC DIAGNOSIS:   A. CERVIX, 3:00, 5:00, BIOPSY:  -  Invasive squamous cell carcinoma arising in a background of high-grade dysplasia  -  See comment   B.  ENDOCERVIX, CURETTAGE:  -  High grade squamous intraepithelial lesion (CIN 3; severe dysplasia/carcinoma in situ)    01/17/2020 Pathology Results   FINAL MICROSCOPIC DIAGNOSIS:   A. VAGINOCERVICAL JUNCTION, 6:00, BIOPSY:  - High grade squamous intraepithelial lesion, CIN-II/VAIN-II (moderate dysplasia).   B. VAGINOCERVICAL JUNCTION, 4:00, BIOPSY:  - High grade squamous intraepithelial lesion, CIN-III/VAIN-III (severe dysplasia/CIS).   C. VAGINOCERVICAL JUNCTION, 12:00, BIOPSY:  - Invasive squamous cell carcinoma, see comment.   D. ENDOMETRIUM, CURETTAGE:  - Predominately blood with fragments of high grade squamous intraepithelial lesion.   E. ENDOCERVIX, CURETTAGE:  - Invasive squamous cell carcinoma, see comment   01/17/2020 Surgery   Postoperative Diagnosis: Clinical stage IIB disease due to parametria thickening   Surgery: EUA, multiple cervical biopsies, ECC, EMB    Surgeons:  Jeral Pinch MD  Pathology: Biopsies at 4 and 6  o'c at the posterior cervicovaginal junction, biopsy from anterior cervix at 27 o'c, ECC, EMB   Operative findings: Cervix barrel shaped and firm, visible lesion that is friable spanning most of  the posterior cervix (approximately 2 x 3.5cm). Cervix is flush with the vagina posteriorly; lesion abuts the cervico-vaginal junction. Very gritty endocervical canal noted on ECC.  EMB revealed somewhat purulent appearing blood. On rectovaginal exam, concerning for thickening posteriorly in the midline and to the left, concerning for parametria involvement.   01/26/2020 Cancer Staging   Staging form: Cervix Uteri, AJCC Version 9 - Clinical stage from 01/26/2020: FIGO Stage IIB (cT2b, cN0, cM0) - Signed by Heath Lark, MD on 01/29/2020   01/26/2020 Imaging   1. Known cervical malignancy is not well appreciated. There is no evidence of lymphadenopathy or metastatic disease in the abdomen or pelvis. 2. Probable fibroid of the anterior uterine body, not well delineated by CT.   Aortic Atherosclerosis (ICD10-I70.0).     02/13/2020 Procedure   Successful placement of a power injectable Port-A-Cath via the right internal jugular vein. The catheter is ready for immediate use.   02/21/2020 PET scan   1. Cervical and lower uterine segment mass with maximum SUV 12.2, compatible with malignancy. 2. Although the 7 mm right common iliac node questioned on recent pelvic MRI has only low-grade activity similar to blood pool on today's examination, there is a small focus of cutaneous nodularity along the lateral margin of the left labium majus which could conceivably represent a small cutaneous metastatic lymph node, although urinary contamination can sometimes cause a similar appearance. Physical exam inspection of this region along the skin fold between the left labium majus and left upper thigh region with attention to any underlying nodularity is suggested. 3. 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 If this is not a known condition, cardiology referral for further workup may be warranted. 4.  Aortic Atherosclerosis (ICD10-I70.0).   02/23/2020 -  Chemotherapy    Patient is on Treatment Plan: CERVICAL CISPLATIN Q7D        REVIEW OF SYSTEMS:   Constitutional: Denies fevers, chills or abnormal weight loss Eyes: Denies blurriness of vision Ears, nose,  mouth, throat, and face: Denies mucositis or sore throat Respiratory: Denies cough, dyspnea or wheezes Cardiovascular: Denies palpitation, chest discomfort or lower extremity swelling Skin: Denies abnormal skin rashes Lymphatics: Denies new lymphadenopathy or easy bruising Neurological:Denies numbness, tingling or new weaknesses Behavioral/Psych: Mood is stable, no new changes  All other systems were reviewed with the patient and are negative.  I have reviewed the past medical history, past surgical history, social history and family history with the patient and they are unchanged from previous note.  ALLERGIES:  has No Known Allergies.  MEDICATIONS:  Current Outpatient Medications  Medication Sig Dispense Refill  . acetaminophen (TYLENOL) 650 MG CR tablet Take 1,300 mg by mouth every 8 (eight) hours as needed for pain.    Marland Kitchen amLODipine (NORVASC) 10 MG tablet Take 1 tablet (10 mg total) by mouth daily. 30 tablet 1  . Chlorpheniramine Maleate (ALLERGY PO) Take 1 tablet by mouth daily.     . famotidine (PEPCID) 20 MG tablet Take 20 mg by mouth daily. OTC    . lidocaine-prilocaine (EMLA) cream Apply to affected area once 30 g 3  . magnesium oxide (MAG-OX) 400 (241.3 Mg) MG tablet Take 1 tablet (400 mg total) by mouth daily. 30 tablet 1  . Multiple Vitamin (MULTIVITAMIN) capsule Take 1 capsule by mouth daily.    . ondansetron (ZOFRAN) 8 MG tablet Take 1 tablet (8 mg total) by mouth every 8 (eight) hours as needed. Start on the third day after  cisplatin chemotherapy. 30 tablet 1  . oxyCODONE (OXY IR/ROXICODONE) 5 MG immediate release tablet Take 1 tablet (5 mg total) by mouth every 6 (six) hours as needed for severe pain. 30 tablet 0  . prochlorperazine (COMPAZINE) 10 MG tablet Take 1 tablet (10 mg total) by mouth every 6 (six) hours as needed (Nausea or vomiting). 30 tablet 1   No current facility-administered medications for this visit.    PHYSICAL EXAMINATION: ECOG PERFORMANCE STATUS: 1 -  Symptomatic but completely ambulatory  Vitals:   03/14/20 1354  BP: (!) 151/80  Pulse: 70  Resp: 16  Temp: 98.2 F (36.8 C)  SpO2: 100%   Filed Weights   03/14/20 1354  Weight: 156 lb (70.8 kg)    GENERAL:alert, no distress and comfortable SKIN: skin color, texture, turgor are normal, no rashes or significant lesions EYES: normal, Conjunctiva are pink and non-injected, sclera clear OROPHARYNX:no exudate, no erythema and lips, buccal mucosa, and tongue normal  NECK: supple, thyroid normal size, non-tender, without nodularity LYMPH:  no palpable lymphadenopathy in the cervical, axillary or inguinal LUNGS: clear to auscultation and percussion with normal breathing effort HEART: regular rate & rhythm and no murmurs and no lower extremity edema ABDOMEN:abdomen soft, non-tender and normal bowel sounds Musculoskeletal:no cyanosis of digits and no clubbing  NEURO: alert & oriented x 3 with fluent speech, no focal motor/sensory deficits  LABORATORY DATA:  I have reviewed the data as listed    Component Value Date/Time   NA 136 03/13/2020 1416   K 3.7 03/13/2020 1416   CL 104 03/13/2020 1416   CO2 26 03/13/2020 1416   GLUCOSE 93 03/13/2020 1416   BUN 6 03/13/2020 1416   CREATININE 0.63 03/13/2020 1416   CALCIUM 8.7 (L) 03/13/2020 1416   PROT 8.9 (H) 02/13/2020 1222   ALBUMIN 4.1 02/13/2020 1222   AST 41 02/13/2020 1222   AST 47 (H) 01/12/2020 1456   ALT 24 02/13/2020 1222   ALT 36 01/12/2020 1456   ALKPHOS 60 02/13/2020 1222   BILITOT 0.3 02/13/2020 1222   BILITOT 0.4 01/12/2020 1456   GFRNONAA >60 03/13/2020 1416    No results found for: SPEP, UPEP  Lab Results  Component Value Date   WBC 4.2 03/13/2020   NEUTROABS 2.5 03/13/2020   HGB 10.7 (L) 03/13/2020   HCT 32.2 (L) 03/13/2020   MCV 91.7 03/13/2020   PLT 157 03/13/2020      Chemistry      Component Value Date/Time   NA 136 03/13/2020 1416   K 3.7 03/13/2020 1416   CL 104 03/13/2020 1416   CO2 26  03/13/2020 1416   BUN 6 03/13/2020 1416   CREATININE 0.63 03/13/2020 1416      Component Value Date/Time   CALCIUM 8.7 (L) 03/13/2020 1416   ALKPHOS 60 02/13/2020 1222   AST 41 02/13/2020 1222   AST 47 (H) 01/12/2020 1456   ALT 24 02/13/2020 1222   ALT 36 01/12/2020 1456   BILITOT 0.3 02/13/2020 1222   BILITOT 0.4 01/12/2020 1456       RADIOGRAPHIC STUDIES: I have personally reviewed the radiological images as listed and agreed with the findings in the report. NM PET Image Initial (PI) Skull Base To Thigh  Result Date: 02/21/2020 CLINICAL DATA:  Initial treatment strategy for squamous cell carcinoma of the cervix with vaginal involvement. EXAM: NUCLEAR MEDICINE PET SKULL BASE TO THIGH TECHNIQUE: 7.1 mCi F-18 FDG was injected intravenously. Full-ring PET imaging was performed from the skull base  to thigh after the radiotracer. CT data was obtained and used for attenuation correction and anatomic localization. Fasting blood glucose: 89 mg/dl COMPARISON:  Multiple exams, including 01/26/2020 and MRI pelvis from 02/12/2020 FINDINGS: Mediastinal blood pool activity: SUV max 2.4 Liver activity: SUV max NA NECK: Symmetric activity along the palatine tonsils, maximum SUV 6.9 on the left and 5.9 on the right, likely physiologic. Incidental CT findings: Small internal jugular lymph nodes are not pathologically enlarged and not substantially hypermetabolic, and likely incidental. CHEST: Mildly accentuated activity along the course of the Port-A-Cath is likely incidental inflammatory activity. No hypermetabolic lesion in the chest to suggest thoracic malignancy. Incidental CT findings: The main pulmonary artery measures up to 4.9 cm in diameter compatible with pulmonary arterial aneurysm. Minimal atherosclerotic calcification of the descending thoracic aorta. Upper normal heart size. ABDOMEN/PELVIS: Hypermetabolic cervical annual lower uterine segment mass, maximum SUV 12.2 the right common iliac node noted  on prior MRI currently measures 0.7 cm in short axis on image 148 of series 4 with maximum SUV of 2.3 which is similar to the blood pool. Overall no overtly hypermetabolic adenopathy is identified in the pelvis. Linear activity along the right posterior pelvis is likely in the right distal ureter. Subtle activity along the right adnexa with maximum SUV 4.1, nonspecific but not necessarily indicative of malignancy. Physiologic activity in bowel noted. Activity along the skin crease along the lateral margin of the left labium majus, maximum SUV 7.4, with questionable cutaneous thickening/nodularity in this vicinity. Although possibly from a small amount of urinary incontinence, the possibility of a small cutaneous hypermetabolic lymph node is raised. Incidental CT findings: Aortoiliac atherosclerotic vascular disease. SKELETON: No significant abnormal hypermetabolic activity in this region. Incidental CT findings: Degenerative disc disease and degenerative endplate sclerosis at I4-3. IMPRESSION: 1. Cervical and lower uterine segment mass with maximum SUV 12.2, compatible with malignancy. 2. Although the 7 mm right common iliac node questioned on recent pelvic MRI has only low-grade activity similar to blood pool on today's examination, there is a small focus of cutaneous nodularity along the lateral margin of the left labium majus which could conceivably represent a small cutaneous metastatic lymph node, although urinary contamination can sometimes cause a similar appearance. Physical exam inspection of this region along the skin fold between the left labium majus and left upper thigh region with attention to any underlying nodularity is suggested. 3. 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 If this is not a known condition, cardiology referral for further workup may be warranted. 4.  Aortic Atherosclerosis (ICD10-I70.0). Electronically Signed   By: Van Clines M.D.   On:  02/21/2020 12:42   IR IMAGING GUIDED PORT INSERTION  Result Date: 02/13/2020 INDICATION: 55 year old female with history of cervical cancer requiring central venous access for chemotherapy. EXAM: IMPLANTED PORT A CATH PLACEMENT WITH ULTRASOUND AND FLUOROSCOPIC GUIDANCE COMPARISON:  None. MEDICATIONS: Ancef 2 gm IV; The antibiotic was administered within an appropriate time interval prior to skin puncture. ANESTHESIA/SEDATION: Moderate (conscious) sedation was employed during this procedure. A total of Versed 4 mg and Fentanyl 100 mcg was administered intravenously. Moderate Sedation Time: 17 minutes. The patient's level of consciousness and vital signs were monitored continuously by radiology nursing throughout the procedure under my direct supervision. CONTRAST:  None FLUOROSCOPY TIME:  0 minutes, 6 seconds (1 mGy) COMPLICATIONS: None immediate. PROCEDURE: The procedure, risks, benefits, and alternatives were explained to the patient. Questions regarding the procedure were encouraged and answered. The patient understands  and consents to the procedure. The right neck and chest were prepped with chlorhexidine in a sterile fashion, and a sterile drape was applied covering the operative field. Maximum barrier sterile technique with sterile gowns and gloves were used for the procedure. A timeout was performed prior to the initiation of the procedure. Ultrasound was used to examine the jugular vein which was compressible and free of internal echoes. A skin marker was used to demarcate the planned venotomy and port pocket incision sites. Local anesthesia was provided to these sites and the subcutaneous tunnel track with 1% lidocaine with 1:100,000 epinephrine. A small incision was created at the jugular access site and blunt dissection was performed of the subcutaneous tissues. Under real time ultrasound guidance, the jugular vein was accessed with a 21 ga micropuncture needle and an 0.018" wire was inserted to the  superior vena cava. A 5 Fr micopuncture set was then used, through which a 0.035" Rosen wire was passed under fluoroscopic guidance into the inferior vena cava. An 8 Fr dilator was then placed over the wire. A subcutaneous port pocket was then created along the upper chest wall utilizing a combination of sharp and blunt dissection. The pocket was irrigated with sterile saline, packed with gauze, and observed for hemorrhage. A single lumen "ISP" sized power injectable port was chosen for placement. The 8 Fr catheter was tunneled from the port pocket site to the venotomy incision. The port was placed in the pocket. The external catheter was trimmed to appropriate length. The dilator was exchanged for an 8 Fr peel-away sheath under fluoroscopic guidance. The catheter was then placed through the sheath and the sheath was removed. Final catheter positioning was confirmed and documented with a fluoroscopic spot radiograph. The port was accessed with a Huber needle, aspirated, and flushed with heparinized saline. The deep dermal layer of the port pocket incision was closed with interrupted 3-0 Vicryl suture. Dermabond was then placed over the port pocket and neck incisions. The patient tolerated the procedure well without immediate post procedural complication. FINDINGS: After catheter placement, the tip lies within the cavoatrial junction the catheter aspirates and flushes normally and is ready for immediate use. IMPRESSION: Successful placement of a power injectable Port-A-Cath via the right internal jugular vein. The catheter is ready for immediate use. Ruthann Cancer, MD [facility] Electronically Signed   By: Ruthann Cancer MD   On: 02/13/2020 16:49

## 2020-03-14 NOTE — Assessment & Plan Note (Signed)
She tolerated treatment better this week Overall, given her young age, I felt we should extend her concurrent chemo therapy with radiation from 5 doses to 6 doses I will get that appointment added to her schedule I will continue to see her on a weekly basis for supportive care and toxicity review

## 2020-03-14 NOTE — Assessment & Plan Note (Signed)
Her blood pressure is elevated She will continue increased dose amlodipine

## 2020-03-15 ENCOUNTER — Ambulatory Visit
Admission: RE | Admit: 2020-03-15 | Discharge: 2020-03-15 | Disposition: A | Discharge status: Home / Self Care | Payer: Medicaid Other | Source: Ambulatory Visit | Attending: Radiation Oncology | Admitting: Radiation Oncology

## 2020-03-15 ENCOUNTER — Other Ambulatory Visit: Payer: Self-pay

## 2020-03-15 ENCOUNTER — Inpatient Hospital Stay: Payer: Medicaid Other

## 2020-03-15 ENCOUNTER — Ambulatory Visit: Payer: Medicaid Other

## 2020-03-15 VITALS — BP 143/78 | HR 74 | Temp 99.0°F | Resp 18 | Wt 160.8 lb

## 2020-03-15 DIAGNOSIS — Z5111 Encounter for antineoplastic chemotherapy: Secondary | ICD-10-CM | POA: Diagnosis not present

## 2020-03-15 DIAGNOSIS — Z510 Encounter for antineoplastic radiation therapy: Secondary | ICD-10-CM | POA: Diagnosis not present

## 2020-03-15 DIAGNOSIS — C539 Malignant neoplasm of cervix uteri, unspecified: Secondary | ICD-10-CM

## 2020-03-15 MED ORDER — SODIUM CHLORIDE 0.9% FLUSH
10.0000 mL | INTRAVENOUS | Status: DC | PRN
  Administered 2020-03-15: 10 mL
  Filled 2020-03-15: qty 10

## 2020-03-15 MED ORDER — SODIUM CHLORIDE 0.9 % IV SOLN
Freq: Once | INTRAVENOUS | Status: AC
  Filled 2020-03-15: qty 250

## 2020-03-15 MED ORDER — MAGNESIUM SULFATE 2 GM/50ML IV SOLN
INTRAVENOUS | Status: AC
  Filled 2020-03-15: qty 50

## 2020-03-15 MED ORDER — MAGNESIUM SULFATE 2 GM/50ML IV SOLN
2.0000 g | Freq: Once | INTRAVENOUS | Status: AC
  Administered 2020-03-15: 2 g via INTRAVENOUS

## 2020-03-15 MED ORDER — SODIUM CHLORIDE 0.9 % IV SOLN
150.0000 mg | Freq: Once | INTRAVENOUS | Status: AC
  Administered 2020-03-15: 150 mg via INTRAVENOUS
  Filled 2020-03-15: qty 150

## 2020-03-15 MED ORDER — HEPARIN SOD (PORK) LOCK FLUSH 100 UNIT/ML IV SOLN
500.0000 [IU] | Freq: Once | INTRAVENOUS | Status: AC | PRN
  Administered 2020-03-15: 500 [IU]
  Filled 2020-03-15: qty 5

## 2020-03-15 MED ORDER — PALONOSETRON HCL INJECTION 0.25 MG/5ML
INTRAVENOUS | Status: AC
  Filled 2020-03-15: qty 5

## 2020-03-15 MED ORDER — PALONOSETRON HCL INJECTION 0.25 MG/5ML
0.2500 mg | Freq: Once | INTRAVENOUS | Status: AC
  Administered 2020-03-15: 0.25 mg via INTRAVENOUS

## 2020-03-15 MED ORDER — CISPLATIN CHEMO INJECTION 100MG/100ML
40.0000 mg/m2 | Freq: Once | INTRAVENOUS | Status: AC
  Administered 2020-03-15: 74 mg via INTRAVENOUS
  Filled 2020-03-15: qty 74

## 2020-03-15 MED ORDER — SODIUM CHLORIDE 0.9 % IV SOLN
10.0000 mg | Freq: Once | INTRAVENOUS | Status: AC
  Administered 2020-03-15: 10 mg via INTRAVENOUS
  Filled 2020-03-15: qty 10

## 2020-03-15 MED ORDER — POTASSIUM CHLORIDE IN NACL 20-0.9 MEQ/L-% IV SOLN
Freq: Once | INTRAVENOUS | Status: AC
  Filled 2020-03-15: qty 1000

## 2020-03-15 NOTE — Patient Instructions (Signed)
Cancer Center Discharge Instructions for Patients Receiving Chemotherapy  Today you received the following chemotherapy agents cisplatin  To help prevent nausea and vomiting after your treatment, we encourage you to take your nausea medication as directed   If you develop nausea and vomiting that is not controlled by your nausea medication, call the clinic.   BELOW ARE SYMPTOMS THAT SHOULD BE REPORTED IMMEDIATELY:  *FEVER GREATER THAN 100.5 F  *CHILLS WITH OR WITHOUT FEVER  NAUSEA AND VOMITING THAT IS NOT CONTROLLED WITH YOUR NAUSEA MEDICATION  *UNUSUAL SHORTNESS OF BREATH  *UNUSUAL BRUISING OR BLEEDING  TENDERNESS IN MOUTH AND THROAT WITH OR WITHOUT PRESENCE OF ULCERS  *URINARY PROBLEMS  *BOWEL PROBLEMS  UNUSUAL RASH Items with * indicate a potential emergency and should be followed up as soon as possible.  Feel free to call the clinic should you have any questions or concerns. The clinic phone number is [phone].  Please show the CHEMO ALERT CARD at check-in to the Emergency Department and triage nurse.   

## 2020-03-18 ENCOUNTER — Ambulatory Visit
Admission: RE | Admit: 2020-03-18 | Discharge: 2020-03-18 | Disposition: A | Discharge status: Home / Self Care | Payer: Medicaid Other | Source: Ambulatory Visit | Attending: Radiation Oncology | Admitting: Radiation Oncology

## 2020-03-18 ENCOUNTER — Other Ambulatory Visit: Payer: Self-pay

## 2020-03-18 ENCOUNTER — Ambulatory Visit: Payer: Medicaid Other

## 2020-03-18 DIAGNOSIS — Z510 Encounter for antineoplastic radiation therapy: Secondary | ICD-10-CM | POA: Diagnosis not present

## 2020-03-19 ENCOUNTER — Ambulatory Visit
Admission: RE | Admit: 2020-03-19 | Discharge: 2020-03-19 | Disposition: A | Discharge status: Home / Self Care | Payer: Medicaid Other | Source: Ambulatory Visit | Attending: Radiation Oncology | Admitting: Radiation Oncology

## 2020-03-19 ENCOUNTER — Ambulatory Visit: Payer: Medicaid Other

## 2020-03-19 DIAGNOSIS — Z510 Encounter for antineoplastic radiation therapy: Secondary | ICD-10-CM | POA: Diagnosis not present

## 2020-03-20 ENCOUNTER — Other Ambulatory Visit: Payer: Self-pay

## 2020-03-20 ENCOUNTER — Other Ambulatory Visit (HOSPITAL_COMMUNITY): Payer: Self-pay | Admitting: Radiation Oncology

## 2020-03-20 ENCOUNTER — Inpatient Hospital Stay: Payer: Medicaid Other

## 2020-03-20 ENCOUNTER — Ambulatory Visit: Payer: Medicaid Other

## 2020-03-20 ENCOUNTER — Other Ambulatory Visit: Payer: Self-pay | Admitting: Radiation Oncology

## 2020-03-20 ENCOUNTER — Ambulatory Visit
Admission: RE | Admit: 2020-03-20 | Discharge: 2020-03-20 | Disposition: A | Discharge status: Home / Self Care | Payer: Medicaid Other | Source: Ambulatory Visit | Attending: Radiation Oncology | Admitting: Radiation Oncology

## 2020-03-20 VITALS — BP 140/82 | HR 71 | Temp 98.6°F | Resp 18

## 2020-03-20 DIAGNOSIS — Z5111 Encounter for antineoplastic chemotherapy: Secondary | ICD-10-CM | POA: Diagnosis not present

## 2020-03-20 DIAGNOSIS — C539 Malignant neoplasm of cervix uteri, unspecified: Secondary | ICD-10-CM

## 2020-03-20 DIAGNOSIS — C530 Malignant neoplasm of endocervix: Secondary | ICD-10-CM

## 2020-03-20 DIAGNOSIS — Z510 Encounter for antineoplastic radiation therapy: Secondary | ICD-10-CM | POA: Diagnosis not present

## 2020-03-20 LAB — MAGNESIUM: Magnesium: 1.7 mg/dL (ref 1.7–2.4)

## 2020-03-20 LAB — BASIC METABOLIC PANEL - CANCER CENTER ONLY
Anion gap: 6 (ref 5–15)
BUN: 8 mg/dL (ref 6–20)
CO2: 24 mmol/L (ref 22–32)
Calcium: 8.8 mg/dL — ABNORMAL LOW (ref 8.9–10.3)
Chloride: 107 mmol/L (ref 98–111)
Creatinine: 0.57 mg/dL (ref 0.44–1.00)
GFR, Estimated: >60 mL/min (ref >60)
Glucose, Bld: 98 mg/dL (ref 70–99)
Potassium: 3.9 mmol/L (ref 3.5–5.1)
Sodium: 137 mmol/L (ref 135–145)

## 2020-03-20 LAB — CBC WITH DIFFERENTIAL (CANCER CENTER ONLY)
Abs Immature Granulocytes: 0.01 10*3/uL (ref 0.00–0.07)
Basophils Absolute: 0 10*3/uL (ref 0.0–0.1)
Basophils Relative: 1 %
Eosinophils Absolute: 0.2 10*3/uL (ref 0.0–0.5)
Eosinophils Relative: 8 %
HCT: 32.1 % — ABNORMAL LOW (ref 36.0–46.0)
Hemoglobin: 10.6 g/dL — ABNORMAL LOW (ref 12.0–15.0)
Immature Granulocytes: 0 %
Lymphocytes Relative: 17 %
Lymphs Abs: 0.5 10*3/uL — ABNORMAL LOW (ref 0.7–4.0)
MCH: 30.3 pg (ref 26.0–34.0)
MCHC: 33 g/dL (ref 30.0–36.0)
MCV: 91.7 fL (ref 80.0–100.0)
Monocytes Absolute: 0.5 10*3/uL (ref 0.1–1.0)
Monocytes Relative: 16 %
Neutro Abs: 1.8 10*3/uL (ref 1.7–7.7)
Neutrophils Relative %: 58 %
Platelet Count: 129 10*3/uL — ABNORMAL LOW (ref 150–400)
RBC: 3.5 MIL/uL — ABNORMAL LOW (ref 3.87–5.11)
RDW: 13.6 % (ref 11.5–15.5)
WBC Count: 3.1 10*3/uL — ABNORMAL LOW (ref 4.0–10.5)
nRBC: 0 % (ref 0.0–0.2)

## 2020-03-20 MED ORDER — SODIUM CHLORIDE 0.9% FLUSH
10.0000 mL | Freq: Once | INTRAVENOUS | Status: AC
  Administered 2020-03-20: 10 mL
  Filled 2020-03-20: qty 10

## 2020-03-20 MED ORDER — HEPARIN SOD (PORK) LOCK FLUSH 100 UNIT/ML IV SOLN
250.0000 [IU] | Freq: Once | INTRAVENOUS | Status: AC
  Administered 2020-03-20: 500 [IU]
  Filled 2020-03-20: qty 5

## 2020-03-20 NOTE — Patient Instructions (Signed)

## 2020-03-21 ENCOUNTER — Ambulatory Visit
Admission: RE | Admit: 2020-03-21 | Discharge: 2020-03-21 | Disposition: A | Discharge status: Home / Self Care | Payer: Medicaid Other | Source: Ambulatory Visit | Attending: Radiation Oncology | Admitting: Radiation Oncology

## 2020-03-21 ENCOUNTER — Encounter: Payer: Self-pay | Admitting: Hematology and Oncology

## 2020-03-21 ENCOUNTER — Ambulatory Visit: Payer: Medicaid Other

## 2020-03-21 ENCOUNTER — Inpatient Hospital Stay (HOSPITAL_BASED_OUTPATIENT_CLINIC_OR_DEPARTMENT_OTHER): Payer: Medicaid Other | Admitting: Hematology and Oncology

## 2020-03-21 ENCOUNTER — Encounter: Payer: Self-pay | Admitting: Oncology

## 2020-03-21 DIAGNOSIS — Z510 Encounter for antineoplastic radiation therapy: Secondary | ICD-10-CM | POA: Diagnosis not present

## 2020-03-21 DIAGNOSIS — C530 Malignant neoplasm of endocervix: Secondary | ICD-10-CM

## 2020-03-21 DIAGNOSIS — D61818 Other pancytopenia: Secondary | ICD-10-CM | POA: Insufficient documentation

## 2020-03-21 DIAGNOSIS — Z5111 Encounter for antineoplastic chemotherapy: Secondary | ICD-10-CM | POA: Diagnosis not present

## 2020-03-21 DIAGNOSIS — I10 Essential (primary) hypertension: Secondary | ICD-10-CM | POA: Diagnosis not present

## 2020-03-21 DIAGNOSIS — G893 Neoplasm related pain (acute) (chronic): Secondary | ICD-10-CM | POA: Diagnosis not present

## 2020-03-21 MED ORDER — OXYCODONE HCL 5 MG PO TABS: 5.0000 mg | ORAL_TABLET | Freq: Four times a day (QID) | ORAL | 0 refills | Status: DC | PRN

## 2020-03-21 NOTE — Assessment & Plan Note (Signed)
Her pelvic pain is well controlled with current prescribed oxycodone She will continue the same

## 2020-03-21 NOTE — Assessment & Plan Note (Signed)
She has slight worsening pancytopenia She is not symptomatic Observe for now

## 2020-03-21 NOTE — Progress Notes (Signed)
Met with Madeline Howard and discussed what to expect with her upcoming tandem and ring treatments.  Advised her to call if she has any questions.

## 2020-03-21 NOTE — Assessment & Plan Note (Signed)
Overall, she tolerated treatment fairly well except for mild progressive pancytopenia We will proceed with treatment as scheduled tomorrow We will reassess her blood count again next week If she has worsening pancytopenia, we will discontinue chemotherapy next week

## 2020-03-21 NOTE — Assessment & Plan Note (Signed)
Her blood pressure is elevated but she has not been checking it at home I recommend she continue his current dose of amlodipine

## 2020-03-21 NOTE — Progress Notes (Signed)
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[place]) Overall, she tolerated treatment fairly well except for mild progressive pancytopenia We will proceed with treatment as scheduled tomorrow We will reassess her blood count again next week If she has worsening pancytopenia, we will discontinue chemotherapy next week  Pancytopenia, acquired ([place]) She has slight worsening pancytopenia She is not symptomatic Observe for now  Cancer associated pain Her pelvic pain is well controlled with current prescribed oxycodone She will continue the same  Essential hypertension Her blood pressure is elevated but she has not been checking it at home I recommend she continue his current dose of amlodipine   No orders of the defined types were placed in this encounter.   All questions were answered. The patient knows to call the clinic with any problems, questions or concerns. The total time spent in the appointment was 20 minutes encounter with patients including review of chart and various tests results, discussions about plan of care and coordination of care plan   Heath Lark, MD 03/21/2020 2:39 PM  INTERVAL HISTORY: Please see below for problem oriented charting. She returns for further follow-up She is doing well except for some mild intermittent diarrhea and intermittent bleeding Her pain is better controlled She takes oxycodone 2 or 3 times a day No recent nausea No peripheral neuropathy  SUMMARY OF ONCOLOGIC HISTORY: Oncology History  Malignant neoplasm of cervix ([place])  12/11/2019 Initial Diagnosis   She presented with postmenopausal bleeding   01/12/2020 Initial Diagnosis   Malignant neoplasm of cervix ([place])   01/12/2020 Pathology Results   FINAL MICROSCOPIC DIAGNOSIS:   A. CERVIX, 3:00,  5:00, BIOPSY:  -  Invasive squamous cell carcinoma arising in a background of high-grade dysplasia  -  See comment   B.  ENDOCERVIX, CURETTAGE:  -  High grade squamous intraepithelial lesion (CIN 3; severe dysplasia/carcinoma in situ)    01/17/2020 Pathology Results   FINAL MICROSCOPIC DIAGNOSIS:   A. VAGINOCERVICAL JUNCTION, 6:00, BIOPSY:  - High grade squamous intraepithelial lesion, CIN-II/VAIN-II (moderate dysplasia).   B. VAGINOCERVICAL JUNCTION, 4:00, BIOPSY:  - High grade squamous intraepithelial lesion, CIN-III/VAIN-III (severe dysplasia/CIS).   C. VAGINOCERVICAL JUNCTION, 12:00, BIOPSY:  - Invasive squamous cell carcinoma, see comment.   D. ENDOMETRIUM, CURETTAGE:  - Predominately blood with fragments of high grade squamous intraepithelial lesion.   E. ENDOCERVIX, CURETTAGE:  - Invasive squamous cell carcinoma, see comment   01/17/2020 Surgery   Postoperative Diagnosis: Clinical stage IIB disease due to parametria thickening   Surgery: EUA, multiple cervical biopsies, ECC, EMB    Surgeons:  Jeral Pinch MD  Pathology: Biopsies at 4 and 6  o'c at the posterior cervicovaginal junction, biopsy from anterior cervix at 50 o'c, ECC, EMB   Operative findings: Cervix barrel shaped and firm, visible lesion that is friable spanning most of the posterior cervix (approximately 2 x 3.5cm). Cervix is flush with the vagina posteriorly; lesion abuts the cervico-vaginal junction. Very gritty endocervical canal noted on ECC. EMB revealed somewhat purulent appearing blood. On rectovaginal exam, concerning for thickening posteriorly in the midline and to the left, concerning for parametria involvement.   01/26/2020 Cancer Staging   Staging form: Cervix Uteri, AJCC Version 9 - Clinical stage from 01/26/2020: FIGO Stage IIB (cT2b, cN0, cM0) - Signed by Heath Lark, MD on 01/29/2020  01/26/2020 Imaging   1. Known cervical malignancy is not well appreciated. There is no evidence of  lymphadenopathy or metastatic disease in the abdomen or pelvis. 2. Probable fibroid of the anterior uterine body, not well delineated by CT.   Aortic Atherosclerosis (ICD10-I70.0).     02/13/2020 Procedure   Successful placement of a power injectable Port-A-Cath via the right internal jugular vein. The catheter is ready for immediate use.   02/21/2020 PET scan   1. Cervical and lower uterine segment mass with maximum SUV 12.2, compatible with malignancy. 2. Although the 7 mm right common iliac node questioned on recent pelvic MRI has only low-grade activity similar to blood pool on today's examination, there is a small focus of cutaneous nodularity along the lateral margin of the left labium majus which could conceivably represent a small cutaneous metastatic lymph node, although urinary contamination can sometimes cause a similar appearance. Physical exam inspection of this region along the skin fold between the left labium majus and left upper thigh region with attention to any underlying nodularity is suggested. 3. 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 If this is not a known condition, cardiology referral for further workup may be warranted. 4.  Aortic Atherosclerosis (ICD10-I70.0).   02/23/2020 -  Chemotherapy    Patient is on Treatment Plan: CERVICAL CISPLATIN Q7D        REVIEW OF SYSTEMS:   Constitutional: Denies fevers, chills or abnormal weight loss Eyes: Denies blurriness of vision Ears, nose, mouth, throat, and face: Denies mucositis or sore throat Respiratory: Denies cough, dyspnea or wheezes Cardiovascular: Denies palpitation, chest discomfort or lower extremity swelling Gastrointestinal:  Denies nausea, heartburn or change in bowel habits Skin: Denies abnormal skin rashes Lymphatics: Denies new lymphadenopathy or easy bruising Neurological:Denies numbness, tingling or new weaknesses Behavioral/Psych: Mood is stable, no new changes  All  other systems were reviewed with the patient and are negative.  I have reviewed the past medical history, past surgical history, social history and family history with the patient and they are unchanged from previous note.  ALLERGIES:  has No Known Allergies.  MEDICATIONS:  Current Outpatient Medications  Medication Sig Dispense Refill  . acetaminophen (TYLENOL) 650 MG CR tablet Take 1,300 mg by mouth every 8 (eight) hours as needed for pain.    Marland Kitchen amLODipine (NORVASC) 10 MG tablet Take 1 tablet (10 mg total) by mouth daily. 30 tablet 1  . Chlorpheniramine Maleate (ALLERGY PO) Take 1 tablet by mouth daily.     . famotidine (PEPCID) 20 MG tablet Take 20 mg by mouth daily. OTC    . lidocaine-prilocaine (EMLA) cream Apply to affected area once 30 g 3  . magnesium oxide (MAG-OX) 400 (241.3 Mg) MG tablet Take 1 tablet (400 mg total) by mouth daily. 30 tablet 1  . Multiple Vitamin (MULTIVITAMIN) capsule Take 1 capsule by mouth daily.    . ondansetron (ZOFRAN) 8 MG tablet Take 1 tablet (8 mg total) by mouth every 8 (eight) hours as needed. Start on the third day after cisplatin chemotherapy. 30 tablet 1  . oxyCODONE (OXY IR/ROXICODONE) 5 MG immediate release tablet Take 1 tablet (5 mg total) by mouth every 6 (six) hours as needed for severe pain. 30 tablet 0  . prochlorperazine (COMPAZINE) 10 MG tablet Take 1 tablet (10 mg total) by mouth every 6 (six) hours as needed (Nausea or vomiting). 30 tablet 1   No current facility-administered medications for this visit.    PHYSICAL  EXAMINATION: ECOG PERFORMANCE STATUS: 1 - Symptomatic but completely ambulatory  Vitals:   03/21/20 1406  BP: (!) 151/74  Pulse: 77  Resp: 18  Temp: 98.6 F (37 C)  SpO2: 100%   Filed Weights   03/21/20 1406  Weight: 161 lb (73 kg)    GENERAL:alert, no distress and comfortable SKIN: skin color, texture, turgor are normal, no rashes or significant lesions EYES: normal, Conjunctiva are pink and non-injected,  sclera clear OROPHARYNX:no exudate, no erythema and lips, buccal mucosa, and tongue normal  NECK: supple, thyroid normal size, non-tender, without nodularity LYMPH:  no palpable lymphadenopathy in the cervical, axillary or inguinal LUNGS: clear to auscultation and percussion with normal breathing effort HEART: regular rate & rhythm and no murmurs and no lower extremity edema ABDOMEN:abdomen soft, non-tender and normal bowel sounds Musculoskeletal:no cyanosis of digits and no clubbing  NEURO: alert & oriented x 3 with fluent speech, no focal motor/sensory deficits  LABORATORY DATA:  I have reviewed the data as listed    Component Value Date/Time   NA 137 03/20/2020 1313   K 3.9 03/20/2020 1313   CL 107 03/20/2020 1313   CO2 24 03/20/2020 1313   GLUCOSE 98 03/20/2020 1313   BUN 8 03/20/2020 1313   CREATININE 0.57 03/20/2020 1313   CALCIUM 8.8 (L) 03/20/2020 1313   PROT 8.9 (H) 02/13/2020 1222   ALBUMIN 4.1 02/13/2020 1222   AST 41 02/13/2020 1222   AST 47 (H) 01/12/2020 1456   ALT 24 02/13/2020 1222   ALT 36 01/12/2020 1456   ALKPHOS 60 02/13/2020 1222   BILITOT 0.3 02/13/2020 1222   BILITOT 0.4 01/12/2020 1456   GFRNONAA >60 03/20/2020 1313    No results found for: SPEP, UPEP  Lab Results  Component Value Date   WBC 3.1 (L) 03/20/2020   NEUTROABS 1.8 03/20/2020   HGB 10.6 (L) 03/20/2020   HCT 32.1 (L) 03/20/2020   MCV 91.7 03/20/2020   PLT 129 (L) 03/20/2020      Chemistry      Component Value Date/Time   NA 137 03/20/2020 1313   K 3.9 03/20/2020 1313   CL 107 03/20/2020 1313   CO2 24 03/20/2020 1313   BUN 8 03/20/2020 1313   CREATININE 0.57 03/20/2020 1313      Component Value Date/Time   CALCIUM 8.8 (L) 03/20/2020 1313   ALKPHOS 60 02/13/2020 1222   AST 41 02/13/2020 1222   AST 47 (H) 01/12/2020 1456   ALT 24 02/13/2020 1222   ALT 36 01/12/2020 1456   BILITOT 0.3 02/13/2020 1222   BILITOT 0.4 01/12/2020 1456       RADIOGRAPHIC STUDIES: I have  personally reviewed the radiological images as listed and agreed with the findings in the report. NM PET Image Initial (PI) Skull Base To Thigh  Result Date: 02/21/2020 CLINICAL DATA:  Initial treatment strategy for squamous cell carcinoma of the cervix with vaginal involvement. EXAM: NUCLEAR MEDICINE PET SKULL BASE TO THIGH TECHNIQUE: 7.1 mCi F-18 FDG was injected intravenously. Full-ring PET imaging was performed from the skull base to thigh after the radiotracer. CT data was obtained and used for attenuation correction and anatomic localization. Fasting blood glucose: 89 mg/dl COMPARISON:  Multiple exams, including 01/26/2020 and MRI pelvis from 02/12/2020 FINDINGS: Mediastinal blood pool activity: SUV max 2.4 Liver activity: SUV max NA NECK: Symmetric activity along the palatine tonsils, maximum SUV 6.9 on the left and 5.9 on the right, likely physiologic. Incidental CT findings: Small internal jugular lymph  nodes are not pathologically enlarged and not substantially hypermetabolic, and likely incidental. CHEST: Mildly accentuated activity along the course of the Port-A-Cath is likely incidental inflammatory activity. No hypermetabolic lesion in the chest to suggest thoracic malignancy. Incidental CT findings: The main pulmonary artery measures up to 4.9 cm in diameter compatible with pulmonary arterial aneurysm. Minimal atherosclerotic calcification of the descending thoracic aorta. Upper normal heart size. ABDOMEN/PELVIS: Hypermetabolic cervical annual lower uterine segment mass, maximum SUV 12.2 the right common iliac node noted on prior MRI currently measures 0.7 cm in short axis on image 148 of series 4 with maximum SUV of 2.3 which is similar to the blood pool. Overall no overtly hypermetabolic adenopathy is identified in the pelvis. Linear activity along the right posterior pelvis is likely in the right distal ureter. Subtle activity along the right adnexa with maximum SUV 4.1, nonspecific but not  necessarily indicative of malignancy. Physiologic activity in bowel noted. Activity along the skin crease along the lateral margin of the left labium majus, maximum SUV 7.4, with questionable cutaneous thickening/nodularity in this vicinity. Although possibly from a small amount of urinary incontinence, the possibility of a small cutaneous hypermetabolic lymph node is raised. Incidental CT findings: Aortoiliac atherosclerotic vascular disease. SKELETON: No significant abnormal hypermetabolic activity in this region. Incidental CT findings: Degenerative disc disease and degenerative endplate sclerosis at T1-5. IMPRESSION: 1. Cervical and lower uterine segment mass with maximum SUV 12.2, compatible with malignancy. 2. Although the 7 mm right common iliac node questioned on recent pelvic MRI has only low-grade activity similar to blood pool on today's examination, there is a small focus of cutaneous nodularity along the lateral margin of the left labium majus which could conceivably represent a small cutaneous metastatic lymph node, although urinary contamination can sometimes cause a similar appearance. Physical exam inspection of this region along the skin fold between the left labium majus and left upper thigh region with attention to any underlying nodularity is suggested. 3. 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 If this is not a known condition, cardiology referral for further workup may be warranted. 4.  Aortic Atherosclerosis (ICD10-I70.0). Electronically Signed   By: Van Clines M.D.   On: 02/21/2020 12:42

## 2020-03-22 ENCOUNTER — Inpatient Hospital Stay: Payer: Medicaid Other

## 2020-03-22 ENCOUNTER — Ambulatory Visit: Payer: Medicaid Other

## 2020-03-22 ENCOUNTER — Ambulatory Visit
Admission: RE | Admit: 2020-03-22 | Discharge: 2020-03-22 | Disposition: A | Discharge status: Home / Self Care | Payer: Medicaid Other | Source: Ambulatory Visit | Attending: Radiation Oncology | Admitting: Radiation Oncology

## 2020-03-22 ENCOUNTER — Other Ambulatory Visit: Payer: Self-pay

## 2020-03-22 VITALS — BP 146/72 | HR 78 | Resp 18

## 2020-03-22 DIAGNOSIS — Z510 Encounter for antineoplastic radiation therapy: Secondary | ICD-10-CM | POA: Diagnosis not present

## 2020-03-22 DIAGNOSIS — C539 Malignant neoplasm of cervix uteri, unspecified: Secondary | ICD-10-CM

## 2020-03-22 DIAGNOSIS — Z5111 Encounter for antineoplastic chemotherapy: Secondary | ICD-10-CM | POA: Diagnosis not present

## 2020-03-22 MED ORDER — HEPARIN SOD (PORK) LOCK FLUSH 100 UNIT/ML IV SOLN
500.0000 [IU] | Freq: Once | INTRAVENOUS | Status: AC | PRN
  Administered 2020-03-22: 500 [IU]
  Filled 2020-03-22: qty 5

## 2020-03-22 MED ORDER — SODIUM CHLORIDE 0.9 % IV SOLN
Freq: Once | INTRAVENOUS | Status: AC
  Filled 2020-03-22: qty 250

## 2020-03-22 MED ORDER — PALONOSETRON HCL INJECTION 0.25 MG/5ML
INTRAVENOUS | Status: AC
  Filled 2020-03-22: qty 5

## 2020-03-22 MED ORDER — MAGNESIUM SULFATE 2 GM/50ML IV SOLN
2.0000 g | Freq: Once | INTRAVENOUS | Status: AC
  Administered 2020-03-22: 2 g via INTRAVENOUS

## 2020-03-22 MED ORDER — FOSAPREPITANT DIMEGLUMINE INJECTION 150 MG
150.0000 mg | Freq: Once | INTRAVENOUS | Status: AC
  Administered 2020-03-22: 150 mg via INTRAVENOUS
  Filled 2020-03-22: qty 150

## 2020-03-22 MED ORDER — SODIUM CHLORIDE 0.9 % IV SOLN
40.0000 mg/m2 | Freq: Once | INTRAVENOUS | Status: AC
  Administered 2020-03-22: 74 mg via INTRAVENOUS
  Filled 2020-03-22: qty 74

## 2020-03-22 MED ORDER — SODIUM CHLORIDE 0.9 % IV SOLN
10.0000 mg | Freq: Once | INTRAVENOUS | Status: AC
  Administered 2020-03-22: 10 mg via INTRAVENOUS
  Filled 2020-03-22: qty 10

## 2020-03-22 MED ORDER — SODIUM CHLORIDE 0.9% FLUSH
10.0000 mL | INTRAVENOUS | Status: DC | PRN
  Administered 2020-03-22: 10 mL
  Filled 2020-03-22: qty 10

## 2020-03-22 MED ORDER — MAGNESIUM SULFATE 2 GM/50ML IV SOLN
INTRAVENOUS | Status: AC
  Filled 2020-03-22: qty 50

## 2020-03-22 MED ORDER — POTASSIUM CHLORIDE IN NACL 20-0.9 MEQ/L-% IV SOLN
Freq: Once | INTRAVENOUS | Status: AC
  Filled 2020-03-22: qty 1000

## 2020-03-22 MED ORDER — PALONOSETRON HCL INJECTION 0.25 MG/5ML
0.2500 mg | Freq: Once | INTRAVENOUS | Status: AC
  Administered 2020-03-22: 0.25 mg via INTRAVENOUS

## 2020-03-22 NOTE — Patient Instructions (Signed)
[facility] Discharge Instructions for Patients Receiving Chemotherapy  Today you received the following chemotherapy agents: Cisplatin  To help prevent nausea and vomiting after your treatment, we encourage you to take your nausea medication  as prescribed.    If you develop nausea and vomiting that is not controlled by your nausea medication, call the clinic.   BELOW ARE SYMPTOMS THAT SHOULD BE REPORTED IMMEDIATELY:  *FEVER GREATER THAN 100.5 F  *CHILLS WITH OR WITHOUT FEVER  NAUSEA AND VOMITING THAT IS NOT CONTROLLED WITH YOUR NAUSEA MEDICATION  *UNUSUAL SHORTNESS OF BREATH  *UNUSUAL BRUISING OR BLEEDING  TENDERNESS IN MOUTH AND THROAT WITH OR WITHOUT PRESENCE OF ULCERS  *URINARY PROBLEMS  *BOWEL PROBLEMS  UNUSUAL RASH Items with * indicate a potential emergency and should be followed up as soon as possible.  Feel free to call the clinic should you have any questions or concerns. The clinic phone number is [phone].  Please show the CHEMO ALERT CARD at check-in to the Emergency Department and triage nurse.   

## 2020-03-25 ENCOUNTER — Ambulatory Visit
Admission: RE | Admit: 2020-03-25 | Discharge: 2020-03-25 | Disposition: A | Discharge status: Home / Self Care | Payer: Medicaid Other | Source: Ambulatory Visit | Attending: Radiation Oncology | Admitting: Radiation Oncology

## 2020-03-25 ENCOUNTER — Ambulatory Visit: Payer: Medicaid Other

## 2020-03-25 ENCOUNTER — Other Ambulatory Visit: Payer: Self-pay

## 2020-03-25 DIAGNOSIS — Z510 Encounter for antineoplastic radiation therapy: Secondary | ICD-10-CM | POA: Diagnosis not present

## 2020-03-26 ENCOUNTER — Ambulatory Visit
Admission: RE | Admit: 2020-03-26 | Discharge: 2020-03-26 | Disposition: A | Discharge status: Home / Self Care | Payer: Medicaid Other | Source: Ambulatory Visit | Attending: Radiation Oncology | Admitting: Radiation Oncology

## 2020-03-26 ENCOUNTER — Other Ambulatory Visit: Payer: Self-pay

## 2020-03-26 ENCOUNTER — Ambulatory Visit: Payer: Medicaid Other

## 2020-03-26 DIAGNOSIS — Z510 Encounter for antineoplastic radiation therapy: Secondary | ICD-10-CM | POA: Diagnosis not present

## 2020-03-27 ENCOUNTER — Inpatient Hospital Stay: Payer: Medicaid Other

## 2020-03-27 ENCOUNTER — Ambulatory Visit: Payer: Medicaid Other

## 2020-03-27 ENCOUNTER — Other Ambulatory Visit: Payer: Self-pay

## 2020-03-27 ENCOUNTER — Ambulatory Visit
Admission: RE | Admit: 2020-03-27 | Discharge: 2020-03-27 | Disposition: A | Discharge status: Home / Self Care | Payer: Medicaid Other | Source: Ambulatory Visit | Attending: Radiation Oncology | Admitting: Radiation Oncology

## 2020-03-27 DIAGNOSIS — C539 Malignant neoplasm of cervix uteri, unspecified: Secondary | ICD-10-CM

## 2020-03-27 DIAGNOSIS — Z510 Encounter for antineoplastic radiation therapy: Secondary | ICD-10-CM | POA: Diagnosis not present

## 2020-03-27 DIAGNOSIS — Z5111 Encounter for antineoplastic chemotherapy: Secondary | ICD-10-CM | POA: Diagnosis not present

## 2020-03-27 LAB — CBC WITH DIFFERENTIAL (CANCER CENTER ONLY)
Abs Immature Granulocytes: 0 10*3/uL (ref 0.00–0.07)
Basophils Absolute: 0 10*3/uL (ref 0.0–0.1)
Basophils Relative: 1 %
Eosinophils Absolute: 0.2 10*3/uL (ref 0.0–0.5)
Eosinophils Relative: 9 %
HCT: 29.7 % — ABNORMAL LOW (ref 36.0–46.0)
Hemoglobin: 9.9 g/dL — ABNORMAL LOW (ref 12.0–15.0)
Immature Granulocytes: 0 %
Lymphocytes Relative: 19 %
Lymphs Abs: 0.5 10*3/uL — ABNORMAL LOW (ref 0.7–4.0)
MCH: 30.8 pg (ref 26.0–34.0)
MCHC: 33.3 g/dL (ref 30.0–36.0)
MCV: 92.5 fL (ref 80.0–100.0)
Monocytes Absolute: 0.4 10*3/uL (ref 0.1–1.0)
Monocytes Relative: 14 %
Neutro Abs: 1.6 10*3/uL — ABNORMAL LOW (ref 1.7–7.7)
Neutrophils Relative %: 57 %
Platelet Count: 127 10*3/uL — ABNORMAL LOW (ref 150–400)
RBC: 3.21 MIL/uL — ABNORMAL LOW (ref 3.87–5.11)
RDW: 14.6 % (ref 11.5–15.5)
WBC Count: 2.8 10*3/uL — ABNORMAL LOW (ref 4.0–10.5)
nRBC: 0 % (ref 0.0–0.2)

## 2020-03-27 LAB — BASIC METABOLIC PANEL - CANCER CENTER ONLY
Anion gap: 7 (ref 5–15)
BUN: 6 mg/dL (ref 6–20)
CO2: 23 mmol/L (ref 22–32)
Calcium: 8.5 mg/dL — ABNORMAL LOW (ref 8.9–10.3)
Chloride: 106 mmol/L (ref 98–111)
Creatinine: 0.63 mg/dL (ref 0.44–1.00)
GFR, Estimated: >60 mL/min (ref >60)
Glucose, Bld: 121 mg/dL — ABNORMAL HIGH (ref 70–99)
Potassium: 3.5 mmol/L (ref 3.5–5.1)
Sodium: 136 mmol/L (ref 135–145)

## 2020-03-27 LAB — MAGNESIUM: Magnesium: 1.7 mg/dL (ref 1.7–2.4)

## 2020-03-27 MED ORDER — HEPARIN SOD (PORK) LOCK FLUSH 100 UNIT/ML IV SOLN
500.0000 [IU] | Freq: Once | INTRAVENOUS | Status: AC
  Administered 2020-03-27: 500 [IU]
  Filled 2020-03-27: qty 5

## 2020-03-27 MED ORDER — SODIUM CHLORIDE 0.9% FLUSH
10.0000 mL | Freq: Once | INTRAVENOUS | Status: AC
  Administered 2020-03-27: 10 mL
  Filled 2020-03-27: qty 10

## 2020-03-27 NOTE — Patient Instructions (Signed)

## 2020-03-28 ENCOUNTER — Other Ambulatory Visit: Payer: Self-pay

## 2020-03-28 ENCOUNTER — Inpatient Hospital Stay (HOSPITAL_BASED_OUTPATIENT_CLINIC_OR_DEPARTMENT_OTHER): Payer: Medicaid Other | Admitting: Hematology and Oncology

## 2020-03-28 ENCOUNTER — Encounter: Payer: Self-pay | Admitting: Hematology and Oncology

## 2020-03-28 ENCOUNTER — Ambulatory Visit
Admission: RE | Admit: 2020-03-28 | Discharge: 2020-03-28 | Disposition: A | Discharge status: Home / Self Care | Payer: Medicaid Other | Source: Ambulatory Visit | Attending: Radiation Oncology | Admitting: Radiation Oncology

## 2020-03-28 ENCOUNTER — Ambulatory Visit: Payer: Medicaid Other

## 2020-03-28 DIAGNOSIS — Z510 Encounter for antineoplastic radiation therapy: Secondary | ICD-10-CM | POA: Diagnosis not present

## 2020-03-28 DIAGNOSIS — C530 Malignant neoplasm of endocervix: Secondary | ICD-10-CM | POA: Diagnosis not present

## 2020-03-28 DIAGNOSIS — D61818 Other pancytopenia: Secondary | ICD-10-CM | POA: Diagnosis not present

## 2020-03-28 DIAGNOSIS — I10 Essential (primary) hypertension: Secondary | ICD-10-CM

## 2020-03-28 DIAGNOSIS — G893 Neoplasm related pain (acute) (chronic): Secondary | ICD-10-CM

## 2020-03-28 DIAGNOSIS — L089 Local infection of the skin and subcutaneous tissue, unspecified: Secondary | ICD-10-CM | POA: Diagnosis not present

## 2020-03-28 DIAGNOSIS — Z5111 Encounter for antineoplastic chemotherapy: Secondary | ICD-10-CM | POA: Diagnosis not present

## 2020-03-28 MED ORDER — CEPHALEXIN 500 MG PO CAPS: 500.0000 mg | ORAL_CAPSULE | Freq: Three times a day (TID) | ORAL | 0 refills | Status: DC

## 2020-03-28 MED ORDER — OXYCODONE HCL 5 MG PO TABS: 5.0000 mg | ORAL_TABLET | Freq: Four times a day (QID) | ORAL | 0 refills | Status: DC | PRN

## 2020-03-28 NOTE — Assessment & Plan Note (Signed)
This is related to side effects of treatment She is complaining of fatigue but otherwise did not feel bad I plan to reduce the dose of chemotherapy tomorrow

## 2020-03-28 NOTE — Assessment & Plan Note (Signed)
Her blood pressure is elevated However, it could be due to pain She will continue close monitoring of blood pressure and we will continue amlodipine for now

## 2020-03-28 NOTE — Assessment & Plan Note (Signed)
Her pelvic pain is well controlled with current prescribed oxycodone She will continue the same I refill her prescription today

## 2020-03-28 NOTE — Assessment & Plan Note (Signed)
She is developing progressive pancytopenia from treatment However, she is relatively asymptomatic We discussed the risk and benefits of pursuing final treatment tomorrow Ultimately, we agreed to proceed with treatment as scheduled but with dose reduction

## 2020-03-28 NOTE — Assessment & Plan Note (Signed)
She has minor skin infection in her vulvar region She does not need to hold treatment I plan to proceed with treatment as scheduled I will prescribe a short course of antibiotics for her to take and reassess next week She is in agreement to proceed.

## 2020-03-28 NOTE — Progress Notes (Signed)
[address] OFFICE PROGRESS NOTE  Patient Care Team: Patient, No Pcp Per as PCP - General (General Practice) Awanda Mink, Craige Cotta, RN as Oncology Nurse Navigator (Oncology)  ASSESSMENT & PLAN:  Malignant neoplasm of cervix [facility]) She is developing progressive pancytopenia from treatment However, she is relatively asymptomatic We discussed the risk and benefits of pursuing final treatment tomorrow Ultimately, we agreed to proceed with treatment as scheduled but with dose reduction  Pancytopenia, acquired ([place]) This is related to side effects of treatment She is complaining of fatigue but otherwise did not feel bad I plan to reduce the dose of chemotherapy tomorrow  Cancer associated pain Her pelvic pain is well controlled with current prescribed oxycodone She will continue the same I refill her prescription today  Essential hypertension Her blood pressure is elevated However, it could be due to pain She will continue close monitoring of blood pressure and we will continue amlodipine for now  Local skin infection She has minor skin infection in her vulvar region She does not need to hold treatment I plan to proceed with treatment as scheduled I will prescribe a short course of antibiotics for her to take and reassess next week She is in agreement to proceed.   No orders of the defined types were placed in this encounter.   All questions were answered. The patient knows to call the clinic with any problems, questions or concerns. The total time spent in the appointment was 30 minutes encounter with patients including review of chart and various tests results, discussions about plan of care and coordination of care plan   Heath Lark, MD 03/28/2020 2:10 PM  INTERVAL HISTORY: Please see below for problem oriented charting. She returns for further follow-up She has minor vaginal spotting Has a small area of skin infection in her vulvar region She denies nausea or  changes in bowel habits No peripheral neuropathy She continues to have pelvic pain and she takes on average 3 pain medicine per day  SUMMARY OF ONCOLOGIC HISTORY: Oncology History  Malignant neoplasm of cervix ([place])  12/11/2019 Initial Diagnosis   She presented with postmenopausal bleeding   01/12/2020 Initial Diagnosis   Malignant neoplasm of cervix ([place])   01/12/2020 Pathology Results   FINAL MICROSCOPIC DIAGNOSIS:   A. CERVIX, 3:00, 5:00, BIOPSY:  -  Invasive squamous cell carcinoma arising in a background of high-grade dysplasia  -  See comment   B.  ENDOCERVIX, CURETTAGE:  -  High grade squamous intraepithelial lesion (CIN 3; severe dysplasia/carcinoma in situ)    01/17/2020 Pathology Results   FINAL MICROSCOPIC DIAGNOSIS:   A. VAGINOCERVICAL JUNCTION, 6:00, BIOPSY:  - High grade squamous intraepithelial lesion, CIN-II/VAIN-II (moderate dysplasia).   B. VAGINOCERVICAL JUNCTION, 4:00, BIOPSY:  - High grade squamous intraepithelial lesion, CIN-III/VAIN-III (severe dysplasia/CIS).   C. VAGINOCERVICAL JUNCTION, 12:00, BIOPSY:  - Invasive squamous cell carcinoma, see comment.   D. ENDOMETRIUM, CURETTAGE:  - Predominately blood with fragments of high grade squamous intraepithelial lesion.   E. ENDOCERVIX, CURETTAGE:  - Invasive squamous cell carcinoma, see comment   01/17/2020 Surgery   Postoperative Diagnosis: Clinical stage IIB disease due to parametria thickening   Surgery: EUA, multiple cervical biopsies, ECC, EMB    Surgeons:  Jeral Pinch MD  Pathology: Biopsies at 4 and 6  o'c at the posterior cervicovaginal junction, biopsy from anterior cervix at 49 o'c, ECC, EMB   Operative findings: Cervix barrel shaped and firm, visible lesion that is friable spanning most of the posterior cervix (  approximately 2 x 3.5cm). Cervix is flush with the vagina posteriorly; lesion abuts the cervico-vaginal junction. Very gritty endocervical canal noted on ECC. EMB revealed  somewhat purulent appearing blood. On rectovaginal exam, concerning for thickening posteriorly in the midline and to the left, concerning for parametria involvement.   01/26/2020 Cancer Staging   Staging form: Cervix Uteri, AJCC Version 9 - Clinical stage from 01/26/2020: FIGO Stage IIB (cT2b, cN0, cM0) - Signed by Heath Lark, MD on 01/29/2020   01/26/2020 Imaging   1. Known cervical malignancy is not well appreciated. There is no evidence of lymphadenopathy or metastatic disease in the abdomen or pelvis. 2. Probable fibroid of the anterior uterine body, not well delineated by CT.   Aortic Atherosclerosis (ICD10-I70.0).     02/13/2020 Procedure   Successful placement of a power injectable Port-A-Cath via the right internal jugular vein. The catheter is ready for immediate use.   02/21/2020 PET scan   1. Cervical and lower uterine segment mass with maximum SUV 12.2, compatible with malignancy. 2. Although the 7 mm right common iliac node questioned on recent pelvic MRI has only low-grade activity similar to blood pool on today's examination, there is a small focus of cutaneous nodularity along the lateral margin of the left labium majus which could conceivably represent a small cutaneous metastatic lymph node, although urinary contamination can sometimes cause a similar appearance. Physical exam inspection of this region along the skin fold between the left labium majus and left upper thigh region with attention to any underlying nodularity is suggested. 3. 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 If this is not a known condition, cardiology referral for further workup may be warranted. 4.  Aortic Atherosclerosis (ICD10-I70.0).   02/23/2020 -  Chemotherapy    Patient is on Treatment Plan: CERVICAL CISPLATIN Q7D        REVIEW OF SYSTEMS:   Constitutional: Denies fevers, chills or abnormal weight loss Eyes: Denies blurriness of vision Ears, nose, mouth,  throat, and face: Denies mucositis or sore throat Respiratory: Denies cough, dyspnea or wheezes Cardiovascular: Denies palpitation, chest discomfort or lower extremity swelling Gastrointestinal:  Denies nausea, heartburn or change in bowel habits Skin: Denies abnormal skin rashes Lymphatics: Denies new lymphadenopathy or easy bruising Neurological:Denies numbness, tingling or new weaknesses Behavioral/Psych: Mood is stable, no new changes  All other systems were reviewed with the patient and are negative.  I have reviewed the past medical history, past surgical history, social history and family history with the patient and they are unchanged from previous note.  ALLERGIES:  has No Known Allergies.  MEDICATIONS:  Current Outpatient Medications  Medication Sig Dispense Refill  . cephALEXin (KEFLEX) 500 MG capsule Take 1 capsule (500 mg total) by mouth 3 (three) times daily. 21 capsule 0  . acetaminophen (TYLENOL) 650 MG CR tablet Take 1,300 mg by mouth every 8 (eight) hours as needed for pain.    Marland Kitchen amLODipine (NORVASC) 10 MG tablet Take 1 tablet (10 mg total) by mouth daily. 30 tablet 1  . Chlorpheniramine Maleate (ALLERGY PO) Take 1 tablet by mouth daily.     . famotidine (PEPCID) 20 MG tablet Take 20 mg by mouth daily. OTC    . lidocaine-prilocaine (EMLA) cream Apply to affected area once 30 g 3  . magnesium oxide (MAG-OX) 400 (241.3 Mg) MG tablet Take 1 tablet (400 mg total) by mouth daily. 30 tablet 1  . Multiple Vitamin (MULTIVITAMIN) capsule Take 1 capsule by mouth daily.    Marland Kitchen  ondansetron (ZOFRAN) 8 MG tablet Take 1 tablet (8 mg total) by mouth every 8 (eight) hours as needed. Start on the third day after cisplatin chemotherapy. 30 tablet 1  . oxyCODONE (OXY IR/ROXICODONE) 5 MG immediate release tablet Take 1 tablet (5 mg total) by mouth every 6 (six) hours as needed for severe pain. 60 tablet 0  . prochlorperazine (COMPAZINE) 10 MG tablet Take 1 tablet (10 mg total) by mouth every 6  (six) hours as needed (Nausea or vomiting). 30 tablet 1   No current facility-administered medications for this visit.    PHYSICAL EXAMINATION: ECOG PERFORMANCE STATUS: 1 - Symptomatic but completely ambulatory  Vitals:   03/28/20 1345  BP: (!) 148/74  Pulse: 78  Resp: 18  Temp: 99.8 F (37.7 C)  SpO2: 100%   Filed Weights   03/28/20 1345  Weight: 159 lb 12.8 oz (72.5 kg)    GENERAL:alert, no distress and comfortable SKIN: Noted minor skin infection in her vulva NEURO: alert & oriented x 3 with fluent speech, no focal motor/sensory deficits  LABORATORY DATA:  I have reviewed the data as listed    Component Value Date/Time   NA 136 03/27/2020 1520   K 3.5 03/27/2020 1520   CL 106 03/27/2020 1520   CO2 23 03/27/2020 1520   GLUCOSE 121 (H) 03/27/2020 1520   BUN 6 03/27/2020 1520   CREATININE 0.63 03/27/2020 1520   CALCIUM 8.5 (L) 03/27/2020 1520   PROT 8.9 (H) 02/13/2020 1222   ALBUMIN 4.1 02/13/2020 1222   AST 41 02/13/2020 1222   AST 47 (H) 01/12/2020 1456   ALT 24 02/13/2020 1222   ALT 36 01/12/2020 1456   ALKPHOS 60 02/13/2020 1222   BILITOT 0.3 02/13/2020 1222   BILITOT 0.4 01/12/2020 1456   GFRNONAA >60 03/27/2020 1520    No results found for: SPEP, UPEP  Lab Results  Component Value Date   WBC 2.8 (L) 03/27/2020   NEUTROABS 1.6 (L) 03/27/2020   HGB 9.9 (L) 03/27/2020   HCT 29.7 (L) 03/27/2020   MCV 92.5 03/27/2020   PLT 127 (L) 03/27/2020      Chemistry      Component Value Date/Time   NA 136 03/27/2020 1520   K 3.5 03/27/2020 1520   CL 106 03/27/2020 1520   CO2 23 03/27/2020 1520   BUN 6 03/27/2020 1520   CREATININE 0.63 03/27/2020 1520      Component Value Date/Time   CALCIUM 8.5 (L) 03/27/2020 1520   ALKPHOS 60 02/13/2020 1222   AST 41 02/13/2020 1222   AST 47 (H) 01/12/2020 1456   ALT 24 02/13/2020 1222   ALT 36 01/12/2020 1456   BILITOT 0.3 02/13/2020 1222   BILITOT 0.4 01/12/2020 1456

## 2020-03-29 ENCOUNTER — Ambulatory Visit
Admission: RE | Admit: 2020-03-29 | Discharge: 2020-03-29 | Disposition: A | Discharge status: Home / Self Care | Payer: Medicaid Other | Source: Ambulatory Visit | Attending: Radiation Oncology | Admitting: Radiation Oncology

## 2020-03-29 ENCOUNTER — Inpatient Hospital Stay: Payer: Medicaid Other

## 2020-03-29 ENCOUNTER — Other Ambulatory Visit: Payer: Self-pay

## 2020-03-29 ENCOUNTER — Ambulatory Visit: Payer: Medicaid Other

## 2020-03-29 VITALS — BP 119/60 | HR 75 | Temp 98.3°F | Resp 18

## 2020-03-29 DIAGNOSIS — Z510 Encounter for antineoplastic radiation therapy: Secondary | ICD-10-CM | POA: Diagnosis not present

## 2020-03-29 DIAGNOSIS — Z5111 Encounter for antineoplastic chemotherapy: Secondary | ICD-10-CM | POA: Diagnosis not present

## 2020-03-29 DIAGNOSIS — C539 Malignant neoplasm of cervix uteri, unspecified: Secondary | ICD-10-CM

## 2020-03-29 MED ORDER — PALONOSETRON HCL INJECTION 0.25 MG/5ML
0.2500 mg | Freq: Once | INTRAVENOUS | Status: AC
  Administered 2020-03-29: 0.25 mg via INTRAVENOUS

## 2020-03-29 MED ORDER — SODIUM CHLORIDE 0.9 % IV SOLN
Freq: Once | INTRAVENOUS | Status: AC
  Filled 2020-03-29: qty 250

## 2020-03-29 MED ORDER — SODIUM CHLORIDE 0.9 % IV SOLN
10.0000 mg | Freq: Once | INTRAVENOUS | Status: AC
  Administered 2020-03-29: 10 mg via INTRAVENOUS
  Filled 2020-03-29: qty 10

## 2020-03-29 MED ORDER — FOSAPREPITANT DIMEGLUMINE INJECTION 150 MG
150.0000 mg | Freq: Once | INTRAVENOUS | Status: AC
  Administered 2020-03-29: 150 mg via INTRAVENOUS
  Filled 2020-03-29: qty 150

## 2020-03-29 MED ORDER — SODIUM CHLORIDE 0.9% FLUSH
10.0000 mL | INTRAVENOUS | Status: DC | PRN
  Administered 2020-03-29: 10 mL
  Filled 2020-03-29: qty 10

## 2020-03-29 MED ORDER — MAGNESIUM SULFATE 2 GM/50ML IV SOLN
INTRAVENOUS | Status: AC
  Filled 2020-03-29: qty 50

## 2020-03-29 MED ORDER — SODIUM CHLORIDE 0.9 % IV SOLN
24.0000 mg/m2 | Freq: Once | INTRAVENOUS | Status: AC
  Administered 2020-03-29: 44 mg via INTRAVENOUS
  Filled 2020-03-29: qty 44

## 2020-03-29 MED ORDER — POTASSIUM CHLORIDE IN NACL 20-0.9 MEQ/L-% IV SOLN
Freq: Once | INTRAVENOUS | Status: AC
  Filled 2020-03-29: qty 1000

## 2020-03-29 MED ORDER — MAGNESIUM SULFATE 2 GM/50ML IV SOLN
2.0000 g | Freq: Once | INTRAVENOUS | Status: AC
  Administered 2020-03-29: 2 g via INTRAVENOUS

## 2020-03-29 MED ORDER — PALONOSETRON HCL INJECTION 0.25 MG/5ML
INTRAVENOUS | Status: AC
  Filled 2020-03-29: qty 5

## 2020-03-29 MED ORDER — HEPARIN SOD (PORK) LOCK FLUSH 100 UNIT/ML IV SOLN
500.0000 [IU] | Freq: Once | INTRAVENOUS | Status: AC | PRN
  Administered 2020-03-29: 500 [IU]
  Filled 2020-03-29: qty 5

## 2020-03-29 NOTE — Patient Instructions (Signed)
[facility] Discharge Instructions for Patients Receiving Chemotherapy  Today you received the following chemotherapy agents Cisplatin (PLATINOL).  To help prevent nausea and vomiting after your treatment, we encourage you to take your nausea medication as prescribed.   If you develop nausea and vomiting that is not controlled by your nausea medication, call the clinic.   BELOW ARE SYMPTOMS THAT SHOULD BE REPORTED IMMEDIATELY:  *FEVER GREATER THAN 100.5 F  *CHILLS WITH OR WITHOUT FEVER  NAUSEA AND VOMITING THAT IS NOT CONTROLLED WITH YOUR NAUSEA MEDICATION  *UNUSUAL SHORTNESS OF BREATH  *UNUSUAL BRUISING OR BLEEDING  TENDERNESS IN MOUTH AND THROAT WITH OR WITHOUT PRESENCE OF ULCERS  *URINARY PROBLEMS  *BOWEL PROBLEMS  UNUSUAL RASH Items with * indicate a potential emergency and should be followed up as soon as possible.  Feel free to call the clinic should you have any questions or concerns. The clinic phone number is [phone].  Please show the CHEMO ALERT CARD at check-in to the Emergency Department and triage nurse.   

## 2020-04-01 ENCOUNTER — Ambulatory Visit: Payer: Medicaid Other

## 2020-04-02 ENCOUNTER — Other Ambulatory Visit: Payer: Self-pay

## 2020-04-02 ENCOUNTER — Ambulatory Visit
Admission: RE | Admit: 2020-04-02 | Discharge: 2020-04-02 | Disposition: A | Discharge status: Home / Self Care | Payer: Medicaid Other | Source: Ambulatory Visit | Attending: Radiation Oncology | Admitting: Radiation Oncology

## 2020-04-02 ENCOUNTER — Ambulatory Visit: Payer: Medicaid Other

## 2020-04-02 DIAGNOSIS — Z510 Encounter for antineoplastic radiation therapy: Secondary | ICD-10-CM | POA: Diagnosis not present

## 2020-04-03 ENCOUNTER — Inpatient Hospital Stay: Payer: Medicaid Other

## 2020-04-03 ENCOUNTER — Other Ambulatory Visit: Payer: Self-pay

## 2020-04-03 ENCOUNTER — Ambulatory Visit
Admission: RE | Admit: 2020-04-03 | Discharge: 2020-04-03 | Disposition: A | Discharge status: Home / Self Care | Payer: Medicaid Other | Source: Ambulatory Visit | Attending: Radiation Oncology | Admitting: Radiation Oncology

## 2020-04-03 ENCOUNTER — Ambulatory Visit: Payer: Medicaid Other

## 2020-04-03 DIAGNOSIS — C539 Malignant neoplasm of cervix uteri, unspecified: Secondary | ICD-10-CM

## 2020-04-03 DIAGNOSIS — Z510 Encounter for antineoplastic radiation therapy: Secondary | ICD-10-CM | POA: Diagnosis not present

## 2020-04-03 DIAGNOSIS — Z5111 Encounter for antineoplastic chemotherapy: Secondary | ICD-10-CM | POA: Diagnosis not present

## 2020-04-03 LAB — CBC WITH DIFFERENTIAL (CANCER CENTER ONLY)
Abs Immature Granulocytes: 0.02 10*3/uL (ref 0.00–0.07)
Basophils Absolute: 0 10*3/uL (ref 0.0–0.1)
Basophils Relative: 1 %
Eosinophils Absolute: 0.2 10*3/uL (ref 0.0–0.5)
Eosinophils Relative: 8 %
HCT: 29.9 % — ABNORMAL LOW (ref 36.0–46.0)
Hemoglobin: 10.1 g/dL — ABNORMAL LOW (ref 12.0–15.0)
Immature Granulocytes: 1 %
Lymphocytes Relative: 21 %
Lymphs Abs: 0.6 10*3/uL — ABNORMAL LOW (ref 0.7–4.0)
MCH: 30.8 pg (ref 26.0–34.0)
MCHC: 33.8 g/dL (ref 30.0–36.0)
MCV: 91.2 fL (ref 80.0–100.0)
Monocytes Absolute: 0.4 10*3/uL (ref 0.1–1.0)
Monocytes Relative: 14 %
Neutro Abs: 1.5 10*3/uL — ABNORMAL LOW (ref 1.7–7.7)
Neutrophils Relative %: 55 %
Platelet Count: 163 10*3/uL (ref 150–400)
RBC: 3.28 MIL/uL — ABNORMAL LOW (ref 3.87–5.11)
RDW: 15.3 % (ref 11.5–15.5)
WBC Count: 2.7 10*3/uL — ABNORMAL LOW (ref 4.0–10.5)
nRBC: 0 % (ref 0.0–0.2)

## 2020-04-03 LAB — BASIC METABOLIC PANEL - CANCER CENTER ONLY
Anion gap: 8 (ref 5–15)
BUN: 7 mg/dL (ref 6–20)
CO2: 25 mmol/L (ref 22–32)
Calcium: 8.8 mg/dL — ABNORMAL LOW (ref 8.9–10.3)
Chloride: 104 mmol/L (ref 98–111)
Creatinine: 0.62 mg/dL (ref 0.44–1.00)
GFR, Estimated: >60 mL/min (ref >60)
Glucose, Bld: 96 mg/dL (ref 70–99)
Potassium: 3.7 mmol/L (ref 3.5–5.1)
Sodium: 137 mmol/L (ref 135–145)

## 2020-04-03 LAB — MAGNESIUM: Magnesium: 1.7 mg/dL (ref 1.7–2.4)

## 2020-04-03 MED ORDER — HEPARIN SOD (PORK) LOCK FLUSH 100 UNIT/ML IV SOLN
500.0000 [IU] | Freq: Once | INTRAVENOUS | Status: AC
  Administered 2020-04-03: 500 [IU]
  Filled 2020-04-03: qty 5

## 2020-04-03 MED ORDER — SODIUM CHLORIDE 0.9% FLUSH
10.0000 mL | Freq: Once | INTRAVENOUS | Status: AC
  Administered 2020-04-03: 10 mL
  Filled 2020-04-03: qty 10

## 2020-04-04 ENCOUNTER — Ambulatory Visit: Payer: Medicaid Other

## 2020-04-04 ENCOUNTER — Inpatient Hospital Stay (HOSPITAL_BASED_OUTPATIENT_CLINIC_OR_DEPARTMENT_OTHER): Payer: Medicaid Other | Admitting: Hematology and Oncology

## 2020-04-04 ENCOUNTER — Encounter: Payer: Self-pay | Admitting: Hematology and Oncology

## 2020-04-04 ENCOUNTER — Ambulatory Visit
Admission: RE | Admit: 2020-04-04 | Discharge: 2020-04-04 | Disposition: A | Discharge status: Home / Self Care | Payer: Medicaid Other | Source: Ambulatory Visit | Attending: Radiation Oncology | Admitting: Radiation Oncology

## 2020-04-04 DIAGNOSIS — G893 Neoplasm related pain (acute) (chronic): Secondary | ICD-10-CM | POA: Diagnosis not present

## 2020-04-04 DIAGNOSIS — Z510 Encounter for antineoplastic radiation therapy: Secondary | ICD-10-CM | POA: Diagnosis not present

## 2020-04-04 DIAGNOSIS — R030 Elevated blood-pressure reading, without diagnosis of hypertension: Secondary | ICD-10-CM | POA: Diagnosis not present

## 2020-04-04 DIAGNOSIS — Z5111 Encounter for antineoplastic chemotherapy: Secondary | ICD-10-CM | POA: Diagnosis not present

## 2020-04-04 DIAGNOSIS — C530 Malignant neoplasm of endocervix: Secondary | ICD-10-CM | POA: Diagnosis not present

## 2020-04-04 NOTE — Progress Notes (Signed)
[place] OFFICE PROGRESS NOTE  Patient Care Team: Patient, No Pcp Per as PCP - General (General Practice) Awanda Mink, Craige Cotta, RN as Oncology Nurse Navigator (Oncology)  ASSESSMENT & PLAN:  Malignant neoplasm of cervix [facility]) She is recovering well from side effects of chemotherapy Pancytopenia is slowly improving Continue neutropenic precaution She does not need transfusion support Plan to recheck it again when I see her back in April  Elevated BP without diagnosis of hypertension Her blood pressure is improving She will continue amlodipine  Cancer associated pain Her pain is well controlled I recommend pain medicine taper over the next few weeks We discussed narcotic refill policy   No orders of the defined types were placed in this encounter.   All questions were answered. The patient knows to call the clinic with any problems, questions or concerns. The total time spent in the appointment was 20 minutes encounter with patients including review of chart and various tests results, discussions about plan of care and coordination of care plan   Heath Lark, MD 04/04/2020 3:33 PM  INTERVAL HISTORY: Please see below for problem oriented charting. She returns for further follow-up She is doing well She will be finishing radiation soon She has no further vaginal bleeding Her pain is better controlled but she is still taking oxycodone 3 times a day  SUMMARY OF ONCOLOGIC HISTORY: Oncology History  Malignant neoplasm of cervix ([place])  12/11/2019 Initial Diagnosis   She presented with postmenopausal bleeding   01/12/2020 Initial Diagnosis   Malignant neoplasm of cervix ([place])   01/12/2020 Pathology Results   FINAL MICROSCOPIC DIAGNOSIS:   A. CERVIX, 3:00, 5:00, BIOPSY:  -  Invasive squamous cell carcinoma arising in a background of high-grade dysplasia  -  See comment   B.  ENDOCERVIX, CURETTAGE:  -  High grade squamous intraepithelial lesion (CIN 3; severe  dysplasia/carcinoma in situ)    01/17/2020 Pathology Results   FINAL MICROSCOPIC DIAGNOSIS:   A. VAGINOCERVICAL JUNCTION, 6:00, BIOPSY:  - High grade squamous intraepithelial lesion, CIN-II/VAIN-II (moderate dysplasia).   B. VAGINOCERVICAL JUNCTION, 4:00, BIOPSY:  - High grade squamous intraepithelial lesion, CIN-III/VAIN-III (severe dysplasia/CIS).   C. VAGINOCERVICAL JUNCTION, 12:00, BIOPSY:  - Invasive squamous cell carcinoma, see comment.   D. ENDOMETRIUM, CURETTAGE:  - Predominately blood with fragments of high grade squamous intraepithelial lesion.   E. ENDOCERVIX, CURETTAGE:  - Invasive squamous cell carcinoma, see comment   01/17/2020 Surgery   Postoperative Diagnosis: Clinical stage IIB disease due to parametria thickening   Surgery: EUA, multiple cervical biopsies, ECC, EMB    Surgeons:  Jeral Pinch MD  Pathology: Biopsies at 4 and 6  o'c at the posterior cervicovaginal junction, biopsy from anterior cervix at 92 o'c, ECC, EMB   Operative findings: Cervix barrel shaped and firm, visible lesion that is friable spanning most of the posterior cervix (approximately 2 x 3.5cm). Cervix is flush with the vagina posteriorly; lesion abuts the cervico-vaginal junction. Very gritty endocervical canal noted on ECC. EMB revealed somewhat purulent appearing blood. On rectovaginal exam, concerning for thickening posteriorly in the midline and to the left, concerning for parametria involvement.   01/26/2020 Cancer Staging   Staging form: Cervix Uteri, AJCC Version 9 - Clinical stage from 01/26/2020: FIGO Stage IIB (cT2b, cN0, cM0) - Signed by Heath Lark, MD on 01/29/2020   01/26/2020 Imaging   1. Known cervical malignancy is not well appreciated. There is no evidence of lymphadenopathy or metastatic disease in the abdomen or pelvis. 2. Probable  fibroid of the anterior uterine body, not well delineated by CT.   Aortic Atherosclerosis (ICD10-I70.0).     02/13/2020 Procedure    Successful placement of a power injectable Port-A-Cath via the right internal jugular vein. The catheter is ready for immediate use.   02/21/2020 PET scan   1. Cervical and lower uterine segment mass with maximum SUV 12.2, compatible with malignancy. 2. Although the 7 mm right common iliac node questioned on recent pelvic MRI has only low-grade activity similar to blood pool on today's examination, there is a small focus of cutaneous nodularity along the lateral margin of the left labium majus which could conceivably represent a small cutaneous metastatic lymph node, although urinary contamination can sometimes cause a similar appearance. Physical exam inspection of this region along the skin fold between the left labium majus and left upper thigh region with attention to any underlying nodularity is suggested. 3. 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 If this is not a known condition, cardiology referral for further workup may be warranted. 4.  Aortic Atherosclerosis (ICD10-I70.0).   02/23/2020 -  Chemotherapy    Patient is on Treatment Plan: CERVICAL CISPLATIN Q7D        REVIEW OF SYSTEMS:   Constitutional: Denies fevers, chills or abnormal weight loss Eyes: Denies blurriness of vision Ears, nose, mouth, throat, and face: Denies mucositis or sore throat Respiratory: Denies cough, dyspnea or wheezes Cardiovascular: Denies palpitation, chest discomfort or lower extremity swelling Gastrointestinal:  Denies nausea, heartburn or change in bowel habits Skin: Denies abnormal skin rashes Lymphatics: Denies new lymphadenopathy or easy bruising Neurological:Denies numbness, tingling or new weaknesses Behavioral/Psych: Mood is stable, no new changes  All other systems were reviewed with the patient and are negative.  I have reviewed the past medical history, past surgical history, social history and family history with the patient and they are unchanged from  previous note.  ALLERGIES:  has No Known Allergies.  MEDICATIONS:  Current Outpatient Medications  Medication Sig Dispense Refill  . acetaminophen (TYLENOL) 650 MG CR tablet Take 1,300 mg by mouth every 8 (eight) hours as needed for pain.    Marland Kitchen amLODipine (NORVASC) 10 MG tablet Take 1 tablet (10 mg total) by mouth daily. 30 tablet 1  . Chlorpheniramine Maleate (ALLERGY PO) Take 1 tablet by mouth daily.     . famotidine (PEPCID) 20 MG tablet Take 20 mg by mouth daily. OTC    . lidocaine-prilocaine (EMLA) cream Apply to affected area once 30 g 3  . Multiple Vitamin (MULTIVITAMIN) capsule Take 1 capsule by mouth daily.    . ondansetron (ZOFRAN) 8 MG tablet Take 1 tablet (8 mg total) by mouth every 8 (eight) hours as needed. Start on the third day after cisplatin chemotherapy. 30 tablet 1  . oxyCODONE (OXY IR/ROXICODONE) 5 MG immediate release tablet Take 1 tablet (5 mg total) by mouth every 6 (six) hours as needed for severe pain. 60 tablet 0  . prochlorperazine (COMPAZINE) 10 MG tablet Take 1 tablet (10 mg total) by mouth every 6 (six) hours as needed (Nausea or vomiting). 30 tablet 1   No current facility-administered medications for this visit.    PHYSICAL EXAMINATION: ECOG PERFORMANCE STATUS: 1 - Symptomatic but completely ambulatory  Vitals:   04/04/20 1404  BP: 138/75  Pulse: 79  Resp: 20  Temp: 98.8 F (37.1 C)  SpO2: 98%   Filed Weights   04/04/20 1404  Weight: 158 lb 9.6 oz (71.9 kg)  GENERAL:alert, no distress and comfortable NEURO: alert & oriented x 3 with fluent speech, no focal motor/sensory deficits  LABORATORY DATA:  I have reviewed the data as listed    Component Value Date/Time   NA 137 04/03/2020 1456   K 3.7 04/03/2020 1456   CL 104 04/03/2020 1456   CO2 25 04/03/2020 1456   GLUCOSE 96 04/03/2020 1456   BUN 7 04/03/2020 1456   CREATININE 0.62 04/03/2020 1456   CALCIUM 8.8 (L) 04/03/2020 1456   PROT 8.9 (H) 02/13/2020 1222   ALBUMIN 4.1 02/13/2020  1222   AST 41 02/13/2020 1222   AST 47 (H) 01/12/2020 1456   ALT 24 02/13/2020 1222   ALT 36 01/12/2020 1456   ALKPHOS 60 02/13/2020 1222   BILITOT 0.3 02/13/2020 1222   BILITOT 0.4 01/12/2020 1456   GFRNONAA >60 04/03/2020 1456    No results found for: SPEP, UPEP  Lab Results  Component Value Date   WBC 2.7 (L) 04/03/2020   NEUTROABS 1.5 (L) 04/03/2020   HGB 10.1 (L) 04/03/2020   HCT 29.9 (L) 04/03/2020   MCV 91.2 04/03/2020   PLT 163 04/03/2020      Chemistry      Component Value Date/Time   NA 137 04/03/2020 1456   K 3.7 04/03/2020 1456   CL 104 04/03/2020 1456   CO2 25 04/03/2020 1456   BUN 7 04/03/2020 1456   CREATININE 0.62 04/03/2020 1456      Component Value Date/Time   CALCIUM 8.8 (L) 04/03/2020 1456   ALKPHOS 60 02/13/2020 1222   AST 41 02/13/2020 1222   AST 47 (H) 01/12/2020 1456   ALT 24 02/13/2020 1222   ALT 36 01/12/2020 1456   BILITOT 0.3 02/13/2020 1222   BILITOT 0.4 01/12/2020 1456

## 2020-04-04 NOTE — Assessment & Plan Note (Signed)
Her blood pressure is improving She will continue amlodipine

## 2020-04-04 NOTE — Assessment & Plan Note (Signed)
She is recovering well from side effects of chemotherapy Pancytopenia is slowly improving Continue neutropenic precaution She does not need transfusion support Plan to recheck it again when I see her back in April

## 2020-04-04 NOTE — Assessment & Plan Note (Signed)
Her pain is well controlled I recommend pain medicine taper over the next few weeks We discussed narcotic refill policy

## 2020-04-05 ENCOUNTER — Ambulatory Visit
Admission: RE | Admit: 2020-04-05 | Discharge: 2020-04-05 | Disposition: A | Discharge status: Home / Self Care | Payer: Medicaid Other | Source: Ambulatory Visit | Attending: Radiation Oncology | Admitting: Radiation Oncology

## 2020-04-05 ENCOUNTER — Other Ambulatory Visit: Payer: Self-pay

## 2020-04-05 ENCOUNTER — Ambulatory Visit: Payer: Medicaid Other

## 2020-04-05 DIAGNOSIS — Z510 Encounter for antineoplastic radiation therapy: Secondary | ICD-10-CM | POA: Diagnosis not present

## 2020-04-08 ENCOUNTER — Other Ambulatory Visit: Payer: Self-pay

## 2020-04-08 ENCOUNTER — Ambulatory Visit: Payer: Medicaid Other

## 2020-04-08 ENCOUNTER — Ambulatory Visit
Admission: RE | Admit: 2020-04-08 | Discharge: 2020-04-08 | Disposition: A | Discharge status: Home / Self Care | Payer: Medicaid Other | Source: Ambulatory Visit | Attending: Radiation Oncology | Admitting: Radiation Oncology

## 2020-04-08 DIAGNOSIS — Z510 Encounter for antineoplastic radiation therapy: Secondary | ICD-10-CM | POA: Diagnosis not present

## 2020-04-09 ENCOUNTER — Ambulatory Visit: Payer: Medicaid Other

## 2020-04-10 ENCOUNTER — Ambulatory Visit: Payer: Medicaid Other

## 2020-04-12 ENCOUNTER — Other Ambulatory Visit: Payer: Self-pay | Admitting: Hematology and Oncology

## 2020-04-15 ENCOUNTER — Other Ambulatory Visit (HOSPITAL_COMMUNITY)
Admission: RE | Admit: 2020-04-15 | Discharge: 2020-04-15 | Disposition: A | Discharge status: Home / Self Care | Payer: Medicaid Other | Source: Ambulatory Visit | Attending: Radiation Oncology | Admitting: Radiation Oncology

## 2020-04-15 ENCOUNTER — Encounter (HOSPITAL_BASED_OUTPATIENT_CLINIC_OR_DEPARTMENT_OTHER): Payer: Self-pay | Admitting: Radiation Oncology

## 2020-04-15 ENCOUNTER — Other Ambulatory Visit: Payer: Self-pay

## 2020-04-15 DIAGNOSIS — Z01812 Encounter for preprocedural laboratory examination: Secondary | ICD-10-CM | POA: Diagnosis present

## 2020-04-15 DIAGNOSIS — Z20822 Contact with and (suspected) exposure to covid-19: Secondary | ICD-10-CM | POA: Diagnosis not present

## 2020-04-15 LAB — SARS CORONAVIRUS 2 (TAT 6-24 HRS): SARS Coronavirus 2: NEGATIVE

## 2020-04-15 NOTE — Progress Notes (Addendum)
Spoke w/ via phone for pre-op interview---pt Lab needs dos----cbc with dif, bmet, urine poct, ekg               Lab results------none COVID test ------04-15-2020 1415 Arrive at -------800 am 04-18-2020 NPO after MN NO Solid Food.  Clear liquids from MN until---700 am then npo Medications to take morning of surgery -----famotidine, oxycodone prn, allergy pill Diabetic medication -----n/a Patient Special Instructions -----none Pre-Op special Istructions -----none Patient verbalized understanding of instructions that were given at this phone interview. Patient denies shortness of breath, chest pain, fever, cough at this phone interview.

## 2020-04-16 ENCOUNTER — Other Ambulatory Visit: Payer: Self-pay | Admitting: Hematology and Oncology

## 2020-04-16 MED ORDER — OXYCODONE HCL 5 MG PO TABS
5.0000 mg | ORAL_TABLET | Freq: Four times a day (QID) | ORAL | 0 refills | Status: DC | PRN
Start: 2020-04-16 — End: 2020-05-29

## 2020-04-18 ENCOUNTER — Ambulatory Visit
Admission: RE | Admit: 2020-04-18 | Discharge: 2020-04-18 | Disposition: A | Discharge status: Home / Self Care | Payer: Medicaid Other | Source: Ambulatory Visit | Attending: Radiation Oncology | Admitting: Radiation Oncology

## 2020-04-18 ENCOUNTER — Encounter (HOSPITAL_BASED_OUTPATIENT_CLINIC_OR_DEPARTMENT_OTHER)
Admission: RE | Disposition: A | Discharge status: Home / Self Care | Payer: Self-pay | Source: Home / Self Care | Attending: Radiation Oncology

## 2020-04-18 ENCOUNTER — Other Ambulatory Visit: Payer: Self-pay

## 2020-04-18 ENCOUNTER — Ambulatory Visit (HOSPITAL_BASED_OUTPATIENT_CLINIC_OR_DEPARTMENT_OTHER)
Admission: RE | Admit: 2020-04-18 | Discharge: 2020-04-18 | Disposition: A | Discharge status: Home / Self Care | Payer: Medicaid Other | Attending: Radiation Oncology | Admitting: Radiation Oncology

## 2020-04-18 ENCOUNTER — Encounter (HOSPITAL_BASED_OUTPATIENT_CLINIC_OR_DEPARTMENT_OTHER): Payer: Self-pay | Admitting: Radiation Oncology

## 2020-04-18 ENCOUNTER — Ambulatory Visit (HOSPITAL_BASED_OUTPATIENT_CLINIC_OR_DEPARTMENT_OTHER): Payer: Medicaid Other | Admitting: Anesthesiology

## 2020-04-18 ENCOUNTER — Ambulatory Visit (HOSPITAL_COMMUNITY)
Admission: RE | Admit: 2020-04-18 | Discharge: 2020-04-18 | Disposition: A | Discharge status: Home / Self Care | Payer: Medicaid Other | Source: Ambulatory Visit | Attending: Radiation Oncology | Admitting: Radiation Oncology

## 2020-04-18 VITALS — BP 145/74 | HR 65 | Temp 96.9°F | Resp 18

## 2020-04-18 DIAGNOSIS — Z800 Family history of malignant neoplasm of digestive organs: Secondary | ICD-10-CM | POA: Diagnosis not present

## 2020-04-18 DIAGNOSIS — Z808 Family history of malignant neoplasm of other organs or systems: Secondary | ICD-10-CM | POA: Insufficient documentation

## 2020-04-18 DIAGNOSIS — C530 Malignant neoplasm of endocervix: Secondary | ICD-10-CM | POA: Diagnosis not present

## 2020-04-18 DIAGNOSIS — Z8616 Personal history of COVID-19: Secondary | ICD-10-CM | POA: Diagnosis not present

## 2020-04-18 DIAGNOSIS — Z9221 Personal history of antineoplastic chemotherapy: Secondary | ICD-10-CM | POA: Insufficient documentation

## 2020-04-18 DIAGNOSIS — Z923 Personal history of irradiation: Secondary | ICD-10-CM | POA: Insufficient documentation

## 2020-04-18 DIAGNOSIS — Z87891 Personal history of nicotine dependence: Secondary | ICD-10-CM | POA: Diagnosis not present

## 2020-04-18 DIAGNOSIS — Z510 Encounter for antineoplastic radiation therapy: Secondary | ICD-10-CM | POA: Insufficient documentation

## 2020-04-18 DIAGNOSIS — C539 Malignant neoplasm of cervix uteri, unspecified: Secondary | ICD-10-CM

## 2020-04-18 DIAGNOSIS — Z833 Family history of diabetes mellitus: Secondary | ICD-10-CM | POA: Insufficient documentation

## 2020-04-18 HISTORY — DX: Malignant neoplasm of cervix uteri, unspecified: C53.9

## 2020-04-18 HISTORY — DX: Presence of spectacles and contact lenses: Z97.3

## 2020-04-18 HISTORY — PX: OPERATIVE ULTRASOUND: SHX5996

## 2020-04-18 HISTORY — PX: TANDEM RING INSERTION: SHX6199

## 2020-04-18 HISTORY — DX: Essential (primary) hypertension: I10

## 2020-04-18 LAB — BASIC METABOLIC PANEL
Anion gap: 8 (ref 5–15)
BUN: 10 mg/dL (ref 6–20)
CO2: 23 mmol/L (ref 22–32)
Calcium: 9 mg/dL (ref 8.9–10.3)
Chloride: 103 mmol/L (ref 98–111)
Creatinine, Ser: 0.58 mg/dL (ref 0.44–1.00)
GFR, Estimated: >60 mL/min (ref >60)
Glucose, Bld: 98 mg/dL (ref 70–99)
Potassium: 3.8 mmol/L (ref 3.5–5.1)
Sodium: 134 mmol/L — ABNORMAL LOW (ref 135–145)

## 2020-04-18 LAB — CBC WITH DIFFERENTIAL/PLATELET
Abs Immature Granulocytes: 0.03 10*3/uL (ref 0.00–0.07)
Basophils Absolute: 0 10*3/uL (ref 0.0–0.1)
Basophils Relative: 1 %
Eosinophils Absolute: 0.3 10*3/uL (ref 0.0–0.5)
Eosinophils Relative: 8 %
HCT: 34.7 % — ABNORMAL LOW (ref 36.0–46.0)
Hemoglobin: 11.4 g/dL — ABNORMAL LOW (ref 12.0–15.0)
Immature Granulocytes: 1 %
Lymphocytes Relative: 17 %
Lymphs Abs: 0.7 10*3/uL (ref 0.7–4.0)
MCH: 31.2 pg (ref 26.0–34.0)
MCHC: 32.9 g/dL (ref 30.0–36.0)
MCV: 95.1 fL (ref 80.0–100.0)
Monocytes Absolute: 0.6 10*3/uL (ref 0.1–1.0)
Monocytes Relative: 16 %
Neutro Abs: 2.2 10*3/uL (ref 1.7–7.7)
Neutrophils Relative %: 57 %
Platelets: 221 10*3/uL (ref 150–400)
RBC: 3.65 MIL/uL — ABNORMAL LOW (ref 3.87–5.11)
RDW: 17.7 % — ABNORMAL HIGH (ref 11.5–15.5)
WBC: 3.8 10*3/uL — ABNORMAL LOW (ref 4.0–10.5)
nRBC: 0 % (ref 0.0–0.2)

## 2020-04-18 LAB — POCT PREGNANCY, URINE: Preg Test, Ur: NEGATIVE

## 2020-04-18 SURGERY — INSERTION, UTERINE TANDEM AND RING OR CYLINDER, FOR BRACHYTHERAPY
Anesthesia: General | Site: Vagina

## 2020-04-18 MED ORDER — LACTATED RINGERS IV SOLN: INTRAVENOUS | Status: DC

## 2020-04-18 MED ORDER — ONDANSETRON HCL 4 MG/2ML IJ SOLN
INTRAMUSCULAR | Status: AC
  Filled 2020-04-18: qty 2

## 2020-04-18 MED ORDER — MEPERIDINE HCL 25 MG/ML IJ SOLN: 6.2500 mg | INTRAMUSCULAR | Status: DC | PRN

## 2020-04-18 MED ORDER — LIDOCAINE HCL (CARDIAC) PF 100 MG/5ML IV SOSY
PREFILLED_SYRINGE | INTRAVENOUS | Status: DC | PRN
  Administered 2020-04-18: 50 mg via INTRAVENOUS

## 2020-04-18 MED ORDER — FENTANYL CITRATE (PF) 100 MCG/2ML IJ SOLN
INTRAMUSCULAR | Status: AC
  Filled 2020-04-18: qty 2

## 2020-04-18 MED ORDER — ROCURONIUM BROMIDE 10 MG/ML (PF) SYRINGE
PREFILLED_SYRINGE | INTRAVENOUS | Status: AC
  Filled 2020-04-18: qty 10

## 2020-04-18 MED ORDER — POVIDONE-IODINE 10 % EX SWAB: 2.0000 "application " | Freq: Once | CUTANEOUS | Status: DC

## 2020-04-18 MED ORDER — MIDAZOLAM HCL 2 MG/2ML IJ SOLN
INTRAMUSCULAR | Status: DC | PRN
  Administered 2020-04-18: 1 mg via INTRAVENOUS

## 2020-04-18 MED ORDER — OXYCODONE HCL 5 MG/5ML PO SOLN: 5.0000 mg | Freq: Once | ORAL | Status: DC | PRN

## 2020-04-18 MED ORDER — OXYCODONE HCL 5 MG PO TABS: 5.0000 mg | ORAL_TABLET | Freq: Once | ORAL | Status: DC | PRN

## 2020-04-18 MED ORDER — AMISULPRIDE (ANTIEMETIC) 5 MG/2ML IV SOLN: 10.0000 mg | Freq: Once | INTRAVENOUS | Status: DC | PRN

## 2020-04-18 MED ORDER — LIDOCAINE 2% (20 MG/ML) 5 ML SYRINGE
INTRAMUSCULAR | Status: AC
  Filled 2020-04-18: qty 5

## 2020-04-18 MED ORDER — KETOROLAC TROMETHAMINE 30 MG/ML IJ SOLN
INTRAMUSCULAR | Status: AC
  Filled 2020-04-18: qty 1

## 2020-04-18 MED ORDER — DEXAMETHASONE SODIUM PHOSPHATE 10 MG/ML IJ SOLN
INTRAMUSCULAR | Status: AC
  Filled 2020-04-18: qty 1

## 2020-04-18 MED ORDER — HYDROMORPHONE HCL 1 MG/ML IJ SOLN
0.5000 mg | Freq: Once | INTRAMUSCULAR | Status: AC
  Administered 2020-04-18: 0.5 mg via INTRAVENOUS
  Filled 2020-04-18: qty 1

## 2020-04-18 MED ORDER — FENTANYL CITRATE (PF) 100 MCG/2ML IJ SOLN
INTRAMUSCULAR | Status: DC | PRN
  Administered 2020-04-18: 50 ug via INTRAVENOUS
  Administered 2020-04-18 (×2): 25 ug via INTRAVENOUS

## 2020-04-18 MED ORDER — PROPOFOL 10 MG/ML IV BOLUS
INTRAVENOUS | Status: DC | PRN
  Administered 2020-04-18: 120 mg via INTRAVENOUS

## 2020-04-18 MED ORDER — FENTANYL CITRATE (PF) 100 MCG/2ML IJ SOLN
25.0000 ug | INTRAMUSCULAR | Status: DC | PRN
  Administered 2020-04-18 (×2): 50 ug via INTRAVENOUS

## 2020-04-18 MED ORDER — ONDANSETRON HCL 4 MG/2ML IJ SOLN
INTRAMUSCULAR | Status: DC | PRN
  Administered 2020-04-18: 4 mg via INTRAVENOUS

## 2020-04-18 MED ORDER — SODIUM CHLORIDE 0.9 % IR SOLN
Status: DC | PRN
  Administered 2020-04-18: 300 mL

## 2020-04-18 MED ORDER — DEXAMETHASONE SODIUM PHOSPHATE 4 MG/ML IJ SOLN
INTRAMUSCULAR | Status: DC | PRN
  Administered 2020-04-18: 8 mg via INTRAVENOUS

## 2020-04-18 MED ORDER — MIDAZOLAM HCL 2 MG/2ML IJ SOLN
INTRAMUSCULAR | Status: AC
  Filled 2020-04-18: qty 2

## 2020-04-18 SURGICAL SUPPLY — 20 items
BNDG CONFORM 2 STRL LF (GAUZE/BANDAGES/DRESSINGS) IMPLANT
COVER WAND RF STERILE (DRAPES) ×3 IMPLANT
DILATOR CANAL MILEX (MISCELLANEOUS) IMPLANT
DRSG PAD ABDOMINAL 8X10 ST (GAUZE/BANDAGES/DRESSINGS) ×3 IMPLANT
GAUZE 4X4 16PLY RFD (DISPOSABLE) ×3 IMPLANT
GLOVE SURG ENC MOIS LTX SZ7.5 (GLOVE) ×6 IMPLANT
GOWN STRL REUS W/TWL LRG LVL3 (GOWN DISPOSABLE) ×3 IMPLANT
HOLDER FOLEY CATH W/STRAP (MISCELLANEOUS) ×3 IMPLANT
IV NS 1000ML (IV SOLUTION) ×1
IV NS 1000ML BAXH (IV SOLUTION) ×2 IMPLANT
IV SET EXTENSION GRAVITY 40 LF (IV SETS) ×3 IMPLANT
KIT TURNOVER CYSTO (KITS) ×3 IMPLANT
MAT PREVALON FULL STRYKER (MISCELLANEOUS) ×3 IMPLANT
NS IRRIG 500ML POUR BTL (IV SOLUTION) ×3 IMPLANT
PACK VAGINAL MINOR WOMEN LF (CUSTOM PROCEDURE TRAY) ×3 IMPLANT
PACKING VAGINAL (PACKING) IMPLANT
PAD OB MATERNITY 4.3X12.25 (PERSONAL CARE ITEMS) IMPLANT
TOWEL OR 17X26 10 PK STRL BLUE (TOWEL DISPOSABLE) ×3 IMPLANT
TRAY FOLEY W/BAG SLVR 14FR LF (SET/KITS/TRAYS/PACK) ×3 IMPLANT
WATER STERILE IRR 500ML POUR (IV SOLUTION) IMPLANT

## 2020-04-18 NOTE — H&P (View-Only) (Signed)
Radiation Oncology         [phone] ________________________________  Initial Outpatient Consultation  Name: Madeline Howard MRN: [id number]  Date: 03/20/2020  DOB: 12/14/1965     DIAGNOSIS: The primary encounter diagnosis was Malignant neoplasm of cervix, unspecified site (HCC). A diagnosis of Cancer of cervix (HCC) was also pertinent to this visit.   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 female who was diagnosed with stage IIb squamous cell carcinoma cervix.  She has recently completed her external beam radiation therapy and radiosensitizing chemotherapy.  She now presents to the operating room for her first brachytherapy procedure in preparation for high-dose-rate radiation therapy.   the patient presented to Dr. Riddick on 12/28/2019 for evaluation of postmenopausal bleeding with cramping. An endometrial biopsy was performed at that time and revealed poorly differentiated carcinoma. A PAP smear was also performed and show highly dysplastic squamous cells c/w squamous cell carcinoma. 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   Given the above findings, the patient was referred to Dr. Tucker and was seen in consultation on 01/12/2020. A cervical biopsy was taken at that time that showed invasive squamous cell carcinoma arising in the background of high-grade dysplasia. Endocervix curettage was also performed and showed high grade squamous intraepithelial lesion (CIN 3; severe dysplasia/carcinoma in situ).  The patient underwent a EUA, multiple cervical biopsies, ECC, and EMB on 01/17/2020. Final pathology revealed high grade squamous intraepithelial lesion, CIN-II/VAIN-11 (moderate dysplasia) of the vaginocervical junction at the 6 o'clock position and high grade intraepithelial lesion, CIN-III/VAIN III (severe dysplasia/CIS) at the 4 o'clock position. Invasive squamous cell carcinoma was also seen at the 12 o'clock position  of the vaginocervical junction. Endometrium curettage showed predominantly blood with fragments of high grade squamous intraepithelial lesion. Endocervix curettage showed invasive squamous cell carcinoma.  Exam by Dr. Tucker was concerning for parametrial involvement.  CT scan of abdomen and pelvis on 01/26/2020 did not appreciate the known cervical malignancy. There was no evidence of lymphadenopathy or metastatic disease in the abdomen or pelvis. There was, however, a probable fibroid of the anterior uterine body that was not well delineated by CT.  The patient was seen by Dr. Gorsuch on 01/29/2020, during which time they discussed proceeding with concurrent chemoradiation therapy with weekly Cisplatin.  The patient's case was discussed at the Gynecologic Oncology Multi-Disciplinary Conference on 02/05/2020. It was recommended that she proceed with MRI followed by definitive treatment with radiation and chemotherapy or consideration for surgery.   MRI results: 6.5 cm infiltrative mass throughout the cervix, lower uterine segment, and uterine corpus, with mild hydrometros, consistent with known cervical carcinoma.  Suspected parametrial invasion along the right lateral surgical margin and the right anterior lower uterine segment. Possible involvement of posterior bladder wall and an adjacent small bowel loop. (FIGO stage IVA)  8 mm right common iliac lymph node. Lymph node metastasis cannot be excluded. PET-CT scan could be performed for further evaluation.  The patient was not felt to be a candidate for surgery given the MRI findings.  PREVIOUS RADIATION THERAPY: No  PAST MEDICAL HISTORY:  Past Medical History:  Diagnosis Date  . Allergy   . Cervical cancer (HCC)   . 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 GERD (gastroesophageal reflux disease)   . Hypertension   . Wears glasses     PAST SURGICAL HISTORY: Past Surgical History:  Procedure Laterality Date   .   IR IMAGING GUIDED PORT INSERTION  02/13/2020    FAMILY HISTORY:  Family History  Problem Relation Age of Onset  . Cancer Mother        colon  . Diabetes Father   . Cancer Brother        melanoma  . Breast cancer Neg Hx   . Uterine cancer Neg Hx   . Ovarian cancer Neg Hx     SOCIAL HISTORY:  Social History   Tobacco Use  . Smoking status: Former Smoker    Packs/day: 1.50    Years: 15.00    Pack years: 22.50    Types: Cigarettes, E-cigarettes    Quit date: 07/15/2016    Years since quitting: 3.7  . Smokeless tobacco: Never Used  Vaping Use  . Vaping Use: Every day  . Start date: 07/15/2016  . Substances: Nicotine, Flavoring  Substance Use Topics  . Alcohol use: Never  . Drug use: Never    ALLERGIES: No Known Allergies  MEDICATIONS:  Current Facility-Administered Medications  Medication Dose Route Frequency Provider Last Rate Last Admin  . lactated ringers infusion   Intravenous Continuous Darral Dash, DO 50 mL/hr at 04/18/20 0815 New Bag at 04/18/20 0815  . povidone-iodine 10 % swab 2 application  2 application Topical Once Gery Pray, MD        REVIEW OF SYSTEMS:  A 10+ POINT REVIEW OF SYSTEMS WAS OBTAINED including neurology, dermatology, psychiatry, cardiac, respiratory, lymph, extremities, GI, GU, musculoskeletal, constitutional, reproductive, HEENT.  She denies any pelvic pain urination difficulties or bowel complaints.  She denies any vaginal bleeding.  In retrospect she did notice some mild postcoital bleeding over the past few months.   PHYSICAL EXAM:  height is 5\' 6"  (1.676 m) and weight is 158 lb (71.7 kg). Her oral temperature is 98 F (36.7 C). Her blood pressure is 142/74 (abnormal) and her pulse is 77. Her respiration is 15 and oxygen saturation is 98%.   General: Alert and oriented, in no acute distress HEENT: Head is normocephalic. Extraocular movements are intact. Oropharynx is clear. Neck: Neck is supple, no palpable cervical or  supraclavicular lymphadenopathy. Heart: Regular in rate and rhythm with no murmurs, rubs, or gallops. Chest: Clear to auscultation bilaterally, with no rhonchi, wheezes, or rales. Abdomen: Soft, nontender, nondistended, with no rigidity or guarding. Extremities: No cyanosis or edema. Lymphatics: see Neck Exam Skin: No concerning lesions. Musculoskeletal: symmetric strength and muscle tone throughout. Neurologic: Cranial nerves II through XII are grossly intact. No obvious focalities. Speech is fluent. Coordination is intact. Psychiatric: Judgment and insight are intact. Affect is appropriate. Pelvic exam: Pelvic exam to be performed under anesthesia  ECOG = 1  0 - Asymptomatic (Fully active, able to carry on all predisease activities without restriction)  1 - Symptomatic but completely ambulatory (Restricted in physically strenuous activity but ambulatory and able to carry out work of a light or sedentary nature. For example, light housework, office work)  2 - Symptomatic, <50% in bed during the day (Ambulatory and capable of all self care but unable to carry out any work activities. Up and about more than 50% of waking hours)  3 - Symptomatic, >50% in bed, but not bedbound (Capable of only limited self-care, confined to bed or chair 50% or more of waking hours)  4 - Bedbound (Completely disabled. Cannot carry on any self-care. Totally confined to bed or chair)  5 - Death   Eustace Pen MM, Creech RH, Tormey DC, et al. [phone]). "Toxicity  and response criteria of the [facility]". [place] Oncol. 5 (6): 649-55  LABORATORY DATA:  Lab Results  Component Value Date   WBC 3.8 (L) 04/18/2020   HGB 11.4 (L) 04/18/2020   HCT 34.7 (L) 04/18/2020   MCV 95.1 04/18/2020   PLT 221 04/18/2020   NEUTROABS 2.2 04/18/2020   Lab Results  Component Value Date   NA 137 04/03/2020   K 3.7 04/03/2020   CL 104 04/03/2020   CO2 25 04/03/2020   GLUCOSE 96 04/03/2020   CREATININE  0.62 04/03/2020   CALCIUM 8.8 (L) 04/03/2020      RADIOGRAPHY: No results found.    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  This will complete her definitive course of radiation therapy.  PLAN: The patient will be taken to the operating room on March 10 for exam under anesthesia.  Dilation of the cervical os and placement of tandem ring in preparation for high-dose-rate radiation therapy.  Iridium 192 will be the high-dose-rate source.  She is scheduled to receive 5 high-dose-rate treatments directed at the cervical region.   ------------------------------------------------  Blair Promise, PhD, MD

## 2020-04-18 NOTE — Anesthesia Preprocedure Evaluation (Signed)
Anesthesia Evaluation  Patient identified by MRN, date of birth, ID band Patient awake    Reviewed: Allergy & Precautions, NPO status , Patient's Chart, lab work & pertinent test results  History of Anesthesia Complications Negative for: history of anesthetic complications  Airway Mallampati: II  TM Distance: >3 FB Neck ROM: Full    Dental  (+) Missing, Dental Advisory Given, Teeth Intact,    Pulmonary Current Smoker and Patient abstained from smoking., former smoker,  Covid-19 Nucleic Acid Test Results Lab Results      Component                Value               Date                      [place]              NEGATIVE            01/13/2020              breath sounds clear to auscultation       Cardiovascular hypertension, negative cardio ROS   Rhythm:Regular     Neuro/Psych negative neurological ROS  negative psych ROS   GI/Hepatic Neg liver ROS, GERD  Medicated and Controlled,  Endo/Other  negative endocrine ROSNo results found for: HGBA1C   Renal/GU Lab Results      Component                Value               Date                      CREATININE               0.70                01/12/2020                Musculoskeletal negative musculoskeletal ROS (+)   Abdominal   Peds  Hematology negative hematology ROS (+) anemia , Lab Results      Component                Value               Date                      WBC                      6.8                 01/15/2020                HGB                      12.4                01/15/2020                HCT                      37.7                01/15/2020                MCV  96.4                01/15/2020                PLT                      299                 01/15/2020              Anesthesia Other Findings   Reproductive/Obstetrics                             Anesthesia Physical  Anesthesia  Plan  ASA: II  Anesthesia Plan: General   Post-op Pain Management:    Induction: Intravenous  PONV Risk Score and Plan: 3 and Ondansetron, Dexamethasone and Propofol infusion  Airway Management Planned: LMA  Additional Equipment: None  Intra-op Plan:   Post-operative Plan: Extubation in OR  Informed Consent: I have reviewed the patients History and Physical, chart, labs and discussed the procedure including the risks, benefits and alternatives for the proposed anesthesia with the patient or authorized representative who has indicated his/her understanding and acceptance.     Dental advisory given  Plan Discussed with: CRNA  Anesthesia Plan Comments:         Anesthesia Quick Evaluation

## 2020-04-18 NOTE — Anesthesia Procedure Notes (Signed)
Procedure Name: LMA Insertion Date/Time: 04/18/2020 10:05 AM Performed by: Georgeanne Nim, CRNA Pre-anesthesia Checklist: Patient identified, Emergency Drugs available, Suction available, Patient being monitored and Timeout performed Patient Re-evaluated:Patient Re-evaluated prior to induction Oxygen Delivery Method: Circle system utilized Preoxygenation: Pre-oxygenation with 100% oxygen Induction Type: IV induction Ventilation: Mask ventilation without difficulty LMA: LMA inserted LMA Size: 4.0 Number of attempts: 1 Placement Confirmation: positive ETCO2,  CO2 detector and breath sounds checked- equal and bilateral Tube secured with: Tape Dental Injury: Teeth and Oropharynx as per pre-operative assessment

## 2020-04-18 NOTE — H&P (View-Only) (Signed)
Radiation Oncology         [phone] ________________________________  Initial Outpatient Consultation  Name: Madeline Howard MRN: [id number]  Date: 03/20/2020  DOB: September 11, 1965     DIAGNOSIS: The primary encounter diagnosis was Malignant neoplasm of cervix, unspecified site [facility]). A diagnosis of Cancer of cervix [facility]) was also pertinent to this visit.   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 female who was diagnosed with stage IIb squamous cell carcinoma cervix.  She has recently completed her external beam radiation therapy and radiosensitizing chemotherapy.  She now presents to the operating room for her first brachytherapy procedure in preparation for high-dose-rate radiation therapy.   the patient presented to Dr. Addison Bailey on 12/28/2019 for evaluation of postmenopausal bleeding with cramping. An endometrial biopsy was performed at that time and revealed poorly differentiated carcinoma. A PAP smear was also performed and show highly dysplastic squamous cells c/w squamous cell carcinoma. 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   Given the above findings, the patient was referred to Dr. Berline Lopes and was seen in consultation on 01/12/2020. A cervical biopsy was taken at that time that showed invasive squamous cell carcinoma arising in the background of high-grade dysplasia. Endocervix curettage was also performed and showed high grade squamous intraepithelial lesion (CIN 3; severe dysplasia/carcinoma in situ).  The patient underwent a EUA, multiple cervical biopsies, ECC, and EMB on 01/17/2020. Final pathology revealed high grade squamous intraepithelial lesion, CIN-II/VAIN-11 (moderate dysplasia) of the vaginocervical junction at the 6 o'clock position and high grade intraepithelial lesion, CIN-III/VAIN III (severe dysplasia/CIS) at the 4 o'clock position. Invasive squamous cell carcinoma was also seen at the 12 o'clock position  of the vaginocervical junction. Endometrium curettage showed predominantly blood with fragments of high grade squamous intraepithelial lesion. Endocervix curettage showed invasive squamous cell carcinoma.  Exam by Dr. Berline Lopes was concerning for parametrial involvement.  CT scan of abdomen and pelvis on 01/26/2020 did not appreciate the known cervical malignancy. There was no evidence of lymphadenopathy or metastatic disease in the abdomen or pelvis. There was, however, a probable fibroid of the anterior uterine body that was not well delineated by CT.  The patient was seen by Dr. Alvy Bimler on 01/29/2020, during which time they discussed proceeding with concurrent chemoradiation therapy with weekly Cisplatin.  The patient's case was discussed at the Gynecologic Oncology Multi-Disciplinary Conference on 02/05/2020. It was recommended that she proceed with MRI followed by definitive treatment with radiation and chemotherapy or consideration for surgery.   MRI results: 6.5 cm infiltrative mass throughout the cervix, lower uterine segment, and uterine corpus, with mild hydrometros, consistent with known cervical carcinoma.  Suspected parametrial invasion along the right lateral surgical margin and the right anterior lower uterine segment. Possible involvement of posterior bladder wall and an adjacent small bowel loop. (FIGO stage IVA)  8 mm right common iliac lymph node. Lymph node metastasis cannot be excluded. PET-CT scan could be performed for further evaluation.  The patient was not felt to be a candidate for surgery given the MRI findings.  PREVIOUS RADIATION THERAPY: No  PAST MEDICAL HISTORY:  Past Medical History:  Diagnosis Date  . Allergy   . Cervical cancer ([place])   . 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 GERD (gastroesophageal reflux disease)   . Hypertension   . Wears glasses     PAST SURGICAL HISTORY: Past Surgical History:  Procedure Laterality Date   .  IR IMAGING GUIDED PORT INSERTION  02/13/2020    FAMILY HISTORY:  Family History  Problem Relation Age of Onset  . Cancer Mother        colon  . Diabetes Father   . Cancer Brother        melanoma  . Breast cancer Neg Hx   . Uterine cancer Neg Hx   . Ovarian cancer Neg Hx     SOCIAL HISTORY:  Social History   Tobacco Use  . Smoking status: Former Smoker    Packs/day: 1.50    Years: 15.00    Pack years: 22.50    Types: Cigarettes, E-cigarettes    Quit date: 07/15/2016    Years since quitting: 3.7  . Smokeless tobacco: Never Used  Vaping Use  . Vaping Use: Every day  . Start date: 07/15/2016  . Substances: Nicotine, Flavoring  Substance Use Topics  . Alcohol use: Never  . Drug use: Never    ALLERGIES: No Known Allergies  MEDICATIONS:  Current Facility-Administered Medications  Medication Dose Route Frequency Provider Last Rate Last Admin  . lactated ringers infusion   Intravenous Continuous Darral Dash, DO 50 mL/hr at 04/18/20 0815 New Bag at 04/18/20 0815  . povidone-iodine 10 % swab 2 application  2 application Topical Once Gery Pray, MD        REVIEW OF SYSTEMS:  A 10+ POINT REVIEW OF SYSTEMS WAS OBTAINED including neurology, dermatology, psychiatry, cardiac, respiratory, lymph, extremities, GI, GU, musculoskeletal, constitutional, reproductive, HEENT.  She denies any pelvic pain urination difficulties or bowel complaints.  She denies any vaginal bleeding.  In retrospect she did notice some mild postcoital bleeding over the past few months.   PHYSICAL EXAM:  height is 5\' 6"  (1.676 m) and weight is 158 lb (71.7 kg). Her oral temperature is 98 F (36.7 C). Her blood pressure is 142/74 (abnormal) and her pulse is 77. Her respiration is 15 and oxygen saturation is 98%.   General: Alert and oriented, in no acute distress HEENT: Head is normocephalic. Extraocular movements are intact. Oropharynx is clear. Neck: Neck is supple, no palpable cervical or  supraclavicular lymphadenopathy. Heart: Regular in rate and rhythm with no murmurs, rubs, or gallops. Chest: Clear to auscultation bilaterally, with no rhonchi, wheezes, or rales. Abdomen: Soft, nontender, nondistended, with no rigidity or guarding. Extremities: No cyanosis or edema. Lymphatics: see Neck Exam Skin: No concerning lesions. Musculoskeletal: symmetric strength and muscle tone throughout. Neurologic: Cranial nerves II through XII are grossly intact. No obvious focalities. Speech is fluent. Coordination is intact. Psychiatric: Judgment and insight are intact. Affect is appropriate. Pelvic exam: Pelvic exam to be performed under anesthesia  ECOG = 1  0 - Asymptomatic (Fully active, able to carry on all predisease activities without restriction)  1 - Symptomatic but completely ambulatory (Restricted in physically strenuous activity but ambulatory and able to carry out work of a light or sedentary nature. For example, light housework, office work)  2 - Symptomatic, <50% in bed during the day (Ambulatory and capable of all self care but unable to carry out any work activities. Up and about more than 50% of waking hours)  3 - Symptomatic, >50% in bed, but not bedbound (Capable of only limited self-care, confined to bed or chair 50% or more of waking hours)  4 - Bedbound (Completely disabled. Cannot carry on any self-care. Totally confined to bed or chair)  5 - Death   Eustace Pen MM, Creech RH, Tormey DC, et al. [phone]). "Toxicity  and response criteria of the [facility]". [place] Oncol. 5 (6): 649-55  LABORATORY DATA:  Lab Results  Component Value Date   WBC 3.8 (L) 04/18/2020   HGB 11.4 (L) 04/18/2020   HCT 34.7 (L) 04/18/2020   MCV 95.1 04/18/2020   PLT 221 04/18/2020   NEUTROABS 2.2 04/18/2020   Lab Results  Component Value Date   NA 137 04/03/2020   K 3.7 04/03/2020   CL 104 04/03/2020   CO2 25 04/03/2020   GLUCOSE 96 04/03/2020   CREATININE  0.62 04/03/2020   CALCIUM 8.8 (L) 04/03/2020      RADIOGRAPHY: No results found.    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  This will complete her definitive course of radiation therapy.  PLAN: The patient will be taken to the operating room on March 10 for exam under anesthesia.  Dilation of the cervical os and placement of tandem ring in preparation for high-dose-rate radiation therapy.  Iridium 192 will be the high-dose-rate source.  She is scheduled to receive 5 high-dose-rate treatments directed at the cervical region.   ------------------------------------------------  Blair Promise, PhD, MD

## 2020-04-18 NOTE — Progress Notes (Signed)
  Radiation Oncology         [phone] ________________________________  Name: Madeline Howard MRN: [id number]  Date: 04/18/2020  DOB: 07/08/1965  CC: Patient, No Pcp Per  [name], MD  HDR BRACHYTHERAPY NOTE  DIAGNOSIS: Stage IIb squamous cell carcinoma of the cervix  NARRATIVE: The patient was brought to the [place] suite. Identity was confirmed. All relevant records and images related to the planned course of therapy were reviewed. The patient freely provided informed written consent to proceed with treatment after reviewing the details related to the planned course of therapy. The consent form was witnessed and verified by the simulation staff. Then, the patient was set-up in a stable reproducible supine position for radiation therapy. The tandem ring system was accessed and fiducial markers were placed within the tandem and ring.   Simple treatment device note: On the operating room the patient had construction of her custom tandem ring system. She will be treated with a 45 tandem/ring system. The patient had placement of a 60 mm tandem. A cervical ring with a small shielding was used for her treatment. A rectal paddle was also part of her custom set up device.  Verification simulation note: An AP and lateral film was obtained through the pelvis area. This was compared to the patient's planning films documenting accurate position of the tandem/ring system for treatment.  High-dose-rate brachytherapy treatment note:  The remote afterloading device was accessed through catheter system and attached to the tandem ring system. Patient then proceeded to undergo her first high-dose-rate treatment directed at the cervix. The patient was prescribed a dose of 5.5 gray to be delivered to the high risk clinical target volume.. Patient was treated with 2 channels using 27 dwell positions. Treatment time was 577.9 seconds. The patient tolerated the procedure well. After completion of her  therapy, a radiation survey was performed documenting return of the iridium source into the [employer] safe. The patient was then transferred to the nursing suite. She then had removal of the rectal paddle followed by the tandem and ring system. The patient tolerated the removal well.  The patient tolerated the overall procedure well.  She only required 0.5 mg of Dilaudid IV for pain issues.  PLAN: Patient will return next week for her second high-dose-rate treatment directed at the cervix region ________________________________  Blair Promise, PhD, MD

## 2020-04-18 NOTE — Op Note (Signed)
04/18/2020  11:32 AM  PATIENT:  Madeline Howard  55 y.o. female  PRE-OPERATIVE DIAGNOSIS:  ENDOCERVIX CANCER  POST-OPERATIVE DIAGNOSIS:  ENDOCERVIX CANCER  PROCEDURE:  Procedure(s): TANDEM RING INSERTION (N/A) OPERATIVE ULTRASOUND (N/A)  SURGEON:  Surgeon(s) and Role:    * Gery Pray, MD - Primary  PHYSICIAN ASSISTANT:   ASSISTANTS: none   ANESTHESIA:   general  EBL:  5 mL   BLOOD ADMINISTERED:none  DRAINS: Urinary Catheter (Foley)   LOCAL MEDICATIONS USED:  NONE  SPECIMEN:  No Specimen  DISPOSITION OF SPECIMEN:  N/A  COUNTS:  YES  TOURNIQUET:  * No tourniquets in log *  DICTATION: The patient was taken to outpatient OR #4.  Timeout was performed for the procedure including allergies, preoperative antibiotics, pain medications and procedure.  The patient was prepped and draped in the usual sterile fashion and placed in the dorsolithotomy position.  A Foley catheter was placed without difficulty.  To improve ultrasound imaging the bladder was backfilled with approximately 300 cc of sterile water.  Patient then proceeded to undergo a pelvic exam under anesthesia.  The external genitalia was unremarkable.  No mucosal lesions noted in the vaginal vault.  The cervical os was identified and some erythema to the mucosa was noted consistent with radiation effect.  On bimanual examination the vaginal fornices were present.  The cervical mass had decreased significantly in size.  Pretreatment measurements per imaging and exam was approximately 6.5 cm with right parametrial extension.  Exam today reveals the cervical region to measure approximately 3 x 3 cm in size.  No obvious parametrial extension.  Rectovaginal examination the findings were confirmed.  The patient then underwent dilation and sounding of the uterus.  The uterus was estimated to be approximately 7 to 8 cm on sounding.  The cervical os was serially dilated.  Ultrasound imaging was excellent.  The uterus was noted to be  in a mildly anteverted position.  After dilation of the cervix a 60 mm cervical sleeve was placed within the endometrial cavity and endocervical canal.  This was then followed by placement of a 60 mm tandem.  Attached to this tandem was the cervical ring with a small shielding cap in place. She then had placement of a rectal paddle posteriorly to limit high-dose radiation therapy to the rectal mucosa.  The patient tolerated the procedure well.  She was subsequently taken to the rocovery room in stable condition.  Later in the day the patient will be transported to the radiation oncology department for CT simulation with tandem and ring in place as well as planning for high-dose-rate radiation therapy and her first high-dose-rate treatment.  Plan is for the patient to receive 5.5 Gy to thel high risk clinical target volume (HRCTV).  Iridium 192 will be the high-dose-rate source.  She is scheduled to receive a total of 5 high-dose-rate treatments which will to complete her definitive course of therapy.  PLAN OF CARE: Transferred to radiation oncology for planning and treatment  PATIENT DISPOSITION:  PACU - hemodynamically stable.   Delay start of Pharmacological VTE agent (>24hrs) due to surgical blood loss or risk of bleeding: not applicable

## 2020-04-18 NOTE — Interval H&P Note (Signed)
History and Physical Interval Note:  04/18/2020 9:52 AM  Madeline Howard  has presented today for surgery, with the diagnosis of ENDOCERVIX.  The various methods of treatment have been discussed with the patient and family. After consideration of risks, benefits and other options for treatment, the patient has consented to  Procedure(s): TANDEM RING INSERTION (N/A) OPERATIVE ULTRASOUND (N/A) as a surgical intervention.  The patient's history has been reviewed, patient examined, no change in status, stable for surgery.  I have reviewed the patient's chart and labs.  Questions were answered to the patient's satisfaction.     Gery Pray

## 2020-04-18 NOTE — H&P (Signed)
Radiation Oncology         [phone] ________________________________  Initial Outpatient Consultation  Name: Madeline Howard MRN: [id number]  Date: 03/20/2020  DOB: Aug 07, 1965     DIAGNOSIS: The primary encounter diagnosis was Malignant neoplasm of cervix, unspecified site [facility]). A diagnosis of Cancer of cervix [facility]) was also pertinent to this visit.   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 female who was diagnosed with stage IIb squamous cell carcinoma cervix.  She has recently completed her external beam radiation therapy and radiosensitizing chemotherapy.  She now presents to the operating room for her first brachytherapy procedure in preparation for high-dose-rate radiation therapy.   the patient presented to Dr. Addison Bailey on 12/28/2019 for evaluation of postmenopausal bleeding with cramping. An endometrial biopsy was performed at that time and revealed poorly differentiated carcinoma. A PAP smear was also performed and show highly dysplastic squamous cells c/w squamous cell carcinoma. 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   Given the above findings, the patient was referred to Dr. Berline Lopes and was seen in consultation on 01/12/2020. A cervical biopsy was taken at that time that showed invasive squamous cell carcinoma arising in the background of high-grade dysplasia. Endocervix curettage was also performed and showed high grade squamous intraepithelial lesion (CIN 3; severe dysplasia/carcinoma in situ).  The patient underwent a EUA, multiple cervical biopsies, ECC, and EMB on 01/17/2020. Final pathology revealed high grade squamous intraepithelial lesion, CIN-II/VAIN-11 (moderate dysplasia) of the vaginocervical junction at the 6 o'clock position and high grade intraepithelial lesion, CIN-III/VAIN III (severe dysplasia/CIS) at the 4 o'clock position. Invasive squamous cell carcinoma was also seen at the 12 o'clock position  of the vaginocervical junction. Endometrium curettage showed predominantly blood with fragments of high grade squamous intraepithelial lesion. Endocervix curettage showed invasive squamous cell carcinoma.  Exam by Dr. Berline Lopes was concerning for parametrial involvement.  CT scan of abdomen and pelvis on 01/26/2020 did not appreciate the known cervical malignancy. There was no evidence of lymphadenopathy or metastatic disease in the abdomen or pelvis. There was, however, a probable fibroid of the anterior uterine body that was not well delineated by CT.  The patient was seen by Dr. Alvy Bimler on 01/29/2020, during which time they discussed proceeding with concurrent chemoradiation therapy with weekly Cisplatin.  The patient's case was discussed at the Gynecologic Oncology Multi-Disciplinary Conference on 02/05/2020. It was recommended that she proceed with MRI followed by definitive treatment with radiation and chemotherapy or consideration for surgery.   MRI results: 6.5 cm infiltrative mass throughout the cervix, lower uterine segment, and uterine corpus, with mild hydrometros, consistent with known cervical carcinoma.  Suspected parametrial invasion along the right lateral surgical margin and the right anterior lower uterine segment. Possible involvement of posterior bladder wall and an adjacent small bowel loop. (FIGO stage IVA)  8 mm right common iliac lymph node. Lymph node metastasis cannot be excluded. PET-CT scan could be performed for further evaluation.  The patient was not felt to be a candidate for surgery given the MRI findings.  PREVIOUS RADIATION THERAPY: No  PAST MEDICAL HISTORY:  Past Medical History:  Diagnosis Date  . Allergy   . Cervical cancer ([place])   . 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 GERD (gastroesophageal reflux disease)   . Hypertension   . Wears glasses     PAST SURGICAL HISTORY: Past Surgical History:  Procedure Laterality Date   .  IR IMAGING GUIDED PORT INSERTION  02/13/2020    FAMILY HISTORY:  Family History  Problem Relation Age of Onset  . Cancer Mother        colon  . Diabetes Father   . Cancer Brother        melanoma  . Breast cancer Neg Hx   . Uterine cancer Neg Hx   . Ovarian cancer Neg Hx     SOCIAL HISTORY:  Social History   Tobacco Use  . Smoking status: Former Smoker    Packs/day: 1.50    Years: 15.00    Pack years: 22.50    Types: Cigarettes, E-cigarettes    Quit date: 07/15/2016    Years since quitting: 3.7  . Smokeless tobacco: Never Used  Vaping Use  . Vaping Use: Every day  . Start date: 07/15/2016  . Substances: Nicotine, Flavoring  Substance Use Topics  . Alcohol use: Never  . Drug use: Never    ALLERGIES: No Known Allergies  MEDICATIONS:  Current Facility-Administered Medications  Medication Dose Route Frequency Provider Last Rate Last Admin  . lactated ringers infusion   Intravenous Continuous Darral Dash, DO 50 mL/hr at 04/18/20 0815 New Bag at 04/18/20 0815  . povidone-iodine 10 % swab 2 application  2 application Topical Once Gery Pray, MD        REVIEW OF SYSTEMS:  A 10+ POINT REVIEW OF SYSTEMS WAS OBTAINED including neurology, dermatology, psychiatry, cardiac, respiratory, lymph, extremities, GI, GU, musculoskeletal, constitutional, reproductive, HEENT.  She denies any pelvic pain urination difficulties or bowel complaints.  She denies any vaginal bleeding.  In retrospect she did notice some mild postcoital bleeding over the past few months.   PHYSICAL EXAM:  height is 5\' 6"  (1.676 m) and weight is 158 lb (71.7 kg). Her oral temperature is 98 F (36.7 C). Her blood pressure is 142/74 (abnormal) and her pulse is 77. Her respiration is 15 and oxygen saturation is 98%.   General: Alert and oriented, in no acute distress HEENT: Head is normocephalic. Extraocular movements are intact. Oropharynx is clear. Neck: Neck is supple, no palpable cervical or  supraclavicular lymphadenopathy. Heart: Regular in rate and rhythm with no murmurs, rubs, or gallops. Chest: Clear to auscultation bilaterally, with no rhonchi, wheezes, or rales. Abdomen: Soft, nontender, nondistended, with no rigidity or guarding. Extremities: No cyanosis or edema. Lymphatics: see Neck Exam Skin: No concerning lesions. Musculoskeletal: symmetric strength and muscle tone throughout. Neurologic: Cranial nerves II through XII are grossly intact. No obvious focalities. Speech is fluent. Coordination is intact. Psychiatric: Judgment and insight are intact. Affect is appropriate. Pelvic exam: Pelvic exam to be performed under anesthesia  ECOG = 1  0 - Asymptomatic (Fully active, able to carry on all predisease activities without restriction)  1 - Symptomatic but completely ambulatory (Restricted in physically strenuous activity but ambulatory and able to carry out work of a light or sedentary nature. For example, light housework, office work)  2 - Symptomatic, <50% in bed during the day (Ambulatory and capable of all self care but unable to carry out any work activities. Up and about more than 50% of waking hours)  3 - Symptomatic, >50% in bed, but not bedbound (Capable of only limited self-care, confined to bed or chair 50% or more of waking hours)  4 - Bedbound (Completely disabled. Cannot carry on any self-care. Totally confined to bed or chair)  5 - Death   Eustace Pen MM, Creech RH, Tormey DC, et al. [phone]). "Toxicity  and response criteria of the [facility]". [place] Oncol. 5 (6): 649-55  LABORATORY DATA:  Lab Results  Component Value Date   WBC 3.8 (L) 04/18/2020   HGB 11.4 (L) 04/18/2020   HCT 34.7 (L) 04/18/2020   MCV 95.1 04/18/2020   PLT 221 04/18/2020   NEUTROABS 2.2 04/18/2020   Lab Results  Component Value Date   NA 137 04/03/2020   K 3.7 04/03/2020   CL 104 04/03/2020   CO2 25 04/03/2020   GLUCOSE 96 04/03/2020   CREATININE  0.62 04/03/2020   CALCIUM 8.8 (L) 04/03/2020      RADIOGRAPHY: No results found.    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  This will complete her definitive course of radiation therapy.  PLAN: The patient will be taken to the operating room on March 10 for exam under anesthesia.  Dilation of the cervical os and placement of tandem ring in preparation for high-dose-rate radiation therapy.  Iridium 192 will be the high-dose-rate source.  She is scheduled to receive 5 high-dose-rate treatments directed at the cervical region.   ------------------------------------------------  Blair Promise, PhD, MD

## 2020-04-18 NOTE — Transfer of Care (Signed)
Immediate Anesthesia Transfer of Care Note  Patient: Madeline Howard  Procedure(s) Performed: TANDEM RING INSERTION (N/A Vagina ) OPERATIVE ULTRASOUND (N/A Abdomen)  Patient Location: PACU  Anesthesia Type:General  Level of Consciousness: awake, alert , oriented and patient cooperative  Airway & Oxygen Therapy: Patient Spontanous Breathing  Post-op Assessment: Report given to RN and Post -op Vital signs reviewed and stable  Post vital signs: Reviewed and stable  Last Vitals:  Vitals Value Taken Time  BP    Temp    Pulse    Resp    SpO2      Last Pain:  Vitals:   04/18/20 0812  TempSrc: Oral  PainSc: 0-No pain      Patients Stated Pain Goal: 4 (72/18/28 8337)  Complications: No complications documented.

## 2020-04-18 NOTE — H&P (View-Only) (Signed)
Radiation Oncology         [phone] ________________________________  Initial Outpatient Consultation  Name: Madeline Howard MRN: [id number]  Date: 03/20/2020  DOB: 17-Aug-1965     DIAGNOSIS: The primary encounter diagnosis was Malignant neoplasm of cervix, unspecified site [facility]). A diagnosis of Cancer of cervix [facility]) was also pertinent to this visit.   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 female who was diagnosed with stage IIb squamous cell carcinoma cervix.  She has recently completed her external beam radiation therapy and radiosensitizing chemotherapy.  She now presents to the operating room for her first brachytherapy procedure in preparation for high-dose-rate radiation therapy.   the patient presented to Dr. Addison Bailey on 12/28/2019 for evaluation of postmenopausal bleeding with cramping. An endometrial biopsy was performed at that time and revealed poorly differentiated carcinoma. A PAP smear was also performed and show highly dysplastic squamous cells c/w squamous cell carcinoma. 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   Given the above findings, the patient was referred to Dr. Berline Lopes and was seen in consultation on 01/12/2020. A cervical biopsy was taken at that time that showed invasive squamous cell carcinoma arising in the background of high-grade dysplasia. Endocervix curettage was also performed and showed high grade squamous intraepithelial lesion (CIN 3; severe dysplasia/carcinoma in situ).  The patient underwent a EUA, multiple cervical biopsies, ECC, and EMB on 01/17/2020. Final pathology revealed high grade squamous intraepithelial lesion, CIN-II/VAIN-11 (moderate dysplasia) of the vaginocervical junction at the 6 o'clock position and high grade intraepithelial lesion, CIN-III/VAIN III (severe dysplasia/CIS) at the 4 o'clock position. Invasive squamous cell carcinoma was also seen at the 12 o'clock position  of the vaginocervical junction. Endometrium curettage showed predominantly blood with fragments of high grade squamous intraepithelial lesion. Endocervix curettage showed invasive squamous cell carcinoma.  Exam by Dr. Berline Lopes was concerning for parametrial involvement.  CT scan of abdomen and pelvis on 01/26/2020 did not appreciate the known cervical malignancy. There was no evidence of lymphadenopathy or metastatic disease in the abdomen or pelvis. There was, however, a probable fibroid of the anterior uterine body that was not well delineated by CT.  The patient was seen by Dr. Alvy Bimler on 01/29/2020, during which time they discussed proceeding with concurrent chemoradiation therapy with weekly Cisplatin.  The patient's case was discussed at the Gynecologic Oncology Multi-Disciplinary Conference on 02/05/2020. It was recommended that she proceed with MRI followed by definitive treatment with radiation and chemotherapy or consideration for surgery.   MRI results: 6.5 cm infiltrative mass throughout the cervix, lower uterine segment, and uterine corpus, with mild hydrometros, consistent with known cervical carcinoma.  Suspected parametrial invasion along the right lateral surgical margin and the right anterior lower uterine segment. Possible involvement of posterior bladder wall and an adjacent small bowel loop. (FIGO stage IVA)  8 mm right common iliac lymph node. Lymph node metastasis cannot be excluded. PET-CT scan could be performed for further evaluation.  The patient was not felt to be a candidate for surgery given the MRI findings.  PREVIOUS RADIATION THERAPY: No  PAST MEDICAL HISTORY:  Past Medical History:  Diagnosis Date  . Allergy   . Cervical cancer ([place])   . 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 GERD (gastroesophageal reflux disease)   . Hypertension   . Wears glasses     PAST SURGICAL HISTORY: Past Surgical History:  Procedure Laterality Date   .  IR IMAGING GUIDED PORT INSERTION  02/13/2020    FAMILY HISTORY:  Family History  Problem Relation Age of Onset  . Cancer Mother        colon  . Diabetes Father   . Cancer Brother        melanoma  . Breast cancer Neg Hx   . Uterine cancer Neg Hx   . Ovarian cancer Neg Hx     SOCIAL HISTORY:  Social History   Tobacco Use  . Smoking status: Former Smoker    Packs/day: 1.50    Years: 15.00    Pack years: 22.50    Types: Cigarettes, E-cigarettes    Quit date: 07/15/2016    Years since quitting: 3.7  . Smokeless tobacco: Never Used  Vaping Use  . Vaping Use: Every day  . Start date: 07/15/2016  . Substances: Nicotine, Flavoring  Substance Use Topics  . Alcohol use: Never  . Drug use: Never    ALLERGIES: No Known Allergies  MEDICATIONS:  Current Facility-Administered Medications  Medication Dose Route Frequency Provider Last Rate Last Admin  . lactated ringers infusion   Intravenous Continuous Darral Dash, DO 50 mL/hr at 04/18/20 0815 New Bag at 04/18/20 0815  . povidone-iodine 10 % swab 2 application  2 application Topical Once Gery Pray, MD        REVIEW OF SYSTEMS:  A 10+ POINT REVIEW OF SYSTEMS WAS OBTAINED including neurology, dermatology, psychiatry, cardiac, respiratory, lymph, extremities, GI, GU, musculoskeletal, constitutional, reproductive, HEENT.  She denies any pelvic pain urination difficulties or bowel complaints.  She denies any vaginal bleeding.  In retrospect she did notice some mild postcoital bleeding over the past few months.   PHYSICAL EXAM:  height is 5\' 6"  (1.676 m) and weight is 158 lb (71.7 kg). Her oral temperature is 98 F (36.7 C). Her blood pressure is 142/74 (abnormal) and her pulse is 77. Her respiration is 15 and oxygen saturation is 98%.   General: Alert and oriented, in no acute distress HEENT: Head is normocephalic. Extraocular movements are intact. Oropharynx is clear. Neck: Neck is supple, no palpable cervical or  supraclavicular lymphadenopathy. Heart: Regular in rate and rhythm with no murmurs, rubs, or gallops. Chest: Clear to auscultation bilaterally, with no rhonchi, wheezes, or rales. Abdomen: Soft, nontender, nondistended, with no rigidity or guarding. Extremities: No cyanosis or edema. Lymphatics: see Neck Exam Skin: No concerning lesions. Musculoskeletal: symmetric strength and muscle tone throughout. Neurologic: Cranial nerves II through XII are grossly intact. No obvious focalities. Speech is fluent. Coordination is intact. Psychiatric: Judgment and insight are intact. Affect is appropriate. Pelvic exam: Pelvic exam to be performed under anesthesia  ECOG = 1  0 - Asymptomatic (Fully active, able to carry on all predisease activities without restriction)  1 - Symptomatic but completely ambulatory (Restricted in physically strenuous activity but ambulatory and able to carry out work of a light or sedentary nature. For example, light housework, office work)  2 - Symptomatic, <50% in bed during the day (Ambulatory and capable of all self care but unable to carry out any work activities. Up and about more than 50% of waking hours)  3 - Symptomatic, >50% in bed, but not bedbound (Capable of only limited self-care, confined to bed or chair 50% or more of waking hours)  4 - Bedbound (Completely disabled. Cannot carry on any self-care. Totally confined to bed or chair)  5 - Death   Eustace Pen MM, Creech RH, Tormey DC, et al. [phone]). "Toxicity  and response criteria of the [facility]". [place] Oncol. 5 (6): 649-55  LABORATORY DATA:  Lab Results  Component Value Date   WBC 3.8 (L) 04/18/2020   HGB 11.4 (L) 04/18/2020   HCT 34.7 (L) 04/18/2020   MCV 95.1 04/18/2020   PLT 221 04/18/2020   NEUTROABS 2.2 04/18/2020   Lab Results  Component Value Date   NA 137 04/03/2020   K 3.7 04/03/2020   CL 104 04/03/2020   CO2 25 04/03/2020   GLUCOSE 96 04/03/2020   CREATININE  0.62 04/03/2020   CALCIUM 8.8 (L) 04/03/2020      RADIOGRAPHY: No results found.    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  This will complete her definitive course of radiation therapy.  PLAN: The patient will be taken to the operating room on March 10 for exam under anesthesia.  Dilation of the cervical os and placement of tandem ring in preparation for high-dose-rate radiation therapy.  Iridium 192 will be the high-dose-rate source.  She is scheduled to receive 5 high-dose-rate treatments directed at the cervical region.   ------------------------------------------------  Blair Promise, PhD, MD

## 2020-04-19 ENCOUNTER — Encounter (HOSPITAL_BASED_OUTPATIENT_CLINIC_OR_DEPARTMENT_OTHER): Payer: Self-pay | Admitting: Radiation Oncology

## 2020-04-19 ENCOUNTER — Other Ambulatory Visit: Payer: Self-pay

## 2020-04-19 NOTE — Progress Notes (Signed)
Spoke w/ via phone for pre-op interview--- PT Lab needs dos---- no (per anes)/  Pre-op orders pending              Lab results------ pt had CBCdiff, BMP, negative UPT, and EKG done 04-18-2020 results in epic COVID test ------ 04-20-2020 @ 0915 Arrive at ------- 0845 on 04-22-2020 NPO after MN NO Solid Food.  Clear liquids from MN until--- 0745 Med rec completed Medications to take morning of surgery -----Pepcid Diabetic medication ----- n/a Patient instructed to bring photo id and insurance card day of surgery Patient aware to have Driver (ride ) / caregiver    for 24 hours after surgery -- daughter, Ardelle Balls  Patient Special Instructions ----- n/a Pre-Op special Istructions ----- pre-op orders pending, called and spoke w/ shirley at dr Sondra Come office requested orders Patient verbalized understanding of instructions that were given at this phone interview. Patient denies shortness of breath, chest pain, fever, cough at this phone interview.

## 2020-04-19 NOTE — Anesthesia Postprocedure Evaluation (Signed)
Anesthesia Post Note  Patient: Madeline Howard  Procedure(s) Performed: TANDEM RING INSERTION (N/A Vagina ) OPERATIVE ULTRASOUND (N/A Abdomen)     Patient location during evaluation: PACU Anesthesia Type: General Level of consciousness: sedated and patient cooperative Pain management: pain level controlled Vital Signs Assessment: post-procedure vital signs reviewed and stable Respiratory status: spontaneous breathing Cardiovascular status: stable Anesthetic complications: no   No complications documented.  Last Vitals:  Vitals:   04/18/20 1157 04/18/20 1200  BP:  (!) 143/71  Pulse: 69 64  Resp: 16 (!) 22  Temp:    SpO2: 95% 98%    Last Pain:  Vitals:   04/18/20 1050  TempSrc: Oral  PainSc:                  Nolon Nations

## 2020-04-20 ENCOUNTER — Other Ambulatory Visit (HOSPITAL_COMMUNITY)
Admission: RE | Admit: 2020-04-20 | Discharge: 2020-04-20 | Disposition: A | Discharge status: Home / Self Care | Payer: Medicaid Other | Source: Ambulatory Visit | Attending: Radiation Oncology | Admitting: Radiation Oncology

## 2020-04-20 DIAGNOSIS — Z20822 Contact with and (suspected) exposure to covid-19: Secondary | ICD-10-CM | POA: Diagnosis not present

## 2020-04-20 DIAGNOSIS — Z01812 Encounter for preprocedural laboratory examination: Secondary | ICD-10-CM | POA: Insufficient documentation

## 2020-04-20 LAB — SARS CORONAVIRUS 2 (TAT 6-24 HRS): SARS Coronavirus 2: NEGATIVE

## 2020-04-21 NOTE — Progress Notes (Signed)
  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 Identity was confirmed. All relevant records and images related to the planned course of therapy were reviewed. The patient freely provided informed written consent to proceed with treatment after reviewing the details related to the planned course of therapy. The consent form was witnessed and verified by the simulation staff. Then, the patient was set-up in a stable reproducible supine position for radiation therapy. The tandem ring system was accessed and fiducial markers were placed within the tandem and ring.   Simple treatment device note: On the operating room the patient had construction of her custom tandem ring system. She will be treated with a 60 tandem/ring system. The patient had placement of a 60 mm tandem. A cervical ring with a small shielding was used for her treatment. A rectal paddle was also part of her custom set up device.  Verification simulation note: An AP and lateral film was obtained through the pelvis area. This was compared to the patient's planning films documenting accurate position of the tandem/ring system for treatment.  High-dose-rate brachytherapy treatment note:  The remote afterloading device was accessed through catheter system and attached to the tandem ring system. Patient then proceeded to undergo her second high-dose-rate treatment directed at the cervix. The patient was prescribed a dose of 5.5 gray to be delivered to the high risk clinical target volume. Patient was treated with 2 channels using 25 dwell positions. Treatment time was 441.8 seconds. The patient tolerated the procedure well. After completion of her  therapy, a radiation survey was performed documenting return of the iridium source into the GammaMed safe. The patient was then transferred to the nursing suite. She then had removal of the rectal paddle followed by the tandem and ring system. The patient tolerated the removal well.  The patient tolerated the overall procedure well.  She only required 0.5 mg of Dilaudid IV for pain issues.  PLAN: Patient will return next week for her third high-dose-rate treatment directed at the cervix region. ________________________________    Blair Promise, PhD, MD  This document serves as a record of services personally performed by Gery Pray, MD. It was created on his behalf by Clerance Lav, a trained medical scribe. The creation of this record is based on the scribe's personal observations and the provider's statements to them. This document has been checked and approved by the attending provider.

## 2020-04-22 ENCOUNTER — Encounter (HOSPITAL_BASED_OUTPATIENT_CLINIC_OR_DEPARTMENT_OTHER)
Admission: RE | Disposition: A | Discharge status: Home / Self Care | Payer: Self-pay | Source: Home / Self Care | Attending: Radiation Oncology

## 2020-04-22 ENCOUNTER — Ambulatory Visit (HOSPITAL_BASED_OUTPATIENT_CLINIC_OR_DEPARTMENT_OTHER): Payer: Medicaid Other | Admitting: Anesthesiology

## 2020-04-22 ENCOUNTER — Other Ambulatory Visit: Payer: Self-pay

## 2020-04-22 ENCOUNTER — Ambulatory Visit
Admission: RE | Admit: 2020-04-22 | Discharge: 2020-04-22 | Disposition: A | Discharge status: Home / Self Care | Payer: Medicaid Other | Source: Ambulatory Visit | Attending: Radiation Oncology | Admitting: Radiation Oncology

## 2020-04-22 ENCOUNTER — Encounter (HOSPITAL_BASED_OUTPATIENT_CLINIC_OR_DEPARTMENT_OTHER): Payer: Self-pay | Admitting: Radiation Oncology

## 2020-04-22 ENCOUNTER — Ambulatory Visit (HOSPITAL_COMMUNITY)
Admission: RE | Admit: 2020-04-22 | Discharge: 2020-04-22 | Disposition: A | Discharge status: Home / Self Care | Payer: Medicaid Other | Source: Ambulatory Visit | Attending: Radiation Oncology | Admitting: Radiation Oncology

## 2020-04-22 ENCOUNTER — Ambulatory Visit (HOSPITAL_BASED_OUTPATIENT_CLINIC_OR_DEPARTMENT_OTHER)
Admission: RE | Admit: 2020-04-22 | Discharge: 2020-04-22 | Disposition: A | Discharge status: Home / Self Care | Payer: Medicaid Other | Attending: Radiation Oncology | Admitting: Radiation Oncology

## 2020-04-22 DIAGNOSIS — Z8616 Personal history of COVID-19: Secondary | ICD-10-CM | POA: Insufficient documentation

## 2020-04-22 DIAGNOSIS — Z9221 Personal history of antineoplastic chemotherapy: Secondary | ICD-10-CM | POA: Diagnosis not present

## 2020-04-22 DIAGNOSIS — Z79899 Other long term (current) drug therapy: Secondary | ICD-10-CM | POA: Insufficient documentation

## 2020-04-22 DIAGNOSIS — C539 Malignant neoplasm of cervix uteri, unspecified: Secondary | ICD-10-CM

## 2020-04-22 DIAGNOSIS — Z87891 Personal history of nicotine dependence: Secondary | ICD-10-CM | POA: Insufficient documentation

## 2020-04-22 DIAGNOSIS — Z923 Personal history of irradiation: Secondary | ICD-10-CM | POA: Insufficient documentation

## 2020-04-22 DIAGNOSIS — C530 Malignant neoplasm of endocervix: Secondary | ICD-10-CM

## 2020-04-22 HISTORY — PX: OPERATIVE ULTRASOUND: SHX5996

## 2020-04-22 HISTORY — PX: TANDEM RING INSERTION: SHX6199

## 2020-04-22 SURGERY — INSERTION, UTERINE TANDEM AND RING OR CYLINDER, FOR BRACHYTHERAPY
Anesthesia: General | Site: Cervix

## 2020-04-22 MED ORDER — AMISULPRIDE (ANTIEMETIC) 5 MG/2ML IV SOLN: 10.0000 mg | Freq: Once | INTRAVENOUS | Status: DC | PRN

## 2020-04-22 MED ORDER — EPHEDRINE 5 MG/ML INJ
INTRAVENOUS | Status: AC
  Filled 2020-04-22: qty 10

## 2020-04-22 MED ORDER — PROPOFOL 10 MG/ML IV BOLUS
INTRAVENOUS | Status: AC
  Filled 2020-04-22: qty 40

## 2020-04-22 MED ORDER — MIDAZOLAM HCL 2 MG/2ML IJ SOLN
INTRAMUSCULAR | Status: AC
  Filled 2020-04-22: qty 2

## 2020-04-22 MED ORDER — FENTANYL CITRATE (PF) 100 MCG/2ML IJ SOLN
INTRAMUSCULAR | Status: DC | PRN
  Administered 2020-04-22 (×2): 50 ug via INTRAVENOUS

## 2020-04-22 MED ORDER — HYDROMORPHONE HCL 1 MG/ML IJ SOLN
0.5000 mg | Freq: Once | INTRAMUSCULAR | Status: AC
Start: 2020-04-22 — End: 2020-04-22
  Administered 2020-04-22: 0.5 mg via INTRAVENOUS
  Filled 2020-04-22: qty 1

## 2020-04-22 MED ORDER — FENTANYL CITRATE (PF) 100 MCG/2ML IJ SOLN: 25.0000 ug | INTRAMUSCULAR | Status: DC | PRN

## 2020-04-22 MED ORDER — GLYCOPYRROLATE PF 0.2 MG/ML IJ SOSY
PREFILLED_SYRINGE | INTRAMUSCULAR | Status: AC
  Filled 2020-04-22: qty 1

## 2020-04-22 MED ORDER — MIDAZOLAM HCL 2 MG/2ML IJ SOLN
INTRAMUSCULAR | Status: DC | PRN
  Administered 2020-04-22: 1 mg via INTRAVENOUS

## 2020-04-22 MED ORDER — ONDANSETRON HCL 4 MG/2ML IJ SOLN
INTRAMUSCULAR | Status: DC | PRN
  Administered 2020-04-22: 4 mg via INTRAVENOUS

## 2020-04-22 MED ORDER — ONDANSETRON HCL 4 MG/2ML IJ SOLN
INTRAMUSCULAR | Status: AC
  Filled 2020-04-22: qty 2

## 2020-04-22 MED ORDER — LIDOCAINE HCL (CARDIAC) PF 100 MG/5ML IV SOSY
PREFILLED_SYRINGE | INTRAVENOUS | Status: DC | PRN
  Administered 2020-04-22: 60 mg via INTRAVENOUS

## 2020-04-22 MED ORDER — DEXAMETHASONE SODIUM PHOSPHATE 4 MG/ML IJ SOLN
INTRAMUSCULAR | Status: DC | PRN
  Administered 2020-04-22: 8 mg via INTRAVENOUS

## 2020-04-22 MED ORDER — ONDANSETRON HCL 4 MG/2ML IJ SOLN: 4.0000 mg | Freq: Once | INTRAMUSCULAR | Status: DC | PRN

## 2020-04-22 MED ORDER — FENTANYL CITRATE (PF) 100 MCG/2ML IJ SOLN
INTRAMUSCULAR | Status: AC
  Filled 2020-04-22: qty 2

## 2020-04-22 MED ORDER — PROPOFOL 10 MG/ML IV BOLUS
INTRAVENOUS | Status: DC | PRN
  Administered 2020-04-22: 150 mg via INTRAVENOUS

## 2020-04-22 MED ORDER — KETOROLAC TROMETHAMINE 30 MG/ML IJ SOLN
INTRAMUSCULAR | Status: AC
  Filled 2020-04-22: qty 1

## 2020-04-22 MED ORDER — OXYCODONE HCL 5 MG/5ML PO SOLN
5.0000 mg | Freq: Once | ORAL | Status: DC | PRN
Start: 2020-04-22 — End: 2020-04-24

## 2020-04-22 MED ORDER — POVIDONE-IODINE 10 % EX SWAB: 2.0000 "application " | Freq: Once | CUTANEOUS | Status: DC

## 2020-04-22 MED ORDER — OXYCODONE HCL 5 MG PO TABS
5.0000 mg | ORAL_TABLET | Freq: Once | ORAL | Status: DC | PRN
Start: 2020-04-22 — End: 2020-04-24

## 2020-04-22 MED ORDER — KETOROLAC TROMETHAMINE 30 MG/ML IJ SOLN
INTRAMUSCULAR | Status: DC | PRN
  Administered 2020-04-22: 30 mg via INTRAVENOUS

## 2020-04-22 MED ORDER — LACTATED RINGERS IV SOLN: INTRAVENOUS | Status: DC

## 2020-04-22 SURGICAL SUPPLY — 19 items
BNDG CONFORM 2 STRL LF (GAUZE/BANDAGES/DRESSINGS) IMPLANT
COVER WAND RF STERILE (DRAPES) ×3 IMPLANT
DILATOR CANAL MILEX (MISCELLANEOUS) IMPLANT
DRSG PAD ABDOMINAL 8X10 ST (GAUZE/BANDAGES/DRESSINGS) ×3 IMPLANT
GAUZE 4X4 16PLY RFD (DISPOSABLE) ×3 IMPLANT
GLOVE SURG ENC MOIS LTX SZ7.5 (GLOVE) ×6 IMPLANT
GOWN STRL REUS W/TWL LRG LVL3 (GOWN DISPOSABLE) ×3 IMPLANT
HOLDER FOLEY CATH W/STRAP (MISCELLANEOUS) ×3 IMPLANT
IV NS 1000ML (IV SOLUTION) ×1
IV NS 1000ML BAXH (IV SOLUTION) ×2 IMPLANT
IV SET EXTENSION GRAVITY 40 LF (IV SETS) ×3 IMPLANT
KIT TURNOVER CYSTO (KITS) ×3 IMPLANT
MAT PREVALON FULL STRYKER (MISCELLANEOUS) ×3 IMPLANT
PACK VAGINAL MINOR WOMEN LF (CUSTOM PROCEDURE TRAY) ×3 IMPLANT
PACKING VAGINAL (PACKING) IMPLANT
PAD OB MATERNITY 4.3X12.25 (PERSONAL CARE ITEMS) IMPLANT
TOWEL OR 17X26 10 PK STRL BLUE (TOWEL DISPOSABLE) ×3 IMPLANT
TRAY FOLEY W/BAG SLVR 14FR LF (SET/KITS/TRAYS/PACK) ×3 IMPLANT
WATER STERILE IRR 500ML POUR (IV SOLUTION) ×3 IMPLANT

## 2020-04-22 NOTE — Anesthesia Postprocedure Evaluation (Signed)
Anesthesia Post Note  Patient: Madeline Howard  Procedure(s) Performed: TANDEM RING INSERTION (N/A Cervix) OPERATIVE ULTRASOUND (N/A )     Patient location during evaluation: PACU Anesthesia Type: General Level of consciousness: awake and alert Pain management: pain level controlled Vital Signs Assessment: post-procedure vital signs reviewed and stable Respiratory status: spontaneous breathing, nonlabored ventilation and respiratory function stable Cardiovascular status: blood pressure returned to baseline and stable Postop Assessment: no apparent nausea or vomiting Anesthetic complications: no   No complications documented.  Last Vitals:  Vitals:   04/22/20 1200 04/22/20 1215  BP: 131/69 126/67  Pulse: 65 64  Resp: 16 15  Temp: 36.6 C   SpO2: 97% 96%    Last Pain:  Vitals:   04/22/20 1215  TempSrc:   PainSc: 4                  Lidia Collum

## 2020-04-22 NOTE — Addendum Note (Signed)
Encounter addended by: Wilmon Arms, RN on: 04/22/2020 4:15 PM  Actions taken: Flowsheet accepted, Clinical Note Signed, LDA properties accepted

## 2020-04-22 NOTE — Anesthesia Procedure Notes (Signed)
Procedure Name: LMA Insertion Date/Time: 04/22/2020 10:55 AM Performed by: Mechele Claude, CRNA Pre-anesthesia Checklist: Patient identified, Emergency Drugs available, Suction available and Patient being monitored Patient Re-evaluated:Patient Re-evaluated prior to induction Oxygen Delivery Method: Circle system utilized Preoxygenation: Pre-oxygenation with 100% oxygen Induction Type: IV induction Ventilation: Mask ventilation without difficulty LMA: LMA inserted LMA Size: 4.0 Number of attempts: 1 Airway Equipment and Method: Bite block Placement Confirmation: positive ETCO2 Tube secured with: Tape Dental Injury: Teeth and Oropharynx as per pre-operative assessment  Comments: Performed by Danella Deis,  CRNA

## 2020-04-22 NOTE — Interval H&P Note (Signed)
History and Physical Interval Note:  04/22/2020 10:34 AM  Madeline Howard  has presented today for surgery, with the diagnosis of ENDOCERVIX.  The various methods of treatment have been discussed with the patient and family. After consideration of risks, benefits and other options for treatment, the patient has consented to  Procedure(s): TANDEM RING INSERTION (N/A) OPERATIVE ULTRASOUND (N/A) as a surgical intervention.  The patient's history has been reviewed, patient examined, no change in status, stable for surgery.  I have reviewed the patient's chart and labs.  Questions were answered to the patient's satisfaction.     Gery Pray

## 2020-04-22 NOTE — Addendum Note (Signed)
Encounter addended by: Wilmon Arms, RN on: 04/22/2020 2:43 PM  Actions taken: Order list changed, Diagnosis association updated

## 2020-04-22 NOTE — Patient Instructions (Signed)
IMMEDIATELY FOLLOWING SURGERY: Do not drive or operate machinery for the first twenty four hours after surgery. Do not make any important decisions for twenty four hours after surgery or while taking narcotic pain medications or sedatives. If you develop intractable nausea and vomiting or a severe headache please notify your doctor immediately.   FOLLOW-UP: You do not need to follow up with anesthesia unless specifically instructed to do so.   WOUND CARE INSTRUCTIONS (if applicable): Expect some mild vaginal bleeding, but if large amount of bleeding occurs please contact Dr. Sondra Come at [phone] or the Radiation On-Call physician. Call for any fever greater than 101.0 degrees or increasing vaginal//abdominal pain or trouble urinating.   QUESTIONS?: Please feel free to call your physician or the hospital operator if you have any questions, and they will be happy to assist you. Resume all medications: as listed on your after visit summary. Your next appointment is:  Future Appointments  Date Time Provider [place]  05/02/2020  9:30 AM [facility] 2 [facility]US [place]  05/02/2020  1:00 PM Gery Pray, MD [facility] None  05/02/2020  2:00 PM Gery Pray, MD [facility] None  05/09/2020  7:30 AM WL-US 2 WL-US [place]  05/09/2020 10:00 AM Gery Pray, MD [facility] None  05/09/2020 11:00 AM Gery Pray, MD [facility] None  05/16/2020  7:30 AM WL-US 2 WL-US [place]  05/16/2020 10:00 AM Gery Pray, MD [facility] None  05/16/2020 11:00 AM Gery Pray, MD [facility] None  05/29/2020 12:00 PM [facility] None  05/29/2020 12:15 PM [facility] [place] [facility] None  05/29/2020 12:40 PM Heath Lark, MD [facility] None

## 2020-04-22 NOTE — Transfer of Care (Signed)
Immediate Anesthesia Transfer of Care Note  Patient: Madeline Howard  Procedure(s) Performed: Procedure(s) (LRB): TANDEM RING INSERTION (N/A) OPERATIVE ULTRASOUND (N/A)  Patient Location: PACU  Anesthesia Type: General  Level of Consciousness: awake, alert  and oriented  Airway & Oxygen Therapy: Patient Spontanous Breathing and Patient connected to nasal cannula oxygen  Post-op Assessment: Report given to PACU RN and Post -op Vital signs reviewed and stable  Post vital signs: Reviewed and stable  Complications: No apparent anesthesia complications Last Vitals:  Vitals Value Taken Time  BP 99/64 04/22/20 1130  Temp    Pulse 70 04/22/20 1133  Resp 15 04/22/20 1133  SpO2 100 % 04/22/20 1133  Vitals shown include unvalidated device data.  Last Pain:  Vitals:   04/22/20 0911  TempSrc: Oral  PainSc: 0-No pain      Patients Stated Pain Goal: 5 (11/73/56 7014)  Complications: No complications documented.

## 2020-04-22 NOTE — Addendum Note (Signed)
Encounter addended by: Wilmon Arms, RN on: 04/22/2020 2:57 PM  Actions taken: MAR administration accepted, Flowsheet accepted, Clinical Note Signed

## 2020-04-22 NOTE — Op Note (Signed)
04/22/2020  11:47 AM  PATIENT:  Madeline Howard  55 y.o. female  PRE-OPERATIVE DIAGNOSIS:  ENDOCERVIX  POST-OPERATIVE DIAGNOSIS:  ENDOCERVIX  PROCEDURE:  Procedure(s): TANDEM RING INSERTION (N/A) OPERATIVE ULTRASOUND (N/A)  SURGEON:  Surgeon(s) and Role:    * Gery Pray, MD - Primary  PHYSICIAN ASSISTANT:   ASSISTANTS: none   ANESTHESIA:   general  EBL:  2 mL   BLOOD ADMINISTERED:none  DRAINS: Urinary Catheter (Foley)   LOCAL MEDICATIONS USED:  NONE  SPECIMEN:  No Specimen  DISPOSITION OF SPECIMEN:  N/A  COUNTS:  YES  TOURNIQUET:  * No tourniquets in log *  DICTATION: The patient was taken to outpatient OR #1.  Timeout was performed for the procedure including allergies, preoperative antibiotics, pain medications and procedure.  The patient was prepped and draped in the usual sterile fashion and placed in the dorsolithotomy position.  A Foley catheter was placed without difficulty.  To improve ultrasound imaging the bladder was backfilled with approximately 250 cc of sterile water.  Patient then proceeded to undergo a pelvic exam under anesthesia.  The external genitalia was unremarkable.  No mucosal lesions noted in the vaginal vault.  The cervical os was identified and some erythema to the mucosa was noted consistent with radiation effect.  On bimanual examination the vaginal fornices were present.  The cervical mass had decreased significantly in size.  Pretreatment measurements per imaging and exam was approximately 6.5 cm with right parametrial extension.  Exam today reveals the cervical region to measure approximately 3 x 3 cm in size.  No obvious parametrial extension.    The cervical sleeve placed last week remained in position and therefore no dilation of the cervical os was needed.  Ultrasound images were suboptimal due to excessive bowel gas. The uterus was noted to be in a mildly anteverted position.   This was then followed by placement of a 60 mm tandem (60  degree).  Attached to this tandem was the cervical ring (60 degree) with a small shielding cap in place. She then had placement of a rectal paddle posteriorly to limit high-dose radiation therapy to the rectal mucosa.  The 60 degree angle set was chosen to help limit dose to the bladder.   Ultrasound at the completion of the placement showed accurate position for treatment. the patient tolerated the procedure well.  She was subsequently taken to the recovery room in stable condition.  Later in the day the patient will be transported to the radiation oncology department for CT simulation with tandem and ring in place as well as planning for high-dose-rate radiation therapy and her second high-dose-rate treatment.  Plan is for the patient to receive 5.5 Gy to thel high risk clinical target volume (HRCTV).  Iridium 192 will be the high-dose-rate source.  She is scheduled to receive a total of 5 high-dose-rate treatments which will to complete her definitive course of therapy.   PLAN OF CARE: Transferred to radiation oncology for planning and treatment  PATIENT DISPOSITION:  PACU - hemodynamically stable.   Delay start of Pharmacological VTE agent (>24hrs) due to surgical blood loss or risk of bleeding: not applicable

## 2020-04-22 NOTE — Addendum Note (Signed)
Encounter addended by: Wilmon Arms, RN on: 04/22/2020 2:27 PM  Actions taken: Flowsheet accepted, LDA properties accepted

## 2020-04-22 NOTE — Anesthesia Preprocedure Evaluation (Signed)
Anesthesia Evaluation  Patient identified by MRN, date of birth, ID band Patient awake    Reviewed: Allergy & Precautions, NPO status , Patient's Chart, lab work & pertinent test results  History of Anesthesia Complications Negative for: history of anesthetic complications  Airway Mallampati: II  TM Distance: >3 FB Neck ROM: Full    Dental  (+) Missing, Dental Advisory Given,    Pulmonary Patient abstained from smoking., former smoker,    Pulmonary exam normal        Cardiovascular hypertension, Pt. on medications Normal cardiovascular exam     Neuro/Psych negative neurological ROS  negative psych ROS   GI/Hepatic Neg liver ROS, GERD  Medicated and Controlled,  Endo/Other  negative endocrine ROS  Renal/GU negative Renal ROS  negative genitourinary   Musculoskeletal negative musculoskeletal ROS (+)   Abdominal   Peds  Hematology  (+) anemia , Hgb 11.4   Anesthesia Other Findings   Reproductive/Obstetrics Cervical cancer s/p chemo/XRT                            Anesthesia Physical Anesthesia Plan  ASA: III  Anesthesia Plan: General   Post-op Pain Management:    Induction: Intravenous  PONV Risk Score and Plan: 3 and Ondansetron, Dexamethasone, Midazolam and Treatment may vary due to age or medical condition  Airway Management Planned: LMA  Additional Equipment: None  Intra-op Plan:   Post-operative Plan: Extubation in OR  Informed Consent: I have reviewed the patients History and Physical, chart, labs and discussed the procedure including the risks, benefits and alternatives for the proposed anesthesia with the patient or authorized representative who has indicated his/her understanding and acceptance.     Dental advisory given  Plan Discussed with:   Anesthesia Plan Comments:         Anesthesia Quick Evaluation

## 2020-04-22 NOTE — Addendum Note (Signed)
Encounter addended by: Wilmon Arms, RN on: 04/22/2020 2:52 PM  Actions taken: MAR administration accepted, Flowsheet accepted

## 2020-04-23 ENCOUNTER — Encounter (HOSPITAL_BASED_OUTPATIENT_CLINIC_OR_DEPARTMENT_OTHER): Payer: Self-pay | Admitting: Radiation Oncology

## 2020-04-23 DIAGNOSIS — Z510 Encounter for antineoplastic radiation therapy: Secondary | ICD-10-CM | POA: Diagnosis not present

## 2020-04-23 DIAGNOSIS — C530 Malignant neoplasm of endocervix: Secondary | ICD-10-CM | POA: Diagnosis not present

## 2020-04-25 ENCOUNTER — Ambulatory Visit: Payer: Medicaid Other | Admitting: Radiation Oncology

## 2020-04-26 ENCOUNTER — Other Ambulatory Visit: Payer: Self-pay

## 2020-04-26 ENCOUNTER — Encounter (HOSPITAL_BASED_OUTPATIENT_CLINIC_OR_DEPARTMENT_OTHER): Payer: Self-pay | Admitting: Radiation Oncology

## 2020-04-26 NOTE — Progress Notes (Signed)
Spoke w/ via phone for pre-op interview--- PT Lab needs dos---- Istat and urine preg (per anes)/  Pre-op orders pending              Lab results------ current  EKG  in epic/ chart COVID test ------ 04-29-2020 @ 1121 [facility] at ------- 0730 on 05-02-2020 NPO after MN NO Solid Food.  Clear liquids from MN until--- 0630 Med rec completed Medications to take morning of surgery -----Pepcid Diabetic medication ----- n/a Patient instructed to bring photo id and insurance card day of surgery Patient aware to have Driver (ride ) / caregiver    for 24 hours after surgery -- daughter, Ardelle Balls  Patient Special Instructions ----- n/a Pre-Op special Istructions ----- pre-op orders pending, called and left message  w/ shirley at dr Sondra Come office requested orders Patient verbalized understanding of instructions that were given at this phone interview. Patient denies shortness of breath, chest pain, fever, cough at this phone interview.

## 2020-04-29 ENCOUNTER — Other Ambulatory Visit (HOSPITAL_COMMUNITY)
Admission: RE | Admit: 2020-04-29 | Discharge: 2020-04-29 | Disposition: A | Discharge status: Home / Self Care | Payer: Medicaid Other | Source: Ambulatory Visit | Attending: Radiation Oncology | Admitting: Radiation Oncology

## 2020-04-29 DIAGNOSIS — Z01812 Encounter for preprocedural laboratory examination: Secondary | ICD-10-CM | POA: Insufficient documentation

## 2020-04-29 DIAGNOSIS — Z20822 Contact with and (suspected) exposure to covid-19: Secondary | ICD-10-CM | POA: Diagnosis not present

## 2020-04-30 LAB — SARS CORONAVIRUS 2 (TAT 6-24 HRS): SARS Coronavirus 2: NEGATIVE

## 2020-05-02 ENCOUNTER — Ambulatory Visit
Admission: RE | Admit: 2020-05-02 | Discharge: 2020-05-02 | Disposition: A | Discharge status: Home / Self Care | Payer: Medicaid Other | Source: Ambulatory Visit | Attending: Radiation Oncology | Admitting: Radiation Oncology

## 2020-05-02 ENCOUNTER — Other Ambulatory Visit: Payer: Self-pay

## 2020-05-02 ENCOUNTER — Encounter (HOSPITAL_BASED_OUTPATIENT_CLINIC_OR_DEPARTMENT_OTHER): Payer: Self-pay | Admitting: Radiation Oncology

## 2020-05-02 ENCOUNTER — Ambulatory Visit (HOSPITAL_BASED_OUTPATIENT_CLINIC_OR_DEPARTMENT_OTHER): Payer: Medicaid Other | Admitting: Certified Registered Nurse Anesthetist

## 2020-05-02 ENCOUNTER — Encounter (HOSPITAL_BASED_OUTPATIENT_CLINIC_OR_DEPARTMENT_OTHER)
Admission: RE | Disposition: A | Discharge status: Other Acute Inpatient Hospital | Payer: Self-pay | Source: Home / Self Care | Attending: Radiation Oncology

## 2020-05-02 ENCOUNTER — Ambulatory Visit (HOSPITAL_BASED_OUTPATIENT_CLINIC_OR_DEPARTMENT_OTHER)
Admission: RE | Admit: 2020-05-02 | Discharge: 2020-05-02 | Disposition: A | Discharge status: Other Acute Inpatient Hospital | Payer: Medicaid Other | Attending: Radiation Oncology | Admitting: Radiation Oncology

## 2020-05-02 ENCOUNTER — Ambulatory Visit (HOSPITAL_COMMUNITY)
Admission: RE | Admit: 2020-05-02 | Discharge: 2020-05-02 | Disposition: A | Discharge status: Home / Self Care | Payer: Medicaid Other | Source: Ambulatory Visit | Attending: Radiation Oncology | Admitting: Radiation Oncology

## 2020-05-02 VITALS — BP 124/67 | HR 58 | Temp 96.8°F | Resp 18

## 2020-05-02 DIAGNOSIS — Z923 Personal history of irradiation: Secondary | ICD-10-CM | POA: Insufficient documentation

## 2020-05-02 DIAGNOSIS — C539 Malignant neoplasm of cervix uteri, unspecified: Secondary | ICD-10-CM

## 2020-05-02 DIAGNOSIS — Z87891 Personal history of nicotine dependence: Secondary | ICD-10-CM | POA: Insufficient documentation

## 2020-05-02 DIAGNOSIS — Z808 Family history of malignant neoplasm of other organs or systems: Secondary | ICD-10-CM | POA: Diagnosis not present

## 2020-05-02 DIAGNOSIS — C530 Malignant neoplasm of endocervix: Secondary | ICD-10-CM

## 2020-05-02 DIAGNOSIS — Z800 Family history of malignant neoplasm of digestive organs: Secondary | ICD-10-CM | POA: Insufficient documentation

## 2020-05-02 DIAGNOSIS — Z833 Family history of diabetes mellitus: Secondary | ICD-10-CM | POA: Diagnosis not present

## 2020-05-02 DIAGNOSIS — Z8616 Personal history of COVID-19: Secondary | ICD-10-CM | POA: Diagnosis not present

## 2020-05-02 DIAGNOSIS — Z510 Encounter for antineoplastic radiation therapy: Secondary | ICD-10-CM | POA: Diagnosis not present

## 2020-05-02 DIAGNOSIS — Z9221 Personal history of antineoplastic chemotherapy: Secondary | ICD-10-CM | POA: Insufficient documentation

## 2020-05-02 HISTORY — PX: TANDEM RING INSERTION: SHX6199

## 2020-05-02 HISTORY — PX: OPERATIVE ULTRASOUND: SHX5996

## 2020-05-02 LAB — POCT I-STAT, CHEM 8
BUN: 5 mg/dL — ABNORMAL LOW (ref 6–20)
Calcium, Ion: 1.29 mmol/L (ref 1.15–1.40)
Chloride: 102 mmol/L (ref 98–111)
Creatinine, Ser: 0.5 mg/dL (ref 0.44–1.00)
Glucose, Bld: 91 mg/dL (ref 70–99)
HCT: 37 % (ref 36.0–46.0)
Hemoglobin: 12.6 g/dL (ref 12.0–15.0)
Potassium: 4 mmol/L (ref 3.5–5.1)
Sodium: 141 mmol/L (ref 135–145)
TCO2: 29 mmol/L (ref 22–32)

## 2020-05-02 SURGERY — INSERTION, UTERINE TANDEM AND RING OR CYLINDER, FOR BRACHYTHERAPY
Anesthesia: General | Site: Vagina

## 2020-05-02 MED ORDER — LACTATED RINGERS IV SOLN: INTRAVENOUS | Status: DC

## 2020-05-02 MED ORDER — FENTANYL CITRATE (PF) 100 MCG/2ML IJ SOLN: 25.0000 ug | INTRAMUSCULAR | Status: DC | PRN

## 2020-05-02 MED ORDER — ACETAMINOPHEN 500 MG PO TABS
ORAL_TABLET | ORAL | Status: AC
  Filled 2020-05-02: qty 2

## 2020-05-02 MED ORDER — DEXAMETHASONE SODIUM PHOSPHATE 10 MG/ML IJ SOLN
INTRAMUSCULAR | Status: DC | PRN
  Administered 2020-05-02: 10 mg via INTRAVENOUS

## 2020-05-02 MED ORDER — PROPOFOL 10 MG/ML IV BOLUS
INTRAVENOUS | Status: DC | PRN
  Administered 2020-05-02: 150 mg via INTRAVENOUS

## 2020-05-02 MED ORDER — OXYCODONE HCL 5 MG PO TABS
5.0000 mg | ORAL_TABLET | Freq: Once | ORAL | Status: DC | PRN
Start: 2020-05-02 — End: 2020-05-02

## 2020-05-02 MED ORDER — POVIDONE-IODINE 10 % EX SWAB: 2.0000 "application " | Freq: Once | CUTANEOUS | Status: DC

## 2020-05-02 MED ORDER — PROPOFOL 10 MG/ML IV BOLUS
INTRAVENOUS | Status: AC
  Filled 2020-05-02: qty 20

## 2020-05-02 MED ORDER — FENTANYL CITRATE (PF) 100 MCG/2ML IJ SOLN
INTRAMUSCULAR | Status: AC
  Filled 2020-05-02: qty 2

## 2020-05-02 MED ORDER — HYDROMORPHONE HCL 1 MG/ML IJ SOLN
0.5000 mg | Freq: Once | INTRAMUSCULAR | Status: AC
Start: 2020-05-02 — End: 2020-05-02
  Administered 2020-05-02: 0.5 mg via INTRAVENOUS
  Filled 2020-05-02: qty 1

## 2020-05-02 MED ORDER — MIDAZOLAM HCL 2 MG/2ML IJ SOLN
INTRAMUSCULAR | Status: AC
  Filled 2020-05-02: qty 2

## 2020-05-02 MED ORDER — LIDOCAINE 2% (20 MG/ML) 5 ML SYRINGE
INTRAMUSCULAR | Status: DC | PRN
  Administered 2020-05-02: 100 mg via INTRAVENOUS

## 2020-05-02 MED ORDER — ONDANSETRON HCL 4 MG/2ML IJ SOLN
INTRAMUSCULAR | Status: DC | PRN
  Administered 2020-05-02: 4 mg via INTRAVENOUS

## 2020-05-02 MED ORDER — LIDOCAINE 2% (20 MG/ML) 5 ML SYRINGE
INTRAMUSCULAR | Status: AC
  Filled 2020-05-02: qty 5

## 2020-05-02 MED ORDER — LACTATED RINGERS IV SOLN
INTRAVENOUS | Status: DC
  Filled 2020-05-02 (×2): qty 250

## 2020-05-02 MED ORDER — MIDAZOLAM HCL 5 MG/5ML IJ SOLN
INTRAMUSCULAR | Status: DC | PRN
  Administered 2020-05-02: 2 mg via INTRAVENOUS

## 2020-05-02 MED ORDER — ONDANSETRON HCL 4 MG/2ML IJ SOLN
INTRAMUSCULAR | Status: AC
  Filled 2020-05-02: qty 2

## 2020-05-02 MED ORDER — PROMETHAZINE HCL 25 MG/ML IJ SOLN: 6.2500 mg | INTRAMUSCULAR | Status: DC | PRN

## 2020-05-02 MED ORDER — ACETAMINOPHEN 500 MG PO TABS
1000.0000 mg | ORAL_TABLET | Freq: Once | ORAL | Status: AC
  Administered 2020-05-02: 1000 mg via ORAL

## 2020-05-02 MED ORDER — FENTANYL CITRATE (PF) 100 MCG/2ML IJ SOLN
INTRAMUSCULAR | Status: DC | PRN
  Administered 2020-05-02: 25 ug via INTRAVENOUS
  Administered 2020-05-02: 50 ug via INTRAVENOUS
  Administered 2020-05-02: 25 ug via INTRAVENOUS

## 2020-05-02 MED ORDER — OXYCODONE HCL 5 MG/5ML PO SOLN: 5.0000 mg | Freq: Once | ORAL | Status: DC | PRN

## 2020-05-02 SURGICAL SUPPLY — 23 items
BNDG CONFORM 2 STRL LF (GAUZE/BANDAGES/DRESSINGS) IMPLANT
COVER WAND RF STERILE (DRAPES) ×3 IMPLANT
DILATOR CANAL MILEX (MISCELLANEOUS) IMPLANT
DRSG PAD ABDOMINAL 8X10 ST (GAUZE/BANDAGES/DRESSINGS) ×3 IMPLANT
GAUZE 4X4 16PLY RFD (DISPOSABLE) IMPLANT
GLOVE SURG ENC MOIS LTX SZ6.5 (GLOVE) ×3 IMPLANT
GLOVE SURG ENC MOIS LTX SZ7.5 (GLOVE) ×6 IMPLANT
GLOVE SURG UNDER POLY LF SZ6.5 (GLOVE) ×3 IMPLANT
GLOVE SURG UNDER POLY LF SZ7 (GLOVE) ×3 IMPLANT
GOWN STRL REUS W/TWL LRG LVL3 (GOWN DISPOSABLE) ×6 IMPLANT
HOLDER FOLEY CATH W/STRAP (MISCELLANEOUS) ×3 IMPLANT
IV NS 1000ML (IV SOLUTION)
IV NS 1000ML BAXH (IV SOLUTION) IMPLANT
IV SET EXTENSION GRAVITY 40 LF (IV SETS) ×3 IMPLANT
KIT TURNOVER CYSTO (KITS) ×3 IMPLANT
MAT PREVALON FULL STRYKER (MISCELLANEOUS) ×3 IMPLANT
NS IRRIG 500ML POUR BTL (IV SOLUTION) ×3 IMPLANT
PACK VAGINAL MINOR WOMEN LF (CUSTOM PROCEDURE TRAY) ×3 IMPLANT
PACKING VAGINAL (PACKING) IMPLANT
PAD OB MATERNITY 4.3X12.25 (PERSONAL CARE ITEMS) IMPLANT
TOWEL OR 17X26 10 PK STRL BLUE (TOWEL DISPOSABLE) ×3 IMPLANT
TRAY FOLEY W/BAG SLVR 14FR LF (SET/KITS/TRAYS/PACK) ×3 IMPLANT
WATER STERILE IRR 500ML POUR (IV SOLUTION) IMPLANT

## 2020-05-02 NOTE — Progress Notes (Signed)
  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 Identity was confirmed. All relevant records and images related to the planned course of therapy were reviewed. The patient freely provided informed written consent to proceed with treatment after reviewing the details related to the planned course of therapy. The consent form was witnessed and verified by the simulation staff. Then, the patient was set-up in a stable reproducible supine position for radiation therapy. The tandem ring system was accessed and fiducial markers were placed within the tandem and ring.   Simple treatment device note: On the operating room the patient had construction of her custom tandem ring system. She will be treated with a 45 tandem/ring system. The patient had placement of a 60 mm tandem. A cervical ring with a small shielding was used for her treatment. A rectal paddle was also part of her custom set up device.  Verification simulation note: An AP and lateral film was obtained through the pelvis area. This was compared to the patient's planning films documenting accurate position of the tandem/ring system for treatment.  High-dose-rate brachytherapy treatment note:  The remote afterloading device was accessed through catheter system and attached to the tandem ring system. Patient then proceeded to undergo her third high-dose-rate treatment directed at the cervix. The patient was prescribed a dose of 5.5 gray to be delivered to the high risk clinical target volume. Patient was treated with 2 channels using 22 dwell positions. Treatment time was 684.5 seconds. The patient tolerated the procedure well. After completion of her  therapy, a radiation survey was performed documenting return of the iridium source into the Gamma[employer] safe. The patient was then transferred to the nursing suite. She then had removal of the rectal paddle followed by the tandem and ring system. The patient tolerated the removal well.  The patient tolerated the overall procedure well.  She only required 0.5 mg of Dilaudid IV for pain issues.  PLAN: Patient will return next week for her fourth high-dose-rate treatment directed at the cervix region. ________________________________    Blair Promise, PhD, MD  This document serves as a record of services personally performed by Gery Pray, MD. It was created on his behalf by Clerance Lav, a trained medical scribe. The creation of this record is based on the scribe's personal observations and the provider's statements to them. This document has been checked and approved by the attending provider.

## 2020-05-02 NOTE — Interval H&P Note (Signed)
History and Physical Interval Note:  05/02/2020 9:24 AM  Madeline Howard  has presented today for surgery, with the diagnosis of ENDOCERVICAL CANCER.  The various methods of treatment have been discussed with the patient and family. After consideration of risks, benefits and other options for treatment, the patient has consented to  Procedure(s): TANDEM RING INSERTION (N/A) OPERATIVE ULTRASOUND (N/A) as a surgical intervention.  The patient's history has been reviewed, patient examined, no change in status, stable for surgery.  I have reviewed the patient's chart and labs.  Questions were answered to the patient's satisfaction.     Gery Pray

## 2020-05-02 NOTE — Anesthesia Procedure Notes (Signed)
Procedure Name: LMA Insertion Date/Time: 05/02/2020 9:34 AM Performed by: Genelle Bal, CRNA Pre-anesthesia Checklist: Patient identified, Emergency Drugs available, Suction available and Patient being monitored Patient Re-evaluated:Patient Re-evaluated prior to induction Oxygen Delivery Method: Circle system utilized Preoxygenation: Pre-oxygenation with 100% oxygen Induction Type: IV induction Ventilation: Mask ventilation without difficulty LMA: LMA inserted LMA Size: 4.0 Number of attempts: 1 Airway Equipment and Method: Bite block Placement Confirmation: positive ETCO2 Tube secured with: Tape Dental Injury: Teeth and Oropharynx as per pre-operative assessment

## 2020-05-02 NOTE — Transfer of Care (Signed)
Immediate Anesthesia Transfer of Care Note  Patient: Madeline Howard  Procedure(s) Performed: TANDEM RING INSERTION (N/A Vagina ) OPERATIVE ULTRASOUND (N/A Abdomen)  Patient Location: PACU  Anesthesia Type:General  Level of Consciousness: drowsy and patient cooperative  Airway & Oxygen Therapy: Patient Spontanous Breathing and Patient connected to face mask oxygen  Post-op Assessment: Report given to RN and Post -op Vital signs reviewed and stable  Post vital signs: Reviewed and stable  Last Vitals:  Vitals Value Taken Time  BP 108/65   Temp    Pulse 70 05/02/20 1004  Resp 14 05/02/20 1004  SpO2 100 % 05/02/20 1004  Vitals shown include unvalidated device data.  Last Pain:  Vitals:   05/02/20 0801  TempSrc: Oral      Patients Stated Pain Goal: 4 (65/99/35 7017)  Complications: No complications documented.

## 2020-05-02 NOTE — Op Note (Signed)
05/02/2020  10:28 AM  PATIENT:  Madeline Howard  55 y.o. female  PRE-OPERATIVE DIAGNOSIS:  ENDOCERVICAL CANCER  POST-OPERATIVE DIAGNOSIS:  ENDOCERVICAL CANCER  PROCEDURE:  Procedure(s): TANDEM RING INSERTION (N/A) OPERATIVE ULTRASOUND (N/A)  SURGEON:  Surgeon(s) and Role:    * Gery Pray, MD - Primary  PHYSICIAN ASSISTANT:   ASSISTANTS: none   ANESTHESIA:   general  EBL:  none  BLOOD ADMINISTERED:none  DRAINS: Urinary Catheter (Foley)   LOCAL MEDICATIONS USED:  NONE  SPECIMEN:  No Specimen  DISPOSITION OF SPECIMEN:  N/A  COUNTS:  YES  TOURNIQUET:  * No tourniquets in log *  DICTATION:The patient was taken to outpatient OR #1. Timeout was performed for the procedure including allergies, preoperative antibiotics, pain medications and procedure. The patient was prepped and draped in the usual sterile fashion and placed in the dorsolithotomy position. A Foley catheter was placed without difficulty. To improve ultrasound imaging the bladder was backfilled with approximately 250 cc of sterile water. Patient then proceeded to undergo a pelvic exam under anesthesia. The external genitalia was unremarkable. No mucosal lesions noted in the vaginal vault. The cervical os was identified and some erythema to the mucosa was noted consistent with radiation effect. On bimanual examination the vaginal fornices were present. The cervical mass had decreased significantly in size. Pretreatment measurements per imaging and exam was approximately 6.5 cm with right parametrial extension. Exam today reveals the cervical region to measure approximately 3 x 3 cm in size. No obvious parametrial extension.   The cervical sleeve placed last week remained in position and therefore no dilation of the cervical os was needed.  Ultrasound images were suboptimal due to excessive bowel gas.The uterus was noted to be in a mildly anteverted position. This was then followed by placement of a 65mm  tandem (45 degree). Attached to this tandemwas the cervical ring (45 degree) with a small shielding cap in place. Shethen had placement of a rectal paddle posteriorly to limit high-dose radiation therapy to the rectal mucosa.   Ultrasound at the completion of the placement showed accurate position for treatment. the patient tolerated the procedure well. She was subsequently taken to the recoveryroom in stablecondition. Later in the day the patient will be transported to the radiation oncology department for CT simulation with tandem and ring in place as well as planning for high-dose-rate radiation therapy and her third high-dose-rate treatment. Plan is for the patient to receive 5.5 Gy to thel high risk clinicaltarget volume (HRCTV). Iridium 192 will be the high-dose-rate source. She is scheduled to receive a total of 5 high-dose-rate treatments which will to complete her definitive course of therapy.  PLAN OF CARE: Transferred to radiation oncology for planning and treatment  PATIENT DISPOSITION:  PACU - hemodynamically stable.   Delay start of Pharmacological VTE agent (>24hrs) due to surgical blood loss or risk of bleeding: not applicable

## 2020-05-02 NOTE — Anesthesia Preprocedure Evaluation (Addendum)
Anesthesia Evaluation  Patient identified by MRN, date of birth, ID band Patient awake    Reviewed: Allergy & Precautions, NPO status , Patient's Chart, lab work & pertinent test results  History of Anesthesia Complications Negative for: history of anesthetic complications  Airway Mallampati: II  TM Distance: >3 FB Neck ROM: Full    Dental  (+) Missing,    Pulmonary former smoker,    Pulmonary exam normal        Cardiovascular hypertension, Pt. on medications Normal cardiovascular exam     Neuro/Psych negative neurological ROS  negative psych ROS   GI/Hepatic Neg liver ROS, GERD  Medicated and Controlled,  Endo/Other  negative endocrine ROS  Renal/GU negative Renal ROS  negative genitourinary   Musculoskeletal negative musculoskeletal ROS (+)   Abdominal   Peds  Hematology  (+) anemia ,   Anesthesia Other Findings Day of surgery medications reviewed with patient.  Reproductive/Obstetrics Cervical ca                            Anesthesia Physical Anesthesia Plan  ASA: II  Anesthesia Plan: General   Post-op Pain Management:    Induction: Intravenous  PONV Risk Score and Plan: 3 and Treatment may vary due to age or medical condition, Ondansetron, Dexamethasone and Midazolam  Airway Management Planned: LMA  Additional Equipment: None  Intra-op Plan:   Post-operative Plan: Extubation in OR  Informed Consent: I have reviewed the patients History and Physical, chart, labs and discussed the procedure including the risks, benefits and alternatives for the proposed anesthesia with the patient or authorized representative who has indicated his/her understanding and acceptance.     Dental advisory given  Plan Discussed with: CRNA  Anesthesia Plan Comments:        Anesthesia Quick Evaluation

## 2020-05-02 NOTE — Patient Instructions (Signed)
IMMEDIATELY FOLLOWING SURGERY: Do not drive or operate machinery for the first twenty four hours after surgery. Do not make any important decisions for twenty four hours after surgery or while taking narcotic pain medications or sedatives. If you develop intractable nausea and vomiting or a severe headache please notify your doctor immediately.   FOLLOW-UP: You do not need to follow up with anesthesia unless specifically instructed to do so.   WOUND CARE INSTRUCTIONS (if applicable): Expect some mild vaginal bleeding, but if large amount of bleeding occurs please contact Dr. Sondra Come at [phone] or the Radiation On-Call physician. Call for any fever greater than 101.0 degrees or increasing vaginal//abdominal pain or trouble urinating.   QUESTIONS?: Please feel free to call your physician or the hospital operator if you have any questions, and they will be happy to assist you. Resume all medications: as listed on your after visit summary. Your next appointment is:  Future Appointments  Date Time Provider [place]  05/02/2020 12:00 PM Gery Pray, MD [facility] None  05/02/2020  2:00 PM Gery Pray, MD [facility] None  05/09/2020  7:30 AM WL-US 2 WL-US [place]  05/09/2020 10:00 AM Gery Pray, MD [facility] None  05/09/2020  1:00 PM Gery Pray, MD [place] None  05/16/2020  7:30 AM WL-US 2 WL-US [place]  05/16/2020 10:00 AM Gery Pray, MD [facility] None  05/16/2020 11:00 AM Gery Pray, MD [facility] None  05/29/2020 12:00 PM [facility] None  05/29/2020 12:15 PM [facility] [place] [facility] None  05/29/2020 12:40 PM Heath Lark, MD [facility] None

## 2020-05-02 NOTE — Discharge Instructions (Signed)

## 2020-05-02 NOTE — Anesthesia Postprocedure Evaluation (Signed)
Anesthesia Post Note  Patient: Madeline Howard  Procedure(s) Performed: TANDEM RING INSERTION (N/A Vagina ) OPERATIVE ULTRASOUND (N/A Abdomen)     Patient location during evaluation: PACU Anesthesia Type: General Level of consciousness: awake and alert and oriented Pain management: pain level controlled Vital Signs Assessment: post-procedure vital signs reviewed and stable Respiratory status: spontaneous breathing, nonlabored ventilation and respiratory function stable Cardiovascular status: blood pressure returned to baseline Postop Assessment: no apparent nausea or vomiting Anesthetic complications: no   No complications documented.  Last Vitals:  Vitals:   05/02/20 1015 05/02/20 1030  BP: 130/72 119/69  Pulse: 83 72  Resp: 14 16  Temp:  36.5 C  SpO2: 99% 97%    Last Pain:  Vitals:   05/02/20 1030  TempSrc:   PainSc: 0-No pain                 Brennan Bailey

## 2020-05-03 ENCOUNTER — Other Ambulatory Visit: Payer: Self-pay

## 2020-05-03 ENCOUNTER — Encounter (HOSPITAL_BASED_OUTPATIENT_CLINIC_OR_DEPARTMENT_OTHER): Payer: Self-pay | Admitting: Radiation Oncology

## 2020-05-03 NOTE — Progress Notes (Signed)
Spoke w/ via phone for pre-op interview--- PT Lab needs dos---- Madeline Howard  (per anes)/  Pre-op orders pending              Lab results------ current  EKG  in epic/ chart COVID test ------ 05-06-2020 @ 1105 Arrive at ------- 0530 on 05-09-2020 NPO after MN NO Solid Food.  Clear liquids from MN until--- 0530 Med rec completed Medications to take morning of surgery -----Pepcid Diabetic medication ----- n/a Patient instructed to bring photo id and insurance card day of surgery Patient aware to have Driver (ride ) / caregiver    for 24 hours after surgery -- daughter, Ardelle Balls  Patient Special Instructions ----- n/a Pre-Op special Istructions ----- pre-op orders pending, called and left message  w/ shirley at dr Sondra Come office requested orders Patient verbalized understanding of instructions that were given at this phone interview. Patient denies shortness of breath, chest pain, fever, cough at this phone interview.

## 2020-05-06 ENCOUNTER — Other Ambulatory Visit (HOSPITAL_COMMUNITY)
Admission: RE | Admit: 2020-05-06 | Discharge: 2020-05-06 | Disposition: A | Discharge status: Home / Self Care | Payer: Medicaid Other | Source: Ambulatory Visit | Attending: Radiation Oncology | Admitting: Radiation Oncology

## 2020-05-06 DIAGNOSIS — Z01812 Encounter for preprocedural laboratory examination: Secondary | ICD-10-CM | POA: Insufficient documentation

## 2020-05-06 DIAGNOSIS — Z20822 Contact with and (suspected) exposure to covid-19: Secondary | ICD-10-CM | POA: Insufficient documentation

## 2020-05-06 LAB — SARS CORONAVIRUS 2 (TAT 6-24 HRS): SARS Coronavirus 2: NEGATIVE

## 2020-05-08 ENCOUNTER — Other Ambulatory Visit: Payer: Self-pay | Admitting: Hematology and Oncology

## 2020-05-08 NOTE — Anesthesia Preprocedure Evaluation (Addendum)
Anesthesia Evaluation  Patient identified by MRN, date of birth, ID band Patient awake    Reviewed: Allergy & Precautions, NPO status , Patient's Chart, lab work & pertinent test results  Airway Mallampati: I  TM Distance: >3 FB Neck ROM: Full    Dental  (+) Teeth Intact, Dental Advisory Given   Pulmonary former smoker,  Quit smoking 2018, 23 pack year history    Pulmonary exam normal breath sounds clear to auscultation       Cardiovascular hypertension, Pt. on medications Normal cardiovascular exam Rhythm:Regular Rate:Normal     Neuro/Psych negative neurological ROS  negative psych ROS   GI/Hepatic Neg liver ROS, GERD  Medicated and Controlled,  Endo/Other  negative endocrine ROS  Renal/GU negative Renal ROS  Female GU complaint (cervical ca)     Musculoskeletal negative musculoskeletal ROS (+)   Abdominal   Peds  Hematology negative hematology ROS (+)   Anesthesia Other Findings   Reproductive/Obstetrics negative OB ROS                            Anesthesia Physical Anesthesia Plan  ASA: II  Anesthesia Plan: General   Post-op Pain Management:    Induction: Intravenous  PONV Risk Score and Plan: 4 or greater and Ondansetron, Dexamethasone, Midazolam and Treatment may vary due to age or medical condition  Airway Management Planned: LMA  Additional Equipment: None  Intra-op Plan:   Post-operative Plan: Extubation in OR  Informed Consent: I have reviewed the patients History and Physical, chart, labs and discussed the procedure including the risks, benefits and alternatives for the proposed anesthesia with the patient or authorized representative who has indicated his/her understanding and acceptance.     Dental advisory given  Plan Discussed with: CRNA  Anesthesia Plan Comments:        Anesthesia Quick Evaluation

## 2020-05-09 ENCOUNTER — Ambulatory Visit
Admission: RE | Admit: 2020-05-09 | Discharge: 2020-05-09 | Disposition: A | Discharge status: Home / Self Care | Payer: Medicaid Other | Source: Ambulatory Visit | Attending: Radiation Oncology | Admitting: Radiation Oncology

## 2020-05-09 ENCOUNTER — Ambulatory Visit (HOSPITAL_COMMUNITY)
Admission: RE | Admit: 2020-05-09 | Discharge: 2020-05-09 | Disposition: A | Discharge status: Home / Self Care | Payer: Medicaid Other | Source: Ambulatory Visit | Attending: Radiation Oncology | Admitting: Radiation Oncology

## 2020-05-09 ENCOUNTER — Other Ambulatory Visit: Payer: Self-pay

## 2020-05-09 ENCOUNTER — Encounter (HOSPITAL_BASED_OUTPATIENT_CLINIC_OR_DEPARTMENT_OTHER)
Admission: RE | Disposition: A | Discharge status: Home / Self Care | Payer: Self-pay | Source: Ambulatory Visit | Attending: Radiation Oncology

## 2020-05-09 ENCOUNTER — Ambulatory Visit (HOSPITAL_BASED_OUTPATIENT_CLINIC_OR_DEPARTMENT_OTHER): Payer: Medicaid Other | Admitting: Anesthesiology

## 2020-05-09 ENCOUNTER — Ambulatory Visit (HOSPITAL_BASED_OUTPATIENT_CLINIC_OR_DEPARTMENT_OTHER)
Admission: RE | Admit: 2020-05-09 | Discharge: 2020-05-09 | Disposition: A | Discharge status: Home / Self Care | Payer: Medicaid Other | Source: Ambulatory Visit | Attending: Radiation Oncology | Admitting: Radiation Oncology

## 2020-05-09 ENCOUNTER — Encounter (HOSPITAL_BASED_OUTPATIENT_CLINIC_OR_DEPARTMENT_OTHER): Payer: Self-pay | Admitting: Radiation Oncology

## 2020-05-09 VITALS — BP 110/67 | HR 65 | Temp 97.9°F | Resp 20

## 2020-05-09 DIAGNOSIS — Z87891 Personal history of nicotine dependence: Secondary | ICD-10-CM | POA: Diagnosis not present

## 2020-05-09 DIAGNOSIS — C539 Malignant neoplasm of cervix uteri, unspecified: Secondary | ICD-10-CM | POA: Insufficient documentation

## 2020-05-09 DIAGNOSIS — C530 Malignant neoplasm of endocervix: Secondary | ICD-10-CM

## 2020-05-09 DIAGNOSIS — Z510 Encounter for antineoplastic radiation therapy: Secondary | ICD-10-CM | POA: Diagnosis not present

## 2020-05-09 DIAGNOSIS — Z8616 Personal history of COVID-19: Secondary | ICD-10-CM | POA: Insufficient documentation

## 2020-05-09 HISTORY — PX: OPERATIVE ULTRASOUND: SHX5996

## 2020-05-09 HISTORY — PX: TANDEM RING INSERTION: SHX6199

## 2020-05-09 LAB — POCT I-STAT, CHEM 8
BUN: <3 mg/dL — ABNORMAL LOW (ref 6–20)
Calcium, Ion: 1.25 mmol/L (ref 1.15–1.40)
Chloride: 103 mmol/L (ref 98–111)
Creatinine, Ser: 0.6 mg/dL (ref 0.44–1.00)
Glucose, Bld: 76 mg/dL (ref 70–99)
HCT: 39 % (ref 36.0–46.0)
Hemoglobin: 13.3 g/dL (ref 12.0–15.0)
Potassium: 3.2 mmol/L — ABNORMAL LOW (ref 3.5–5.1)
Sodium: 143 mmol/L (ref 135–145)
TCO2: 25 mmol/L (ref 22–32)

## 2020-05-09 SURGERY — INSERTION, UTERINE TANDEM AND RING OR CYLINDER, FOR BRACHYTHERAPY
Anesthesia: General | Site: Pelvis

## 2020-05-09 MED ORDER — ACETAMINOPHEN 500 MG PO TABS
1000.0000 mg | ORAL_TABLET | Freq: Once | ORAL | Status: AC
  Administered 2020-05-09: 1000 mg via ORAL

## 2020-05-09 MED ORDER — MIDAZOLAM HCL 5 MG/5ML IJ SOLN
INTRAMUSCULAR | Status: DC | PRN
  Administered 2020-05-09: 2 mg via INTRAVENOUS

## 2020-05-09 MED ORDER — ONDANSETRON HCL 4 MG/2ML IJ SOLN
INTRAMUSCULAR | Status: DC | PRN
  Administered 2020-05-09: 4 mg via INTRAVENOUS

## 2020-05-09 MED ORDER — SODIUM CHLORIDE 0.9 % IR SOLN
Status: DC | PRN
  Administered 2020-05-09: 200 mL via INTRAVESICAL

## 2020-05-09 MED ORDER — OXYCODONE HCL 5 MG PO TABS: 5.0000 mg | ORAL_TABLET | Freq: Once | ORAL | Status: DC | PRN

## 2020-05-09 MED ORDER — LIDOCAINE 2% (20 MG/ML) 5 ML SYRINGE
INTRAMUSCULAR | Status: DC | PRN
  Administered 2020-05-09: 60 mg via INTRAVENOUS

## 2020-05-09 MED ORDER — PHENYLEPHRINE HCL (PRESSORS) 10 MG/ML IV SOLN
INTRAVENOUS | Status: DC | PRN
  Administered 2020-05-09 (×5): 80 ug via INTRAVENOUS

## 2020-05-09 MED ORDER — POVIDONE-IODINE 10 % EX SWAB: 2.0000 "application " | Freq: Once | CUTANEOUS | Status: DC

## 2020-05-09 MED ORDER — LACTATED RINGERS IV SOLN
INTRAVENOUS | Status: DC
  Filled 2020-05-09 (×2): qty 250

## 2020-05-09 MED ORDER — LIDOCAINE 2% (20 MG/ML) 5 ML SYRINGE
INTRAMUSCULAR | Status: AC
  Filled 2020-05-09: qty 5

## 2020-05-09 MED ORDER — DEXAMETHASONE SODIUM PHOSPHATE 10 MG/ML IJ SOLN
INTRAMUSCULAR | Status: AC
  Filled 2020-05-09: qty 1

## 2020-05-09 MED ORDER — ONDANSETRON HCL 4 MG/2ML IJ SOLN
INTRAMUSCULAR | Status: AC
  Filled 2020-05-09: qty 2

## 2020-05-09 MED ORDER — OXYCODONE HCL 5 MG/5ML PO SOLN
5.0000 mg | Freq: Once | ORAL | Status: DC | PRN
Start: 2020-05-09 — End: 2020-05-11

## 2020-05-09 MED ORDER — HYDROMORPHONE HCL 1 MG/ML IJ SOLN: 0.2500 mg | INTRAMUSCULAR | Status: DC | PRN

## 2020-05-09 MED ORDER — KETOROLAC TROMETHAMINE 30 MG/ML IJ SOLN
INTRAMUSCULAR | Status: AC
  Filled 2020-05-09: qty 1

## 2020-05-09 MED ORDER — MEPERIDINE HCL 25 MG/ML IJ SOLN: 6.2500 mg | INTRAMUSCULAR | Status: DC | PRN

## 2020-05-09 MED ORDER — KETOROLAC TROMETHAMINE 30 MG/ML IJ SOLN
INTRAMUSCULAR | Status: DC | PRN
  Administered 2020-05-09: 30 mg via INTRAVENOUS

## 2020-05-09 MED ORDER — ACETAMINOPHEN 500 MG PO TABS
ORAL_TABLET | ORAL | Status: AC
  Filled 2020-05-09: qty 2

## 2020-05-09 MED ORDER — FENTANYL CITRATE (PF) 100 MCG/2ML IJ SOLN
INTRAMUSCULAR | Status: AC
  Filled 2020-05-09: qty 2

## 2020-05-09 MED ORDER — FENTANYL CITRATE (PF) 100 MCG/2ML IJ SOLN
INTRAMUSCULAR | Status: DC | PRN
  Administered 2020-05-09: 50 ug via INTRAVENOUS
  Administered 2020-05-09 (×2): 25 ug via INTRAVENOUS

## 2020-05-09 MED ORDER — PROMETHAZINE HCL 25 MG/ML IJ SOLN: 6.2500 mg | INTRAMUSCULAR | Status: DC | PRN

## 2020-05-09 MED ORDER — KETOROLAC TROMETHAMINE 30 MG/ML IJ SOLN: 30.0000 mg | Freq: Once | INTRAMUSCULAR | Status: AC | PRN

## 2020-05-09 MED ORDER — LACTATED RINGERS IV SOLN: INTRAVENOUS | Status: DC

## 2020-05-09 MED ORDER — DEXAMETHASONE SODIUM PHOSPHATE 10 MG/ML IJ SOLN
INTRAMUSCULAR | Status: DC | PRN
  Administered 2020-05-09: 10 mg via INTRAVENOUS

## 2020-05-09 MED ORDER — PROPOFOL 10 MG/ML IV BOLUS
INTRAVENOUS | Status: DC | PRN
  Administered 2020-05-09: 200 mg via INTRAVENOUS

## 2020-05-09 MED ORDER — PROPOFOL 10 MG/ML IV BOLUS
INTRAVENOUS | Status: AC
  Filled 2020-05-09: qty 20

## 2020-05-09 MED ORDER — MIDAZOLAM HCL 2 MG/2ML IJ SOLN
INTRAMUSCULAR | Status: AC
  Filled 2020-05-09: qty 2

## 2020-05-09 SURGICAL SUPPLY — 19 items
BNDG CONFORM 2 STRL LF (GAUZE/BANDAGES/DRESSINGS) IMPLANT
COVER WAND RF STERILE (DRAPES) ×3 IMPLANT
DILATOR CANAL MILEX (MISCELLANEOUS) IMPLANT
DRSG PAD ABDOMINAL 8X10 ST (GAUZE/BANDAGES/DRESSINGS) ×3 IMPLANT
GAUZE 4X4 16PLY RFD (DISPOSABLE) ×3 IMPLANT
GLOVE SURG ENC MOIS LTX SZ7.5 (GLOVE) ×6 IMPLANT
GOWN STRL REUS W/TWL LRG LVL3 (GOWN DISPOSABLE) ×3 IMPLANT
HOLDER FOLEY CATH W/STRAP (MISCELLANEOUS) ×3 IMPLANT
IV NS 1000ML (IV SOLUTION) ×3
IV NS 1000ML BAXH (IV SOLUTION) ×2 IMPLANT
IV SET EXTENSION GRAVITY 40 LF (IV SETS) ×3 IMPLANT
KIT TURNOVER CYSTO (KITS) ×3 IMPLANT
MAT PREVALON FULL STRYKER (MISCELLANEOUS) ×3 IMPLANT
PACK VAGINAL MINOR WOMEN LF (CUSTOM PROCEDURE TRAY) ×3 IMPLANT
PACKING VAGINAL (PACKING) IMPLANT
PAD OB MATERNITY 4.3X12.25 (PERSONAL CARE ITEMS) IMPLANT
TOWEL OR 17X26 10 PK STRL BLUE (TOWEL DISPOSABLE) ×3 IMPLANT
TRAY FOLEY W/BAG SLVR 14FR LF (SET/KITS/TRAYS/PACK) ×3 IMPLANT
WATER STERILE IRR 500ML POUR (IV SOLUTION) ×3 IMPLANT

## 2020-05-09 NOTE — Op Note (Signed)
  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 Identity was confirmed. All relevant records and images related to the planned course of therapy were reviewed. The patient freely provided informed written consent to proceed with treatment after reviewing the details related to the planned course of therapy. The consent form was witnessed and verified by the simulation staff. Then, the patient was set-up in a stable reproducible supine position for radiation therapy. The tandem ring system was accessed and fiducial markers were placed within the tandem and ring.   Simple treatment device note: On the operating room the patient had construction of her custom tandem ring system. She will be treated with a 45 tandem/ring system. The patient had placement of a 60 mm tandem. A cervical ring with a large shielding cap was used for her treatment. A rectal paddle was also part of her custom set up device.  Verification simulation note: An AP and lateral film was obtained through the pelvis area. This was compared to the patient's planning films documenting accurate position of the tandem/ring system for treatment.  High-dose-rate brachytherapy treatment note:  The remote afterloading device was accessed through catheter system and attached to the tandem ring system. Patient then proceeded to undergo her fourth high-dose-rate treatment directed at the cervix. The patient was prescribed a dose of 5.5 gray to be delivered to the high risk clinical target volume. Patient was treated with 2 channels using 22 dwell positions. Treatment time was 740.4 seconds. The patient tolerated the procedure well. After  completion of her therapy, a radiation survey was performed documenting return of the iridium source into the [employer] safe. The patient was then transferred to the nursing suite. She then had removal of the rectal paddle followed by the tandem and ring system. The patient tolerated the removal well.  The patient tolerated the overall procedure well.    PLAN: Patient will return next week for her fifth and final high-dose-rate treatment directed at the cervix region. ________________________________    Blair Promise, PhD, MD  This document serves as a record of services personally performed by Gery Pray, MD. It was created on his behalf by Clerance Lav, a trained medical scribe. The creation of this record is based on the scribe's personal observations and the provider's statements to them. This document has been checked and approved by the attending provider.

## 2020-05-09 NOTE — Transfer of Care (Signed)
Immediate Anesthesia Transfer of Care Note  Patient: Madeline Howard  Procedure(s) Performed: TANDEM RING INSERTION (N/A Cervix) OPERATIVE ULTRASOUND (N/A Pelvis)  Patient Location: PACU  Anesthesia Type:General  Level of Consciousness: awake, alert , oriented and patient cooperative  Airway & Oxygen Therapy: Patient Spontanous Breathing  Post-op Assessment: Report given to RN and Post -op Vital signs reviewed and stable  Post vital signs: Reviewed and stable  Last Vitals:  Vitals Value Taken Time  BP 111/66 05/09/20 0824  Temp    Pulse 76 05/09/20 0826  Resp 14 05/09/20 0826  SpO2 96 % 05/09/20 0826  Vitals shown include unvalidated device data.  Last Pain:  Vitals:   05/09/20 0547  TempSrc: Oral         Complications: No complications documented.

## 2020-05-09 NOTE — Progress Notes (Deleted)
  The note originally documented on this encounter has been moved the the encounter in which it belongs.  

## 2020-05-09 NOTE — Anesthesia Postprocedure Evaluation (Signed)
Anesthesia Post Note  Patient: KARINNE SCHMADER  Procedure(s) Performed: TANDEM RING INSERTION (N/A Cervix) OPERATIVE ULTRASOUND (N/A Pelvis)     Patient location during evaluation: PACU Anesthesia Type: General Level of consciousness: awake and alert, oriented and patient cooperative Pain management: pain level controlled Vital Signs Assessment: post-procedure vital signs reviewed and stable Respiratory status: spontaneous breathing, nonlabored ventilation and respiratory function stable Cardiovascular status: blood pressure returned to baseline and stable Postop Assessment: no apparent nausea or vomiting Anesthetic complications: no   No complications documented.  Last Vitals:  Vitals:   05/09/20 0845 05/09/20 0900  BP: 113/65 116/67  Pulse: 80 67  Resp: (!) 22 17  Temp:  36.6 C  SpO2: 99% 95%    Last Pain:  Vitals:   05/09/20 0900  TempSrc:   PainSc: 0-No pain                 Pervis Hocking

## 2020-05-09 NOTE — Patient Instructions (Signed)
IMMEDIATELY FOLLOWING SURGERY: Do not drive or operate machinery for the first twenty four hours after surgery. Do not make any important decisions for twenty four hours after surgery or while taking narcotic pain medications or sedatives. If you develop intractable nausea and vomiting or a severe headache please notify your doctor immediately.   FOLLOW-UP: You do not need to follow up with anesthesia unless specifically instructed to do so.   WOUND CARE INSTRUCTIONS (if applicable): Expect some mild vaginal bleeding, but if large amount of bleeding occurs please contact Dr. Sondra Come at [phone] or the Radiation On-Call physician. Call for any fever greater than 101.0 degrees or increasing vaginal//abdominal pain or trouble urinating.   QUESTIONS?: Please feel free to call your physician or the hospital operator if you have any questions, and they will be happy to assist you. Resume all medications: as listed on your after visit summary. Your next appointment is:  Future Appointments  Date Time Provider [place]  05/16/2020  7:30 AM WL-US 2 WL-US [place]  05/16/2020 10:00 AM Gery Pray, MD [facility] None  05/16/2020 11:00 AM Gery Pray, MD [facility] None  05/29/2020 12:00 PM [facility] None  05/29/2020 12:15 PM [facility] [place] [facility] None  05/29/2020 12:40 PM Heath Lark, MD [facility] None

## 2020-05-09 NOTE — Interval H&P Note (Signed)
History and Physical Interval Note:  05/09/2020 7:31 AM  Madeline Howard  has presented today for surgery, with the diagnosis of ENDOCERVIX.  The various methods of treatment have been discussed with the patient and family. After consideration of risks, benefits and other options for treatment, the patient has consented to  Procedure(s): TANDEM RING INSERTION (N/A) OPERATIVE ULTRASOUND (N/A) as a surgical intervention.  The patient's history has been reviewed, patient examined, no change in status, stable for surgery.  I have reviewed the patient's chart and labs.  Questions were answered to the patient's satisfaction.     Gery Pray

## 2020-05-09 NOTE — Anesthesia Procedure Notes (Signed)
Procedure Name: LMA Insertion Date/Time: 05/09/2020 7:40 AM Performed by: Rogers Blocker, CRNA Pre-anesthesia Checklist: Patient identified, Emergency Drugs available, Suction available and Patient being monitored Patient Re-evaluated:Patient Re-evaluated prior to induction Oxygen Delivery Method: Circle System Utilized Preoxygenation: Pre-oxygenation with 100% oxygen Induction Type: IV induction Ventilation: Mask ventilation without difficulty LMA: LMA inserted LMA Size: 4.0 Number of attempts: 1 Placement Confirmation: positive ETCO2 Tube secured with: Tape Dental Injury: Teeth and Oropharynx as per pre-operative assessment

## 2020-05-10 ENCOUNTER — Encounter (HOSPITAL_BASED_OUTPATIENT_CLINIC_OR_DEPARTMENT_OTHER): Payer: Self-pay | Admitting: Radiation Oncology

## 2020-05-10 ENCOUNTER — Other Ambulatory Visit: Payer: Self-pay | Admitting: Hematology and Oncology

## 2020-05-10 ENCOUNTER — Other Ambulatory Visit: Payer: Self-pay

## 2020-05-10 MED ORDER — OXYCODONE HCL 5 MG PO TABS: 5.0000 mg | ORAL_TABLET | Freq: Four times a day (QID) | ORAL | 0 refills | Status: DC | PRN

## 2020-05-10 NOTE — Telephone Encounter (Signed)
Dr. Lindi Adie sent 20 tablets on 4/1.

## 2020-05-10 NOTE — Progress Notes (Addendum)
Spoke w/ via phone for pre-op interview---pt Lab needs dos----  I stat (per anesthesia)             Lab results------ekg 04-18-2020 epic COVID test ------05-13-2020 1400 Arrive at -------530 am 05-16-2020 NPO after MN NO Solid Food.  Clear liquids from MN until---430 am then npo Med rec completed Medications to take morning of surgery -----pepcid Diabetic medication -----n/a Patient instructed to bring photo id and insurance card day of surgery Patient aware to have Driver (ride ) / caregiver  Daughter lyndzie will drop pt off   for 24 hours after surgery  Patient Special Instructions -----none Pre-Op special Istructions -----none Patient verbalized understanding of instructions that were given at this phone interview. Patient denies shortness of breath, chest pain, fever, cough at this phone interview.  req orders with shirley

## 2020-05-13 ENCOUNTER — Other Ambulatory Visit (HOSPITAL_COMMUNITY)
Admission: RE | Admit: 2020-05-13 | Discharge: 2020-05-13 | Disposition: A | Discharge status: Home / Self Care | Payer: Medicaid Other | Source: Ambulatory Visit | Attending: Radiation Oncology | Admitting: Radiation Oncology

## 2020-05-13 ENCOUNTER — Other Ambulatory Visit: Payer: Self-pay | Admitting: Hematology and Oncology

## 2020-05-13 DIAGNOSIS — Z01812 Encounter for preprocedural laboratory examination: Secondary | ICD-10-CM | POA: Diagnosis not present

## 2020-05-13 DIAGNOSIS — Z20822 Contact with and (suspected) exposure to covid-19: Secondary | ICD-10-CM | POA: Diagnosis not present

## 2020-05-13 LAB — SARS CORONAVIRUS 2 (TAT 6-24 HRS): SARS Coronavirus 2: NEGATIVE

## 2020-05-16 ENCOUNTER — Ambulatory Visit
Admission: RE | Admit: 2020-05-16 | Discharge: 2020-05-16 | Disposition: A | Discharge status: Home / Self Care | Payer: Medicaid Other | Source: Ambulatory Visit | Attending: Radiation Oncology | Admitting: Radiation Oncology

## 2020-05-16 ENCOUNTER — Encounter (HOSPITAL_BASED_OUTPATIENT_CLINIC_OR_DEPARTMENT_OTHER): Payer: Self-pay | Admitting: Radiation Oncology

## 2020-05-16 ENCOUNTER — Ambulatory Visit (HOSPITAL_BASED_OUTPATIENT_CLINIC_OR_DEPARTMENT_OTHER): Payer: Medicaid Other | Admitting: Anesthesiology

## 2020-05-16 ENCOUNTER — Ambulatory Visit (HOSPITAL_BASED_OUTPATIENT_CLINIC_OR_DEPARTMENT_OTHER)
Admission: RE | Admit: 2020-05-16 | Discharge: 2020-05-16 | Disposition: A | Discharge status: Other Acute Inpatient Hospital | Payer: Medicaid Other | Attending: Radiation Oncology | Admitting: Radiation Oncology

## 2020-05-16 ENCOUNTER — Encounter (HOSPITAL_BASED_OUTPATIENT_CLINIC_OR_DEPARTMENT_OTHER)
Admission: RE | Disposition: A | Discharge status: Other Acute Inpatient Hospital | Payer: Self-pay | Source: Home / Self Care | Attending: Radiation Oncology

## 2020-05-16 ENCOUNTER — Encounter: Payer: Self-pay | Admitting: Radiation Oncology

## 2020-05-16 ENCOUNTER — Ambulatory Visit (HOSPITAL_COMMUNITY)
Admission: RE | Admit: 2020-05-16 | Discharge: 2020-05-16 | Disposition: A | Discharge status: Home / Self Care | Payer: Medicaid Other | Source: Ambulatory Visit | Attending: Radiation Oncology | Admitting: Radiation Oncology

## 2020-05-16 VITALS — BP 113/64 | HR 66 | Temp 98.2°F | Resp 16

## 2020-05-16 DIAGNOSIS — C530 Malignant neoplasm of endocervix: Secondary | ICD-10-CM

## 2020-05-16 DIAGNOSIS — C539 Malignant neoplasm of cervix uteri, unspecified: Secondary | ICD-10-CM | POA: Insufficient documentation

## 2020-05-16 DIAGNOSIS — Z87891 Personal history of nicotine dependence: Secondary | ICD-10-CM | POA: Insufficient documentation

## 2020-05-16 HISTORY — PX: TANDEM RING INSERTION: SHX6199

## 2020-05-16 HISTORY — PX: OPERATIVE ULTRASOUND: SHX5996

## 2020-05-16 LAB — POCT I-STAT, CHEM 8
BUN: 6 mg/dL (ref 6–20)
Calcium, Ion: 1.26 mmol/L (ref 1.15–1.40)
Chloride: 104 mmol/L (ref 98–111)
Creatinine, Ser: 0.5 mg/dL (ref 0.44–1.00)
Glucose, Bld: 95 mg/dL (ref 70–99)
HCT: 38 % (ref 36.0–46.0)
Hemoglobin: 12.9 g/dL (ref 12.0–15.0)
Potassium: 3.9 mmol/L (ref 3.5–5.1)
Sodium: 141 mmol/L (ref 135–145)
TCO2: 23 mmol/L (ref 22–32)

## 2020-05-16 SURGERY — INSERTION, UTERINE TANDEM AND RING OR CYLINDER, FOR BRACHYTHERAPY
Anesthesia: General | Site: Vagina

## 2020-05-16 MED ORDER — LACTATED RINGERS IV SOLN
Freq: Once | INTRAVENOUS | Status: AC
  Filled 2020-05-16: qty 250

## 2020-05-16 MED ORDER — SODIUM CHLORIDE 0.9 % IR SOLN
Status: DC | PRN
  Administered 2020-05-16: 300 mL via INTRAVESICAL

## 2020-05-16 MED ORDER — KETOROLAC TROMETHAMINE 30 MG/ML IJ SOLN
INTRAMUSCULAR | Status: DC | PRN
  Administered 2020-05-16: 30 mg via INTRAVENOUS

## 2020-05-16 MED ORDER — KETOROLAC TROMETHAMINE 30 MG/ML IJ SOLN
INTRAMUSCULAR | Status: AC
  Filled 2020-05-16: qty 1

## 2020-05-16 MED ORDER — MIDAZOLAM HCL 5 MG/5ML IJ SOLN
INTRAMUSCULAR | Status: DC | PRN
  Administered 2020-05-16: 1 mg via INTRAVENOUS

## 2020-05-16 MED ORDER — MIDAZOLAM HCL 2 MG/2ML IJ SOLN
INTRAMUSCULAR | Status: AC
  Filled 2020-05-16: qty 2

## 2020-05-16 MED ORDER — ONDANSETRON HCL 4 MG/2ML IJ SOLN
INTRAMUSCULAR | Status: AC
  Filled 2020-05-16: qty 2

## 2020-05-16 MED ORDER — FENTANYL CITRATE (PF) 100 MCG/2ML IJ SOLN
INTRAMUSCULAR | Status: DC | PRN
  Administered 2020-05-16 (×2): 50 ug via INTRAVENOUS

## 2020-05-16 MED ORDER — ARTIFICIAL TEARS OPHTHALMIC OINT
TOPICAL_OINTMENT | OPHTHALMIC | Status: AC
  Filled 2020-05-16: qty 3.5

## 2020-05-16 MED ORDER — DEXAMETHASONE SODIUM PHOSPHATE 10 MG/ML IJ SOLN
INTRAMUSCULAR | Status: DC | PRN
  Administered 2020-05-16: 10 mg via INTRAVENOUS

## 2020-05-16 MED ORDER — LACTATED RINGERS IV SOLN: INTRAVENOUS | Status: DC

## 2020-05-16 MED ORDER — ONDANSETRON HCL 4 MG/2ML IJ SOLN: 4.0000 mg | Freq: Once | INTRAMUSCULAR | Status: DC | PRN

## 2020-05-16 MED ORDER — PROPOFOL 10 MG/ML IV BOLUS
INTRAVENOUS | Status: DC | PRN
  Administered 2020-05-16: 200 mg via INTRAVENOUS

## 2020-05-16 MED ORDER — FENTANYL CITRATE (PF) 100 MCG/2ML IJ SOLN: 25.0000 ug | INTRAMUSCULAR | Status: DC | PRN

## 2020-05-16 MED ORDER — ONDANSETRON HCL 4 MG/2ML IJ SOLN
INTRAMUSCULAR | Status: DC | PRN
  Administered 2020-05-16: 4 mg via INTRAVENOUS

## 2020-05-16 MED ORDER — LIDOCAINE 2% (20 MG/ML) 5 ML SYRINGE
INTRAMUSCULAR | Status: DC | PRN
  Administered 2020-05-16: 100 mg via INTRAVENOUS

## 2020-05-16 MED ORDER — LIDOCAINE 2% (20 MG/ML) 5 ML SYRINGE
INTRAMUSCULAR | Status: AC
  Filled 2020-05-16: qty 5

## 2020-05-16 MED ORDER — PROPOFOL 10 MG/ML IV BOLUS
INTRAVENOUS | Status: AC
  Filled 2020-05-16: qty 20

## 2020-05-16 MED ORDER — DEXAMETHASONE SODIUM PHOSPHATE 10 MG/ML IJ SOLN
INTRAMUSCULAR | Status: AC
  Filled 2020-05-16: qty 1

## 2020-05-16 MED ORDER — FENTANYL CITRATE (PF) 100 MCG/2ML IJ SOLN
INTRAMUSCULAR | Status: AC
  Filled 2020-05-16: qty 2

## 2020-05-16 MED ORDER — POVIDONE-IODINE 10 % EX SWAB: 2.0000 "application " | Freq: Once | CUTANEOUS | Status: DC

## 2020-05-16 MED ORDER — ESTRADIOL 0.1 MG/GM VA CREA
TOPICAL_CREAM | VAGINAL | Status: DC | PRN
  Administered 2020-05-16: 1 via VAGINAL

## 2020-05-16 SURGICAL SUPPLY — 21 items
BNDG CONFORM 2 STRL LF (GAUZE/BANDAGES/DRESSINGS) IMPLANT
COVER WAND RF STERILE (DRAPES) ×2 IMPLANT
DILATOR CANAL MILEX (MISCELLANEOUS) IMPLANT
DRSG PAD ABDOMINAL 8X10 ST (GAUZE/BANDAGES/DRESSINGS) ×2 IMPLANT
GAUZE 4X4 16PLY RFD (DISPOSABLE) IMPLANT
GLOVE SURG ENC MOIS LTX SZ7.5 (GLOVE) ×4 IMPLANT
GLOVE SURG UNDER POLY LF SZ6.5 (GLOVE) ×4 IMPLANT
GOWN STRL REUS W/ TWL LRG LVL3 (GOWN DISPOSABLE) ×1 IMPLANT
GOWN STRL REUS W/TWL LRG LVL3 (GOWN DISPOSABLE) ×4 IMPLANT
HOLDER FOLEY CATH W/STRAP (MISCELLANEOUS) ×2 IMPLANT
IV NS 1000ML (IV SOLUTION) ×2
IV NS 1000ML BAXH (IV SOLUTION) ×1 IMPLANT
IV SET EXTENSION GRAVITY 40 LF (IV SETS) ×2 IMPLANT
KIT TURNOVER CYSTO (KITS) ×2 IMPLANT
MAT PREVALON FULL STRYKER (MISCELLANEOUS) ×2 IMPLANT
PACK VAGINAL MINOR WOMEN LF (CUSTOM PROCEDURE TRAY) ×2 IMPLANT
PACKING VAGINAL (PACKING) IMPLANT
PAD OB MATERNITY 4.3X12.25 (PERSONAL CARE ITEMS) IMPLANT
TOWEL OR 17X26 10 PK STRL BLUE (TOWEL DISPOSABLE) ×2 IMPLANT
TRAY FOLEY W/BAG SLVR 14FR LF (SET/KITS/TRAYS/PACK) ×2 IMPLANT
WATER STERILE IRR 500ML POUR (IV SOLUTION) ×2 IMPLANT

## 2020-05-16 NOTE — Interval H&P Note (Signed)
History and Physical Interval Note:  05/16/2020 7:35 AM  Madeline Howard  has presented today for surgery, with the diagnosis of ENDOCERVIX.  The various methods of treatment have been discussed with the patient and family. After consideration of risks, benefits and other options for treatment, the patient has consented to  Procedure(s): TANDEM RING INSERTION (N/A) OPERATIVE ULTRASOUND (N/A) as a surgical intervention.  The patient's history has been reviewed, patient examined, no change in status, stable for surgery.  I have reviewed the patient's chart and labs.  Questions were answered to the patient's satisfaction.     Gery Pray

## 2020-05-16 NOTE — Anesthesia Postprocedure Evaluation (Signed)
Anesthesia Post Note  Patient: Madeline Howard  Procedure(s) Performed: TANDEM RING INSERTION (N/A Vagina ) OPERATIVE ULTRASOUND (N/A Vagina )     Patient location during evaluation: PACU Anesthesia Type: General Level of consciousness: awake and alert Pain management: pain level controlled Vital Signs Assessment: post-procedure vital signs reviewed and stable Respiratory status: spontaneous breathing, nonlabored ventilation, respiratory function stable and patient connected to nasal cannula oxygen Cardiovascular status: blood pressure returned to baseline and stable Postop Assessment: no apparent nausea or vomiting Anesthetic complications: no   No complications documented.  Last Vitals:  Vitals:   05/16/20 0557 05/16/20 0815  BP: 129/67 109/67  Pulse: 77 67  Resp: 16 13  Temp: (!) 36.3 C 36.4 C  SpO2: 100% 95%    Last Pain:  Vitals:   05/16/20 0815  PainSc: 0-No pain                 Kentrell Guettler S

## 2020-05-16 NOTE — H&P (Signed)
Gery Pray, MD  Physician  Radiation Oncology  Progress Notes    Addendum  Date of Service:  02/07/2020 2:00 PM           Expand AllCollapse All      Show:Clear all [x] Manual[x] Template[] Copied  Added by: [x] Kahn, Maleeha[x] Cyrena Kuchenbecker, MD   [] Hover for details    Radiation Oncology         [phone]) [phone] ________________________________  History and physical  Name: Madeline Howard           MRN: [id number] [id number]         Date:05/16/20                  DOB: 1965-11-20  22/03/1965, No Pcp Per  OE:HOZYYQM, MD   REFERRING PHYSICIAN: [name], MD  DIAGNOSIS: The encounter diagnosis was Malignant neoplasm of endocervix [facility]).  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 female who is seen as a courtesy of Dr. 47 for an opinion concerning radiation therapy as part of management for her recently diagnosed cervical cancer. Today, she is accompanied by no one. The patient presented to Dr. Berline Lopes on 12/28/2019 for evaluation of postmenopausal bleeding with cramping. An endometrial biopsy was performed at that time and revealed poorly differentiated carcinoma. A PAP smear was also performed and show highly dysplastic squamous cells c/w squamous cell carcinoma. 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   Given the above findings, the patient was referred to Dr. 01/10/2020 and was seen in consultation on 01/12/2020. A cervical biopsy was taken at that time that showed invasive squamous cell carcinoma arising in the background of high-grade dysplasia. Endocervix curettage was also performed and showed high grade squamous intraepithelial lesion (CIN 3; severe dysplasia/carcinoma in situ).  The patient underwent a EUA, multiple cervical biopsies, ECC, and EMB on 01/17/2020. Final pathology revealed high grade squamous intraepithelial lesion, CIN-II/VAIN-11 (moderate dysplasia) of the  vaginocervical junction at the 6 o'clock position and high grade intraepithelial lesion, CIN-III/VAIN III (severe dysplasia/CIS) at the 4 o'clock position. Invasive squamous cell carcinoma was also seen at the 12 o'clock position of the vaginocervical junction. Endometrium curettage showed predominantly blood with fragments of high grade squamous intraepithelial lesion. Endocervix curettage showed invasive squamous cell carcinoma.  Exam by Dr. 25/09/2019 was concerning for parametrial involvement.  CT scan of abdomen and pelvis on 01/26/2020 did not appreciate the known cervical malignancy. There was no evidence of lymphadenopathy or metastatic disease in the abdomen or pelvis. There was, however, a probable fibroid of the anterior uterine body that was not well delineated by CT.  The patient was seen by Dr. 02/08/2020 on 01/29/2020, during which time they discussed proceeding with concurrent chemoradiation therapy with weekly Cisplatin.  The patient's case was discussed at the Gynecologic Oncology Multi-Disciplinary Conference on 02/05/2020. It was recommended that she proceed with MRI followed by definitive treatment with radiation and chemotherapy or consideration for surgery.  If the patient's MRI does not show any obvious parametrial extension then she would be a candidate for radical hysterectomy  PREVIOUS RADIATION THERAPY: No  PAST MEDICAL HISTORY:  Past Medical History:  Diagnosis Date  . Allergy   . GERD (gastroesophageal reflux disease)     PAST SURGICAL HISTORY:History reviewed. No pertinent surgical history.  FAMILY HISTORY:       Family History  Problem Relation Age of Onset  . Cancer Mother  colon  . Diabetes Father   . Cancer Brother        melanoma  . Breast cancer Neg Hx   . Uterine cancer Neg Hx   . Ovarian cancer Neg Hx     SOCIAL HISTORY:  Social History        Tobacco Use  . Smoking status: Former Smoker    Packs/day: 1.50     Years: 15.00    Pack years: 22.50    Types: Cigarettes, E-cigarettes    Quit date: 07/15/2016    Years since quitting: 3.5  . Smokeless tobacco: Never Used  Vaping Use  . Vaping Use: Every day  . Start date: 07/15/2016  . Substances: Nicotine, Flavoring  Substance Use Topics  . Alcohol use: Never  . Drug use: Never    ALLERGIES: No Known Allergies  MEDICATIONS:        Current Outpatient Medications  Medication Sig Dispense Refill  . Chlorpheniramine Maleate (ALLERGY PO) Take 1 tablet by mouth daily.     . famotidine (PEPCID) 20 MG tablet Take 20 mg by mouth daily. OTC    . lidocaine-prilocaine (EMLA) cream Apply to affected area once 30 g 3  . Multiple Vitamin (MULTIVITAMIN) capsule Take 1 capsule by mouth daily.    . ondansetron (ZOFRAN) 8 MG tablet Take 1 tablet (8 mg total) by mouth every 8 (eight) hours as needed. Start on the third day after cisplatin chemotherapy. 30 tablet 1  . prochlorperazine (COMPAZINE) 10 MG tablet Take 1 tablet (10 mg total) by mouth every 6 (six) hours as needed (Nausea or vomiting). 30 tablet 1   No current facility-administered medications for this encounter.    REVIEW OF SYSTEMS:  A 10+ POINT REVIEW OF SYSTEMS WAS OBTAINED including neurology, dermatology, psychiatry, cardiac, respiratory, lymph, extremities, GI, GU, musculoskeletal, constitutional, reproductive, HEENT.  She denies any pelvic pain urination difficulties or bowel complaints.  She denies any vaginal bleeding.  In retrospect she did notice some mild postcoital bleeding over the past few months.   PHYSICAL EXAM:  height is 5\' 7"  (1.702 m) and weight is 158 lb 12.8 oz (72 kg). Her temperature is 97.7 F (36.5 C). Her blood pressure is 174/84 (abnormal) and her pulse is 73. Her respiration is 18 and oxygen saturation is 100%.   General: Alert and oriented, in no acute distress HEENT: Head is normocephalic. Extraocular movements are intact. Oropharynx is clear. Neck: Neck  is supple, no palpable cervical or supraclavicular lymphadenopathy. Heart: Regular in rate and rhythm with no murmurs, rubs, or gallops. Chest: Clear to auscultation bilaterally, with no rhonchi, wheezes, or rales. Abdomen: Soft, nontender, nondistended, with no rigidity or guarding. Extremities: No cyanosis or edema. Lymphatics: see Neck Exam Skin: No concerning lesions. Musculoskeletal: symmetric strength and muscle tone throughout. Neurologic: Cranial nerves II through XII are grossly intact. No obvious focalities. Speech is fluent. Coordination is intact. Psychiatric: Judgment and insight are intact. Affect is appropriate. Pelvic exam deferred until simulation and planning day.  As above the patient underwent an exam under anesthesia by Dr. Berline Lopes revealing the past possible examination revealing potential parametrial extension  ECOG = 1  0 - Asymptomatic (Fully active, able to carry on all predisease activities without restriction)  1 - Symptomatic but completely ambulatory (Restricted in physically strenuous activity but ambulatory and able to carry out work of a light or sedentary nature. For example, light housework, office work)  2 - Symptomatic, <50% in bed during the day (Ambulatory and capable  of all self care but unable to carry out any work activities. Up and about more than 50% of waking hours)  3 - Symptomatic, >50% in bed, but not bedbound (Capable of only limited self-care, confined to bed or chair 50% or more of waking hours)  4 - Bedbound (Completely disabled. Cannot carry on any self-care. Totally confined to bed or chair)  5 - Death   Eustace Pen MM, Creech RH, Tormey DC, et al. [phone]). "Toxicity and response criteria of the [facility]". [place] Oncol. 5 (6): 649-55  LABORATORY DATA:  Recent Labs       Lab Results  Component Value Date   WBC 6.8 01/15/2020   HGB 12.4 01/15/2020   HCT 37.7 01/15/2020   MCV 96.4 01/15/2020    PLT 299 01/15/2020     Recent Labs       Lab Results  Component Value Date   NA 141 01/12/2020   K 4.3 01/12/2020   CL 106 01/12/2020   CO2 24 01/12/2020   GLUCOSE 92 01/12/2020   CREATININE 0.70 01/12/2020   CALCIUM 10.1 01/12/2020        RADIOGRAPHY:  Imaging Results  CT Abdomen Pelvis W Contrast  Result Date: 01/29/2020 CLINICAL DATA:  New diagnosis cervical cancer EXAM: CT ABDOMEN AND PELVIS WITH CONTRAST TECHNIQUE: Multidetector CT imaging of the abdomen and pelvis was performed using the standard protocol following bolus administration of intravenous contrast. CONTRAST:  168mL OMNIPAQUE IOHEXOL 300 MG/ML SOLN, additional oral enteric contrast COMPARISON:  None. FINDINGS: Lower chest: No acute abnormality. Hepatobiliary: No solid liver abnormality is seen. No gallstones, gallbladder wall thickening, or biliary dilatation. Pancreas: Unremarkable. No pancreatic ductal dilatation or surrounding inflammatory changes. Spleen: Normal in size without significant abnormality. Adrenals/Urinary Tract: Adrenal glands are unremarkable. Kidneys are normal, without renal calculi, solid lesion, or hydronephrosis. Bladder is unremarkable. Stomach/Bowel: Stomach is within normal limits. Appendix appears normal. No evidence of bowel wall thickening, distention, or inflammatory changes. Vascular/Lymphatic: Aortic atherosclerosis. No enlarged abdominal or pelvic lymph nodes. Reproductive: Probable fibroid of the anterior uterine body, not well delineated by CT (series 6, image 51). Known cervical malignancy is not well appreciated. Other: No abdominal wall hernia or abnormality. No abdominopelvic ascites. Musculoskeletal: No acute or significant osseous findings. IMPRESSION: 1. Known cervical malignancy is not well appreciated. There is no evidence of lymphadenopathy or metastatic disease in the abdomen or pelvis. 2. Probable fibroid of the anterior uterine body, not well delineated by CT. Aortic  Atherosclerosis (ICD10-I70.0). Electronically Signed   By: Eddie Candle M.D.   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  If disease limited to the cervix then she would be a potential candidate for radical hysterectomy. If Pelvic MRI shows parametrial extension then she would be better served with a definitive course of radiation therapy along with radiosensitizing chemotherapy.   PLAN: final HDR procedure today to complete therapy. Tandem and ring planned with Iridium 192.  ------------------------------------------------  Blair Promise, PhD, MD  This document serves as a record of services personally performed by Gery Pray, MD. It was created on his behalf by Clerance Lav, a trained medical scribe. The creation of this record is based on the scribe's personal observations and the provider's statements to them. This document has  been checked and approved by the attending provider.                                 Detailed Report       Note shared with patient    Additional Documentation  Vitals:  BP 174/84Important (BP Location: Left Arm, Patient Position: Sitting, Cuff Size: Normal)   Pulse 73   Temp 97.7 F (36.5 C)   Resp 18   Ht 5\' 7"  (1.702 m)   Wt 158 lb 12.8 oz (72 kg)   SpO2 100%   BMI 24.87 kg/m   BSA 1.84 m   Pain [place] 0-No pain       More Vitals   Flowsheets:  Vital Signs,   NEWS,   MEWS Score,   Anthropometrics,   Infectious Disease Screening,   Healthcare Directives,   ONC Fall Risk Screening,   Distress Screening Tool     Encounter Info:  Billing Info,   History,   Allergies,   Detailed Report       Linked Episodes   ONCOLOGY TREATMENT Noted 01/29/2020     Orders  Placed   None   Medication Changes     None    Medication List   Visit Diagnoses     Malignant neoplasm of endocervix ([place])    Problem List

## 2020-05-16 NOTE — Anesthesia Preprocedure Evaluation (Signed)
Anesthesia Evaluation  Patient identified by MRN, date of birth, ID band Patient awake    Reviewed: Allergy & Precautions, NPO status , Patient's Chart, lab work & pertinent test results  Airway Mallampati: II  TM Distance: >3 FB Neck ROM: Full    Dental no notable dental hx.    Pulmonary neg pulmonary ROS, former smoker,    Pulmonary exam normal breath sounds clear to auscultation       Cardiovascular hypertension, Normal cardiovascular exam Rhythm:Regular Rate:Normal     Neuro/Psych negative neurological ROS  negative psych ROS   GI/Hepatic Neg liver ROS, GERD  Medicated,  Endo/Other  negative endocrine ROS  Renal/GU negative Renal ROS  negative genitourinary   Musculoskeletal negative musculoskeletal ROS (+)   Abdominal   Peds negative pediatric ROS (+)  Hematology negative hematology ROS (+)   Anesthesia Other Findings   Reproductive/Obstetrics negative OB ROS                             Anesthesia Physical Anesthesia Plan  ASA: II  Anesthesia Plan: General   Post-op Pain Management:    Induction: Intravenous  PONV Risk Score and Plan: 3 and Ondansetron, Dexamethasone, Midazolam and Treatment may vary due to age or medical condition  Airway Management Planned: LMA  Additional Equipment:   Intra-op Plan:   Post-operative Plan: Extubation in OR  Informed Consent: I have reviewed the patients History and Physical, chart, labs and discussed the procedure including the risks, benefits and alternatives for the proposed anesthesia with the patient or authorized representative who has indicated his/her understanding and acceptance.     Dental advisory given  Plan Discussed with: CRNA and Surgeon  Anesthesia Plan Comments:         Anesthesia Quick Evaluation

## 2020-05-16 NOTE — Transfer of Care (Signed)
Immediate Anesthesia Transfer of Care Note  Patient: Madeline Howard  Procedure(s) Performed: TANDEM RING INSERTION (N/A Vagina ) OPERATIVE ULTRASOUND (N/A Vagina )  Patient Location: PACU  Anesthesia Type:General  Level of Consciousness: awake, alert , oriented and patient cooperative  Airway & Oxygen Therapy: Patient Spontanous Breathing and Patient connected to face mask oxygen  Post-op Assessment: Report given to RN and Post -op Vital signs reviewed and stable  Post vital signs: Reviewed and stable  Last Vitals:  Vitals Value Taken Time  BP    Temp    Pulse 64 05/16/20 0817  Resp 14 05/16/20 0817  SpO2 95 % 05/16/20 0817  Vitals shown include unvalidated device data.  Last Pain:  Vitals:   05/16/20 0557  PainSc: 0-No pain      Patients Stated Pain Goal: 4 (26/94/85 4627)  Complications: No complications documented.

## 2020-05-16 NOTE — Op Note (Signed)
05/16/2020  8:52 AM  PATIENT:  Madeline Howard  55 y.o. female  PRE-OPERATIVE DIAGNOSIS:  ENDOCERVIX  POST-OPERATIVE DIAGNOSIS:  ENDOCERVIX  PROCEDURE:  Procedure(s): TANDEM RING INSERTION (N/A) OPERATIVE ULTRASOUND (N/A)  SURGEON:  Surgeon(s) and Role:    * Gery Pray, MD - Primary  PHYSICIAN ASSISTANT:   ASSISTANTS: none   ANESTHESIA:   general  EBL:  2 mL   BLOOD ADMINISTERED:none  DRAINS: Urinary Catheter (Foley)   LOCAL MEDICATIONS USED:  NONE  SPECIMEN:  No Specimen  DISPOSITION OF SPECIMEN:  N/A  COUNTS:  YES  TOURNIQUET:  * No tourniquets in log *  DICTATION: The patient was taken to outpatient OR #1. Timeout was performed for the procedure including allergies, preoperative antibiotics, pain medications and procedure. The patient was prepped and draped in the usual sterile fashion and placed in the dorsolithotomy position. A Foley catheter was placed without difficulty. To improve ultrasound imaging the bladder was backfilled with approximately250cc of sterile water. Patient then proceeded to undergo a pelvic exam under anesthesia. The external genitalia was unremarkable. No mucosal lesions noted in the vaginal vault. The cervical os was identified and some erythema, slight necrosis to the posterior lip  consistent with radiation effect. On bimanual examination the vaginal fornices were present. The cervical mass had decreased significantly in size. Pretreatment measurements per imaging and exam was approximately 6.5 cm with right parametrial extension. Exam today reveals the cervical region to measure approximately 3 x  2.5 cm in size. No obvious parametrial extension.The cervical sleeve placed last week remained in position and therefore no dilation of the cervical os was needed.Ultrasound images were excellent.The uterus was noted to be in a mildly anteverted position.  she then had placement of a 68mm tandem(45 degree). Attached to this  tandemwas the cervical ring(45 degree)with a large shielding cap in place.  Screws were missing for the rectal paddle and therefore the rectal paddle could not be used.  Instead the patient had vaginal packing soaked in Estrace  cream placed along the vaginal area to push rectal mucosa and bladder as far out of the high-dose radiation field as possible.Ultrasound at the completion of the placement showed accurate position for treatment.the patient tolerated the procedure well. She was subsequently taken to the recoveryroom in stablecondition. Later in the day the patient will be transported to the radiation oncology department for CT simulation with tandem and ring in place as well as planning for high-dose-rate radiation therapy and her5thhigh-dose-rate treatment. Plan is for the patient to receive 5.5 Gy to the high risk clinicaltarget volume (HRCTV). Iridium 192 will be the high-dose-rate source. She is scheduled to receive a total of 5 high-dose-rate treatments which will to complete her definitive course of therapy.  PLAN OF CARE: Transferred to radiation oncology for planning and treatment  PATIENT DISPOSITION:  PACU - hemodynamically stable.   Delay start of Pharmacological VTE agent (>24hrs) due to surgical blood loss or risk of bleeding: not applicable

## 2020-05-16 NOTE — Progress Notes (Signed)
  Radiation Oncology         [phone] ________________________________  Name: Madeline Howard MRN: [id number]  Date: 05/16/2020  DOB: 10/08/1965  CC: Patient, [name]  [name], MD  HDR BRACHYTHERAPY NOTE  DIAGNOSIS: Stage IIb squamous cell carcinoma of the cervix  NARRATIVE: The patient was brought to the [facility] suite. Identity was confirmed. All relevant records and images related to the planned course of therapy were reviewed. The patient freely provided informed written consent to proceed with treatment after reviewing the details related to the planned course of therapy. The consent form was witnessed and verified by the simulation staff. Then, the patient was set-up in a stable reproducible supine position for radiation therapy. The tandem ring system was accessed and fiducial markers were placed within the tandem and ring.   Simple treatment device note: On the operating room the patient had construction of her custom tandem ring system. She will be treated with a 45 tandem/ring system. The patient had placement of a 60 mm tandem. A cervical ring with a large shielding cap was used for her treatment.  Screws used to attach the rectal paddle were lost during the sterilization process and therefore packing was placed in the vaginal vault soaked in Estrace cream to make up for the rectal paddle not being in place.  Verification simulation note: An AP and lateral film was obtained through the pelvis area. This was compared to the patient's planning films documenting accurate position of the tandem/ring system for treatment.  High-dose-rate brachytherapy treatment note:  The remote afterloading device was accessed through catheter system and attached to the tandem ring system. Patient then proceeded to undergo her fifth high-dose-rate treatment directed at the cervix. The patient was prescribed a dose of 5.5 gray to be delivered to the high risk clinical target volume.  Patient was treated with 2 channels using 22 dwell positions. Treatment time was 689.5 seconds. The patient tolerated the procedure well. After completion of her therapy, a radiation survey was performed documenting return of the iridium source into the [employer] safe. The patient was then transferred to the nursing suite. She then had removal of the rectal paddle followed by the tandem and ring system. The patient tolerated the removal well.  The patient tolerated the overall procedure well.   PLAN: Patient will return for routine follow-up in one month.  She has completed the definitive course of radiation therapy along with radiosensitizing chemotherapy. ________________________________    Blair Promise, PhD, MD  This document serves as a record of services personally performed by Gery Pray, MD. It was created on his behalf by Clerance Lav, a trained medical scribe. The creation of this record is based on the scribe's personal observations and the provider's statements to them. This document has been checked and approved by the attending provider.

## 2020-05-16 NOTE — Anesthesia Procedure Notes (Signed)
Procedure Name: LMA Insertion Date/Time: 05/16/2020 7:41 AM Performed by: Rogers Blocker, CRNA Pre-anesthesia Checklist: Patient identified, Emergency Drugs available, Suction available and Patient being monitored Patient Re-evaluated:Patient Re-evaluated prior to induction Oxygen Delivery Method: Circle System Utilized Preoxygenation: Pre-oxygenation with 100% oxygen Induction Type: IV induction Ventilation: Mask ventilation without difficulty LMA: LMA inserted LMA Size: 4.0 Number of attempts: 1 Placement Confirmation: positive ETCO2 Tube secured with: Tape Dental Injury: Teeth and Oropharynx as per pre-operative assessment

## 2020-05-17 ENCOUNTER — Encounter (HOSPITAL_BASED_OUTPATIENT_CLINIC_OR_DEPARTMENT_OTHER): Payer: Self-pay | Admitting: Radiation Oncology

## 2020-05-22 ENCOUNTER — Telehealth: Payer: Self-pay | Admitting: *Deleted

## 2020-05-22 NOTE — Telephone Encounter (Signed)
RETURNED PATIENT'S PHONE CALL, SPOKE WITH PATIENT. ?

## 2020-05-29 ENCOUNTER — Other Ambulatory Visit: Payer: Self-pay

## 2020-05-29 ENCOUNTER — Inpatient Hospital Stay: Payer: Medicaid Other

## 2020-05-29 ENCOUNTER — Encounter: Payer: Self-pay | Admitting: Hematology and Oncology

## 2020-05-29 ENCOUNTER — Inpatient Hospital Stay: Payer: Medicaid Other | Attending: Gynecologic Oncology | Admitting: Hematology and Oncology

## 2020-05-29 VITALS — BP 141/66 | HR 72 | Temp 98.8°F | Resp 18 | Ht 66.0 in | Wt 161.6 lb

## 2020-05-29 DIAGNOSIS — C539 Malignant neoplasm of cervix uteri, unspecified: Secondary | ICD-10-CM

## 2020-05-29 DIAGNOSIS — I10 Essential (primary) hypertension: Secondary | ICD-10-CM | POA: Diagnosis not present

## 2020-05-29 DIAGNOSIS — Z7289 Other problems related to lifestyle: Secondary | ICD-10-CM | POA: Diagnosis not present

## 2020-05-29 DIAGNOSIS — D6481 Anemia due to antineoplastic chemotherapy: Secondary | ICD-10-CM

## 2020-05-29 DIAGNOSIS — T451X5A Adverse effect of antineoplastic and immunosuppressive drugs, initial encounter: Secondary | ICD-10-CM

## 2020-05-29 LAB — CBC WITH DIFFERENTIAL (CANCER CENTER ONLY)
Abs Immature Granulocytes: 0.01 10*3/uL (ref 0.00–0.07)
Basophils Absolute: 0 10*3/uL (ref 0.0–0.1)
Basophils Relative: 1 %
Eosinophils Absolute: 0.3 10*3/uL (ref 0.0–0.5)
Eosinophils Relative: 7 %
HCT: 35.7 % — ABNORMAL LOW (ref 36.0–46.0)
Hemoglobin: 11.9 g/dL — ABNORMAL LOW (ref 12.0–15.0)
Immature Granulocytes: 0 %
Lymphocytes Relative: 19 %
Lymphs Abs: 0.9 10*3/uL (ref 0.7–4.0)
MCH: 32.8 pg (ref 26.0–34.0)
MCHC: 33.3 g/dL (ref 30.0–36.0)
MCV: 98.3 fL (ref 80.0–100.0)
Monocytes Absolute: 0.6 10*3/uL (ref 0.1–1.0)
Monocytes Relative: 12 %
Neutro Abs: 2.9 10*3/uL (ref 1.7–7.7)
Neutrophils Relative %: 61 %
Platelet Count: 159 10*3/uL (ref 150–400)
RBC: 3.63 MIL/uL — ABNORMAL LOW (ref 3.87–5.11)
RDW: 13.3 % (ref 11.5–15.5)
WBC Count: 4.8 10*3/uL (ref 4.0–10.5)
nRBC: 0 % (ref 0.0–0.2)

## 2020-05-29 LAB — BASIC METABOLIC PANEL - CANCER CENTER ONLY
Anion gap: 10 (ref 5–15)
BUN: 8 mg/dL (ref 6–20)
CO2: 23 mmol/L (ref 22–32)
Calcium: 8.9 mg/dL (ref 8.9–10.3)
Chloride: 107 mmol/L (ref 98–111)
Creatinine: 0.67 mg/dL (ref 0.44–1.00)
GFR, Estimated: >60 mL/min (ref >60)
Glucose, Bld: 99 mg/dL (ref 70–99)
Potassium: 4.1 mmol/L (ref 3.5–5.1)
Sodium: 140 mmol/L (ref 135–145)

## 2020-05-29 MED ORDER — HEPARIN SOD (PORK) LOCK FLUSH 100 UNIT/ML IV SOLN
500.0000 [IU] | Freq: Once | INTRAVENOUS | Status: AC
  Administered 2020-05-29: 500 [IU]
  Filled 2020-05-29: qty 5

## 2020-05-29 MED ORDER — SODIUM CHLORIDE 0.9% FLUSH
10.0000 mL | Freq: Once | INTRAVENOUS | Status: AC
  Administered 2020-05-29: 10 mL
  Filled 2020-05-29: qty 10

## 2020-05-29 NOTE — Assessment & Plan Note (Signed)
We discussed the importance of patient's stop vaping

## 2020-05-29 NOTE — Assessment & Plan Note (Signed)
She denies recent bleeding Observe closely for now

## 2020-05-29 NOTE — Progress Notes (Signed)
[place] OFFICE PROGRESS NOTE  Patient Care Team: Patient, Madeline Howard (Inactive) as PCP - General (General Practice) Awanda Mink, Craige Cotta, RN as Oncology Nurse Navigator (Oncology)  ASSESSMENT & PLAN:  Malignant neoplasm of cervix [facility]) She is recovering well from side effects of chemotherapy Pancytopenia is slowly improving I we will arrange PET CT scan to be done in the first week of July for objective assessment of response to therapy She will return here middle of June for another port flush appointment for port maintains Once we confirmed complete response to treatment in the future, we can get her port removed  Anemia due to antineoplastic chemotherapy She denies recent bleeding Observe closely for now  Current every day vaping We discussed the importance of patient's stop vaping  Essential hypertension She will continue amlodipine We discussed importance of weight loss and lifestyle changes I am hopeful that we can start her blood pressure medication in the future   Orders Placed This Encounter  Procedures  . NM PET Image Restage (PS) Skull Base to Thigh    Standing Status:   Future    Standing Expiration Date:   05/29/2021    Order Specific Question:   If indicated for the ordered procedure, I authorize the administration of a radiopharmaceutical Howard Radiology protocol    Answer:   Yes    Order Specific Question:   Preferred imaging location?    Answer:   [facility]    Order Specific Question:   Radiology Contrast Protocol - do NOT remove file path    Answer:   [email or web address]    Order Specific Question:   Is the patient pregnant?    Answer:   Madeline    All questions were answered. The patient knows to call the clinic with any problems, questions or concerns. The total time spent in the appointment was 20 minutes encounter with patients including review of chart and various tests results, discussions about  plan of care and coordination of care plan   Madeline Lark, MD 05/29/2020 12:47 PM  INTERVAL HISTORY: Please see below for problem oriented charting. She returns for further follow-up after completion of treatment for cervical cancer She denies further vaginal bleeding She resumed vaping recently Madeline recent infection, fever or chills Denies pain Madeline recent changes in bowel habits  SUMMARY OF ONCOLOGIC HISTORY: Oncology History  Malignant neoplasm of cervix ([place])  12/11/2019 Initial Diagnosis   She presented with postmenopausal bleeding   01/12/2020 Initial Diagnosis   Malignant neoplasm of cervix ([place])   01/12/2020 Pathology Results   FINAL MICROSCOPIC DIAGNOSIS:   A. CERVIX, 3:00, 5:00, BIOPSY:  -  Invasive squamous cell carcinoma arising in a background of high-grade dysplasia  -  See comment   B.  ENDOCERVIX, CURETTAGE:  -  High grade squamous intraepithelial lesion (CIN 3; severe dysplasia/carcinoma in situ)    01/17/2020 Pathology Results   FINAL MICROSCOPIC DIAGNOSIS:   A. VAGINOCERVICAL JUNCTION, 6:00, BIOPSY:  - High grade squamous intraepithelial lesion, CIN-II/VAIN-II (moderate dysplasia).   B. VAGINOCERVICAL JUNCTION, 4:00, BIOPSY:  - High grade squamous intraepithelial lesion, CIN-III/VAIN-III (severe dysplasia/CIS).   C. VAGINOCERVICAL JUNCTION, 12:00, BIOPSY:  - Invasive squamous cell carcinoma, see comment.   D. ENDOMETRIUM, CURETTAGE:  - Predominately blood with fragments of high grade squamous intraepithelial lesion.   E. ENDOCERVIX, CURETTAGE:  - Invasive squamous cell carcinoma, see comment   01/17/2020 Surgery   Postoperative Diagnosis: Clinical stage IIB disease due to parametria thickening  Surgery: EUA, multiple cervical biopsies, ECC, EMB    Surgeons:  Jeral Pinch MD  Pathology: Biopsies at 4 and 6  o'c at the posterior cervicovaginal junction, biopsy from anterior cervix at 38 o'c, ECC, EMB   Operative findings: Cervix barrel shaped and  firm, visible lesion that is friable spanning most of the posterior cervix (approximately 2 x 3.5cm). Cervix is flush with the vagina posteriorly; lesion abuts the cervico-vaginal junction. Very gritty endocervical canal noted on ECC. EMB revealed somewhat purulent appearing blood. On rectovaginal exam, concerning for thickening posteriorly in the midline and to the left, concerning for parametria involvement.   01/26/2020 Cancer Staging   Staging form: Cervix Uteri, AJCC Version 9 - Clinical stage from 01/26/2020: FIGO Stage IIB (cT2b, cN0, cM0) - Signed by Madeline Lark, MD on 01/29/2020   01/26/2020 Imaging   1. Known cervical malignancy is not well appreciated. There is Madeline evidence of lymphadenopathy or metastatic disease in the abdomen or pelvis. 2. Probable fibroid of the anterior uterine body, not well delineated by CT.   Aortic Atherosclerosis (ICD10-I70.0).     02/13/2020 Procedure   Successful placement of a power injectable Port-A-Cath via the right internal jugular vein. The catheter is ready for immediate use.   02/21/2020 PET scan   1. Cervical and lower uterine segment mass with maximum SUV 12.2, compatible with malignancy. 2. Although the 7 mm right common iliac node questioned on recent pelvic MRI has only low-grade activity similar to blood pool on today's examination, there is a small focus of cutaneous nodularity along the lateral margin of the left labium majus which could conceivably represent a small cutaneous metastatic lymph node, although urinary contamination can sometimes cause a similar appearance. Physical exam inspection of this region along the skin fold between the left labium majus and left upper thigh region with attention to any underlying nodularity is suggested. 3. 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 If this is not a known condition, cardiology referral for further workup may be warranted. 4.  Aortic Atherosclerosis  (ICD10-I70.0).   02/23/2020 - 03/29/2020 Chemotherapy    Patient is on Treatment Plan: CERVICAL CISPLATIN Q7D         REVIEW OF SYSTEMS:   Constitutional: Denies fevers, chills or abnormal weight loss Eyes: Denies blurriness of vision Ears, nose, mouth, throat, and face: Denies mucositis or sore throat Respiratory: Denies cough, dyspnea or wheezes Cardiovascular: Denies palpitation, chest discomfort or lower extremity swelling Gastrointestinal:  Denies nausea, heartburn or change in bowel habits Skin: Denies abnormal skin rashes Lymphatics: Denies new lymphadenopathy or easy bruising Neurological:Denies numbness, tingling or new weaknesses Behavioral/Psych: Mood is stable, Madeline new changes  All other systems were reviewed with the patient and are negative.  I have reviewed the past medical history, past surgical history, social history and family history with the patient and they are unchanged from previous note.  ALLERGIES:  has Madeline Known Allergies.  MEDICATIONS:  Current Outpatient Medications  Medication Sig Dispense Refill  . amLODipine (NORVASC) 10 MG tablet Take 1 tablet (10 mg total) by mouth at bedtime. 30 tablet 3  . Chlorpheniramine Maleate (ALLERGY PO) Take 1 tablet by mouth daily.     . famotidine (PEPCID) 20 MG tablet Take 20 mg by mouth daily. OTC    . lidocaine-prilocaine (EMLA) cream Apply to affected area once 30 g 3  . Multiple Vitamin (MULTIVITAMIN) capsule Take 1 capsule by mouth daily.     Madeline current facility-administered  medications for this visit.    PHYSICAL EXAMINATION: ECOG PERFORMANCE STATUS: 1 - Symptomatic but completely ambulatory  Vitals:   05/29/20 1234  BP: (!) 141/66  Pulse: 72  Resp: 18  Temp: 98.8 F (37.1 C)  SpO2: 100%   Filed Weights   05/29/20 1234  Weight: 161 lb 9.6 oz (73.3 kg)    GENERAL:alert, Madeline distress and comfortable Musculoskeletal:Madeline cyanosis of digits and Madeline clubbing  NEURO: alert & oriented x 3 with fluent  speech, Madeline focal motor/sensory deficits  LABORATORY DATA:  I have reviewed the data as listed    Component Value Date/Time   NA 141 05/16/2020 0610   K 3.9 05/16/2020 0610   CL 104 05/16/2020 0610   CO2 23 04/18/2020 0837   GLUCOSE 95 05/16/2020 0610   BUN 6 05/16/2020 0610   CREATININE 0.50 05/16/2020 0610   CREATININE 0.62 04/03/2020 1456   CALCIUM 9.0 04/18/2020 0837   PROT 8.9 (H) 02/13/2020 1222   ALBUMIN 4.1 02/13/2020 1222   AST 41 02/13/2020 1222   AST 47 (H) 01/12/2020 1456   ALT 24 02/13/2020 1222   ALT 36 01/12/2020 1456   ALKPHOS 60 02/13/2020 1222   BILITOT 0.3 02/13/2020 1222   BILITOT 0.4 01/12/2020 1456   GFRNONAA >60 04/18/2020 0837   GFRNONAA >60 04/03/2020 1456    Madeline results found for: SPEP, UPEP  Lab Results  Component Value Date   WBC 4.8 05/29/2020   NEUTROABS 2.9 05/29/2020   HGB 11.9 (L) 05/29/2020   HCT 35.7 (L) 05/29/2020   MCV 98.3 05/29/2020   PLT 159 05/29/2020      Chemistry      Component Value Date/Time   NA 141 05/16/2020 0610   K 3.9 05/16/2020 0610   CL 104 05/16/2020 0610   CO2 23 04/18/2020 0837   BUN 6 05/16/2020 0610   CREATININE 0.50 05/16/2020 0610   CREATININE 0.62 04/03/2020 1456      Component Value Date/Time   CALCIUM 9.0 04/18/2020 0837   ALKPHOS 60 02/13/2020 1222   AST 41 02/13/2020 1222   AST 47 (H) 01/12/2020 1456   ALT 24 02/13/2020 1222   ALT 36 01/12/2020 1456   BILITOT 0.3 02/13/2020 1222   BILITOT 0.4 01/12/2020 1456

## 2020-05-29 NOTE — Assessment & Plan Note (Signed)
She is recovering well from side effects of chemotherapy Pancytopenia is slowly improving I we will arrange PET CT scan to be done in the first week of July for objective assessment of response to therapy She will return here middle of June for another port flush appointment for port maintains Once we confirmed complete response to treatment in the future, we can get her port removed

## 2020-05-29 NOTE — Assessment & Plan Note (Addendum)
She will continue amlodipine We discussed importance of weight loss and lifestyle changes I am hopeful that we can start her blood pressure medication in the future

## 2020-06-05 ENCOUNTER — Encounter: Payer: Self-pay | Admitting: Radiation Oncology

## 2020-06-11 ENCOUNTER — Encounter: Payer: Self-pay | Admitting: Radiology

## 2020-06-16 NOTE — Progress Notes (Incomplete)
  Radiation Oncology         [phone] ________________________________  Name: Madeline Howard MRN: [id number]  Date: 06/17/2020  DOB: [birth year]  Follow-Up Visit Note  CC: Patient, [name]  [name], MD  No diagnosis found.  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 She was last seen by Dr. Alvy Bimler on 05/29/2020, at which time she was recovering from the side effects of chemotherapy and her pancytopenia was slowly improving. PET-CT scan will be done during the first week of July for objective assessment of response to therapy.  On review of systems, she reports ***. She denies ***.                 ALLERGIES:  has No Known Allergies.  Meds: Current Outpatient Medications  Medication Sig Dispense Refill  . amLODipine (NORVASC) 10 MG tablet Take 1 tablet (10 mg total) by mouth at bedtime. 30 tablet 3  . Chlorpheniramine Maleate (ALLERGY PO) Take 1 tablet by mouth daily.     . famotidine (PEPCID) 20 MG tablet Take 20 mg by mouth daily. OTC    . lidocaine-prilocaine (EMLA) cream Apply to affected area once 30 g 3  . Multiple Vitamin (MULTIVITAMIN) capsule Take 1 capsule by mouth daily.     No current facility-administered medications for this encounter.    Physical Findings: The patient is in no acute distress. Patient is alert and oriented.  vitals were not taken for this visit.   No significant changes. Lungs are clear to auscultation bilaterally. Heart has regular rate and rhythm. No palpable cervical, supraclavicular, or axillary adenopathy. Abdomen soft, non-tender, normal bowel sounds. Pelvic exam deferred in light of recent radiation treatment. ***  Lab Findings: Lab Results  Component Value Date   WBC 4.8 05/29/2020   HGB 11.9 (L) 05/29/2020   HCT 35.7 (L) 05/29/2020   MCV 98.3 05/29/2020   PLT 159 05/29/2020    Radiographic Findings: No results found.  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  ***  Plan: The patient is scheduled to undergo PET-CT scan on 08/14/2020. She will then follow up with Dr. Alvy Bimler on 08/16/2020. She will follow up with Dr. Berline Lopes in two months and with radiation oncology in five months.  Total time spent in this encounter was *** minutes which included reviewing the patient's most recent follow-up with Dr. Alvy Bimler, physical examination, and documentation. ____________________________________   Blair Promise, PhD, MD  This document serves as a record of services personally performed by Gery Pray, MD. It was created on his behalf by Clerance Lav, a trained medical scribe. The creation of this record is based on the scribe's personal observations and the provider's statements to them. This document has been checked and approved by the attending provider.

## 2020-06-16 NOTE — Progress Notes (Incomplete)
  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 She did report bloody/brown vaginal discharge and vaginal bleeding that resolved as treatment continued. She also reported diarrhea, lower abdominal pain, and slight weight loss. Appetite and energy level remained stable. She denied constipation, urinary issues, skin changes, and fatigue.  Plan: The patient will follow-up with radiation oncology in one month.  ________________________________________________   Blair Promise, PhD, MD  This document serves as a record of services personally performed by Gery Pray, MD. It was created on his behalf by Clerance Lav, a trained medical scribe. The creation of this record is based on the scribe's personal observations and the provider's statements to them. This document has been checked and approved by the attending provider.

## 2020-06-17 ENCOUNTER — Ambulatory Visit
Admission: RE | Admit: 2020-06-17 | Discharge: 2020-06-17 | Disposition: A | Discharge status: Home / Self Care | Payer: Medicaid Other | Source: Ambulatory Visit | Attending: Radiation Oncology | Admitting: Radiation Oncology

## 2020-06-17 ENCOUNTER — Telehealth: Payer: Self-pay | Admitting: *Deleted

## 2020-06-17 HISTORY — DX: Personal history of irradiation: Z92.3

## 2020-06-17 NOTE — Telephone Encounter (Signed)
RETURNED PATIENT'S PHONE CALL TO ASK WHAT DAY THAT SHE WANTS TO RESCHEDULE TO, PATIENT AGREED TO COME ON 06-20-20 @ 10:45 AM

## 2020-06-19 NOTE — Progress Notes (Signed)
Radiation Oncology         [phone] ________________________________  Name: Madeline Howard MRN: [id number]  Date: 06/20/2020  DOB: Jun 23, 1965  Follow-Up Visit Note  CC: Patient, [name]  [name], MD    ICD-10-CM   1. 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 She was last seen by Dr. Alvy Bimler on 05/29/2020, at which time she was recovering from the side effects of chemotherapy and her pancytopenia was slowly improving. PET-CT scan will be done during the first week of July for objective assessment of response to therapy.  She will also see Dr. Berline Lopes at that time.  On review of systems, she reports ongoing fatigue but this is slowly improving.  She also notices some abdominal bloating but denies any diarrhea or rectal bleeding.  She denies any constipation.. She denies vaginal bleeding.  She has some irritation in the groin area from her radiation therapy and we have given her some radiaplex to place on this area.                 ALLERGIES:  has No Known Allergies.  Meds: Current Outpatient Medications  Medication Sig Dispense Refill  . amLODipine (NORVASC) 10 MG tablet Take 1 tablet (10 mg total) by mouth at bedtime. 30 tablet 3  . Chlorpheniramine Maleate (ALLERGY PO) Take 1 tablet by mouth daily.     . famotidine (PEPCID) 20 MG tablet Take  20 mg by mouth daily. OTC    . Multiple Vitamin (MULTIVITAMIN) capsule Take 1 capsule by mouth daily.    . RESTASIS 0.05 % ophthalmic emulsion 1 drop as needed.    . lidocaine-prilocaine (EMLA) cream Apply to affected area once (Patient not taking: Reported on 06/20/2020) 30 g 3   No current facility-administered medications for this encounter.    Physical Findings: The patient is in no acute distress. Patient is alert and oriented.  height is 5\' 6"  (1.676 m) and weight is 164 lb (74.4 kg). Her temperature is 97.7 F (36.5 C). Her blood pressure is 144/72 (abnormal) and her pulse is 65. Her respiration is 18 and oxygen saturation is 100%.   Lungs are clear to auscultation bilaterally. Heart has regular rate and rhythm. No palpable cervical, supraclavicular, or axillary adenopathy. Abdomen soft, non-tender, normal bowel sounds. Pelvic exam deferred in light of recent radiation treatment.   Lab Findings: Lab Results  Component Value Date   WBC 4.8 05/29/2020   HGB 11.9 (L) 05/29/2020   HCT 35.7 (L) 05/29/2020   MCV 98.3 05/29/2020   PLT 159 05/29/2020    Radiographic Findings: No results found.  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  She has persistent fatigue related to her chemotherapy and radiation treatments but this is slowly improving.  Plan: The patient is scheduled to undergo PET-CT scan  on 08/14/2020. She will then follow up with Dr. Alvy Bimler on 08/16/2020. She will follow up with Dr. Berline Lopes in two months and with radiation oncology in five months.  Today the patient was given a vaginal dilator and instructions on its use.   ____________________________________   Blair Promise, PhD, MD  This document serves as a record of services personally performed by Gery Pray, MD. It was created on his behalf by Clerance Lav, a trained medical scribe. The creation of this record is based on the  scribe's personal observations and the provider's statements to them. This document has been checked and approved by the attending provider.

## 2020-06-20 ENCOUNTER — Encounter: Payer: Self-pay | Admitting: Radiation Oncology

## 2020-06-20 ENCOUNTER — Other Ambulatory Visit: Payer: Self-pay

## 2020-06-20 ENCOUNTER — Ambulatory Visit
Admission: RE | Admit: 2020-06-20 | Discharge: 2020-06-20 | Disposition: A | Discharge status: Home / Self Care | Payer: Medicaid Other | Source: Ambulatory Visit | Attending: Radiation Oncology | Admitting: Radiation Oncology

## 2020-06-20 VITALS — BP 144/72 | HR 65 | Temp 97.7°F | Resp 18 | Ht 66.0 in | Wt 164.0 lb

## 2020-06-20 DIAGNOSIS — C530 Malignant neoplasm of endocervix: Secondary | ICD-10-CM | POA: Diagnosis present

## 2020-06-20 DIAGNOSIS — Y842 Radiological procedure and radiotherapy as the cause of abnormal reaction of the patient, or of later complication, without mention of misadventure at the time of the procedure: Secondary | ICD-10-CM | POA: Diagnosis not present

## 2020-06-20 DIAGNOSIS — Z923 Personal history of irradiation: Secondary | ICD-10-CM | POA: Diagnosis not present

## 2020-06-20 DIAGNOSIS — L989 Disorder of the skin and subcutaneous tissue, unspecified: Secondary | ICD-10-CM | POA: Insufficient documentation

## 2020-06-20 DIAGNOSIS — R5383 Other fatigue: Secondary | ICD-10-CM | POA: Insufficient documentation

## 2020-06-20 DIAGNOSIS — D61818 Other pancytopenia: Secondary | ICD-10-CM | POA: Insufficient documentation

## 2020-06-20 DIAGNOSIS — Z79899 Other long term (current) drug therapy: Secondary | ICD-10-CM | POA: Diagnosis not present

## 2020-06-20 MED ORDER — RADIAPLEXRX EX GEL
1.0000 "application " | Freq: Once | CUTANEOUS | Status: AC
  Administered 2020-06-20: 1 via TOPICAL

## 2020-06-20 NOTE — Addendum Note (Signed)
Encounter addended by: Ranell Patrick, RN on: 06/20/2020 11:39 AM  Actions taken: MAR administration accepted

## 2020-06-20 NOTE — Progress Notes (Signed)
Madeline Howard is here today for follow up post radiation to the pelvic.  They completed their radiation on: 05/16/2020   Does the patient complain of any of the following:  . Pain:denies . Abdominal bloating: reports bloating . Diarrhea/Constipation: denies . Nausea/Vomiting: denies . Vaginal Discharge: denies . Blood in Urine or Stool: denies . Urinary Issues: denies dysuria/incomplete emptying/ incontinence/ increased frequency/urgency. . Does patient report using vaginal dilator 2-3 times a week and/or sexually active 2-3 weeks: Dilators given at this visit and educated on use. Marland Kitchen Post radiation skin changes: Reports open areas to left vulva that are improving. Reports occasional discharge from these.   Additional comments if applicable: none   Vitals:   06/20/20 1055  BP: (!) 144/72  Pulse: 65  Resp: 18  Temp: 97.7 F (36.5 C)  SpO2: 100%  Weight: 164 lb (74.4 kg)  Height: 5\' 6"  (1.676 m)

## 2020-07-04 ENCOUNTER — Telehealth: Payer: Self-pay | Admitting: *Deleted

## 2020-07-04 NOTE — Telephone Encounter (Signed)
CALLED PATIENT TO INFORM OF FU APPT. WITH DR. Berline Lopes ON 08-16-20 @ 1:45 PM, SPOKE WITH PATIENT AND SHE IS AWARE OF THIS APPT.

## 2020-07-15 ENCOUNTER — Other Ambulatory Visit (HOSPITAL_COMMUNITY): Payer: Medicaid Other

## 2020-07-24 ENCOUNTER — Telehealth: Payer: Self-pay

## 2020-07-24 ENCOUNTER — Other Ambulatory Visit: Payer: Self-pay

## 2020-07-24 ENCOUNTER — Inpatient Hospital Stay: Payer: Medicaid Other

## 2020-07-24 MED ORDER — SODIUM CHLORIDE 0.9% FLUSH
10.0000 mL | Freq: Once | INTRAVENOUS | Status: DC
  Filled 2020-07-24: qty 10

## 2020-07-24 MED ORDER — HEPARIN SOD (PORK) LOCK FLUSH 100 UNIT/ML IV SOLN
500.0000 [IU] | Freq: Once | INTRAVENOUS | Status: DC
  Filled 2020-07-24: qty 5

## 2020-07-24 NOTE — Progress Notes (Unsigned)
This nurse was unable to find the pt and the pt did not answer the phone. I let the Dr and desk nurse know the situation.

## 2020-07-24 NOTE — Telephone Encounter (Signed)
This LPN attempted to call pt as she was checked in for port flush appt but could not be located in the building. Pt states she left because she had been waiting too long and had another appt to attend. Pt states she would like to r/s port flush for 07/31/20 at 1000. Message sent to scheduler to facilitate this. Pt is aware and verbalizes thanks.

## 2020-07-31 ENCOUNTER — Other Ambulatory Visit: Payer: Self-pay

## 2020-07-31 ENCOUNTER — Inpatient Hospital Stay: Payer: Medicaid Other | Attending: Gynecologic Oncology

## 2020-07-31 DIAGNOSIS — Z452 Encounter for adjustment and management of vascular access device: Secondary | ICD-10-CM | POA: Insufficient documentation

## 2020-07-31 DIAGNOSIS — C539 Malignant neoplasm of cervix uteri, unspecified: Secondary | ICD-10-CM | POA: Insufficient documentation

## 2020-07-31 MED ORDER — SODIUM CHLORIDE 0.9% FLUSH
10.0000 mL | Freq: Once | INTRAVENOUS | Status: AC
  Administered 2020-07-31: 10 mL
  Filled 2020-07-31: qty 10

## 2020-07-31 MED ORDER — HEPARIN SOD (PORK) LOCK FLUSH 100 UNIT/ML IV SOLN
500.0000 [IU] | Freq: Once | INTRAVENOUS | Status: AC
  Administered 2020-07-31: 500 [IU]
  Filled 2020-07-31: qty 5

## 2020-08-09 ENCOUNTER — Encounter: Payer: Self-pay | Admitting: Hematology and Oncology

## 2020-08-14 ENCOUNTER — Other Ambulatory Visit: Payer: Self-pay

## 2020-08-14 ENCOUNTER — Ambulatory Visit (HOSPITAL_COMMUNITY)
Admission: RE | Admit: 2020-08-14 | Discharge: 2020-08-14 | Disposition: A | Discharge status: Home / Self Care | Payer: Medicaid Other | Source: Ambulatory Visit | Attending: Hematology and Oncology | Admitting: Hematology and Oncology

## 2020-08-14 ENCOUNTER — Encounter: Payer: Self-pay | Admitting: Gynecologic Oncology

## 2020-08-14 DIAGNOSIS — C539 Malignant neoplasm of cervix uteri, unspecified: Secondary | ICD-10-CM | POA: Diagnosis not present

## 2020-08-14 LAB — GLUCOSE, CAPILLARY: Glucose-Capillary: 97 mg/dL (ref 70–99)

## 2020-08-14 MED ORDER — FLUDEOXYGLUCOSE F - 18 (FDG) INJECTION
8.5000 | Freq: Once | INTRAVENOUS | Status: AC
  Administered 2020-08-14: 8.02 via INTRAVENOUS

## 2020-08-16 ENCOUNTER — Inpatient Hospital Stay: Payer: Medicaid Other | Admitting: Gynecologic Oncology

## 2020-08-16 ENCOUNTER — Other Ambulatory Visit: Payer: Self-pay

## 2020-08-16 ENCOUNTER — Encounter: Payer: Self-pay | Admitting: Hematology and Oncology

## 2020-08-16 ENCOUNTER — Inpatient Hospital Stay: Payer: Medicaid Other | Attending: Gynecologic Oncology | Admitting: Hematology and Oncology

## 2020-08-16 DIAGNOSIS — Z8616 Personal history of COVID-19: Secondary | ICD-10-CM | POA: Diagnosis not present

## 2020-08-16 DIAGNOSIS — C539 Malignant neoplasm of cervix uteri, unspecified: Secondary | ICD-10-CM

## 2020-08-16 DIAGNOSIS — I10 Essential (primary) hypertension: Secondary | ICD-10-CM | POA: Diagnosis not present

## 2020-08-16 DIAGNOSIS — Z9221 Personal history of antineoplastic chemotherapy: Secondary | ICD-10-CM | POA: Diagnosis not present

## 2020-08-16 DIAGNOSIS — Z08 Encounter for follow-up examination after completed treatment for malignant neoplasm: Secondary | ICD-10-CM | POA: Insufficient documentation

## 2020-08-16 DIAGNOSIS — Z923 Personal history of irradiation: Secondary | ICD-10-CM | POA: Insufficient documentation

## 2020-08-16 DIAGNOSIS — K219 Gastro-esophageal reflux disease without esophagitis: Secondary | ICD-10-CM | POA: Insufficient documentation

## 2020-08-16 DIAGNOSIS — R911 Solitary pulmonary nodule: Secondary | ICD-10-CM | POA: Insufficient documentation

## 2020-08-16 DIAGNOSIS — Z79899 Other long term (current) drug therapy: Secondary | ICD-10-CM | POA: Insufficient documentation

## 2020-08-16 DIAGNOSIS — Z8541 Personal history of malignant neoplasm of cervix uteri: Secondary | ICD-10-CM | POA: Insufficient documentation

## 2020-08-16 DIAGNOSIS — Z780 Asymptomatic menopausal state: Secondary | ICD-10-CM | POA: Diagnosis not present

## 2020-08-16 DIAGNOSIS — U070 Vaping-related disorder: Secondary | ICD-10-CM | POA: Diagnosis not present

## 2020-08-16 MED ORDER — AMLODIPINE BESYLATE 10 MG PO TABS
10.0000 mg | ORAL_TABLET | Freq: Every day | ORAL | 3 refills | Status: DC
Start: 2020-08-16 — End: 2020-12-16

## 2020-08-16 NOTE — Assessment & Plan Note (Signed)
Likely due to vaping Will repeat imaging study in 3 months

## 2020-08-16 NOTE — Assessment & Plan Note (Signed)
Her blood pressure is grossly elevated She has not been checking her blood pressure regularly She did not refill amlodipine when it ran out I recommend the patient to resume amlodipine Refill her prescription She needs follow-up either with myself or primary care doctor for medication

## 2020-08-16 NOTE — Progress Notes (Signed)
[place] OFFICE PROGRESS NOTE  Patient Care Team: Patient, [name] as PCP - General (General Practice) Awanda Mink, Craige Cotta, RN as Oncology Nurse Navigator (Oncology)  ASSESSMENT & PLAN:  Malignant neoplasm of cervix [facility]) I have reviewed PET CT scan with the patient She has complete response to treatment She will follow with Dr. Berline Lopes next week for pelvic exam If confirmed she has complete response, we can get her port removed  Essential hypertension Her blood pressure is grossly elevated She has not been checking her blood pressure regularly She did not refill amlodipine when it ran out I recommend the patient to resume amlodipine Refill her prescription She needs follow-up either with myself or primary care doctor for medication  Lesion of lung Likely due to vaping Will repeat imaging study in 3 months  No orders of the defined types were placed in this encounter.   All questions were answered. The patient knows to call the clinic with any problems, questions or concerns. The total time spent in the appointment was 20 minutes encounter with patients including review of chart and various tests results, discussions about plan of care and coordination of care plan   Heath Lark, MD 08/16/2020 2:26 PM  INTERVAL HISTORY: Please see below for problem oriented charting. She returns for further follow-up Denies recent vaginal bleeding No residual side effects from treatment She has not been taking her blood pressure medication lately  SUMMARY OF ONCOLOGIC HISTORY: Oncology History  Malignant neoplasm of cervix ([place])  12/11/2019 Initial Diagnosis   She presented with postmenopausal bleeding   01/12/2020 Initial Diagnosis   Malignant neoplasm of cervix ([place])   01/12/2020 Pathology Results   FINAL MICROSCOPIC DIAGNOSIS:   A. 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  ENDOCERVIX, CURETTAGE:  -  High grade squamous intraepithelial lesion (CIN 3; severe dysplasia/carcinoma in situ)    01/17/2020 Pathology Results   FINAL MICROSCOPIC DIAGNOSIS:   A. VAGINOCERVICAL JUNCTION, 6:00, BIOPSY:  - High grade squamous intraepithelial lesion, CIN-II/VAIN-II (moderate dysplasia).   B. VAGINOCERVICAL JUNCTION, 4:00, BIOPSY:  - High grade squamous intraepithelial lesion, CIN-III/VAIN-III (severe dysplasia/CIS).   C. VAGINOCERVICAL JUNCTION, 12:00, BIOPSY:  - Invasive squamous cell carcinoma, see comment.   D. ENDOMETRIUM, CURETTAGE:  - Predominately blood with fragments of high grade squamous intraepithelial lesion.   E. ENDOCERVIX, CURETTAGE:  - Invasive squamous cell carcinoma, see comment   01/17/2020 Surgery   Postoperative Diagnosis: Clinical stage IIB disease due to parametria thickening   Surgery: EUA, multiple cervical biopsies, ECC, EMB    Surgeons:  Jeral Pinch MD  Pathology: Biopsies at 4 and 6  o'c at the posterior cervicovaginal junction, biopsy from anterior cervix at 12 o'c, ECC, EMB   Operative findings: Cervix barrel shaped and firm, visible lesion that is friable spanning most of the posterior cervix (approximately 2 x 3.5cm). Cervix is flush with the vagina posteriorly; lesion abuts the cervico-vaginal junction. Very gritty endocervical canal noted on ECC. EMB revealed somewhat purulent appearing blood. On rectovaginal exam, concerning for thickening posteriorly in the midline and to the left, concerning for parametria involvement.   01/26/2020 Cancer Staging   Staging form: Cervix Uteri, AJCC Version 9 - Clinical stage from 01/26/2020: FIGO Stage IIB (cT2b, cN0, cM0) - Signed by Heath Lark, MD on 01/29/2020    01/26/2020 Imaging   1. Known cervical malignancy is not  well appreciated. There is no evidence of lymphadenopathy or metastatic disease in the abdomen or pelvis. 2. Probable fibroid of the anterior uterine  body, not well delineated by CT.   Aortic Atherosclerosis (ICD10-I70.0).     02/13/2020 Procedure   Successful placement of a power injectable Port-A-Cath via the right internal jugular vein. The catheter is ready for immediate use.   02/21/2020 PET scan   1. Cervical and lower uterine segment mass with maximum SUV 12.2, compatible with malignancy. 2. Although the 7 mm right common iliac node questioned on recent pelvic MRI has only low-grade activity similar to blood pool on today's examination, there is a small focus of cutaneous nodularity along the lateral margin of the left labium majus which could conceivably represent a small cutaneous metastatic lymph node, although urinary contamination can sometimes cause a similar appearance. Physical exam inspection of this region along the skin fold between the left labium majus and left upper thigh region with attention to any underlying nodularity is suggested. 3. 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 If this is not a known condition, cardiology referral for further workup may be warranted. 4.  Aortic Atherosclerosis (ICD10-I70.0).   02/23/2020 - 03/29/2020 Chemotherapy    Patient is on Treatment Plan: CERVICAL CISPLATIN Q7D       02/28/2020 - 05/16/2020 Radiation Therapy   Radiation Treatment Dates: 02/28/2020 through 05/16/2020   Site: Pelvis Technique: 3D Total Dose (Gy): 45/45 Dose per Fx (Gy): 1.8 Completed Fx: 25/25 Beam Energies: 15X   Site: Right pelvis boost Technique: 3D Total Dose (Gy): 5.4/5.4 Dose per Fx (Gy): 1.8 Completed Fx: 3/3 Beam Energies: 15X   Site: Cervix; boost Technique: HDR-brachytherapy Total Dose (Gy): 27.5/27.5 Dose per Fx (Gy): 5.5 Completed Fx: 5/5 Beam Energies: Ir-192   08/15/2020 PET scan   Marked interval response to therapy is with no definable mass in the area of cervix and vagina on CT images, decreased bulk of the uterus and cervix and FDG uptake only slightly  greater than blood pool activity throughout the area with the exception of the upper vagina which shows mildly increased FDG uptake which is nonspecific in the setting of radiotherapy and greatly improved. Attention on follow-up.   No signs of nodal or visceral metastasis.   New tiny LEFT upper lobe pulmonary nodule is nonspecific, atypical location for metastatic focus and without FDG uptake though with limited assessment based on small size. Consider short interval follow-up given patient history.   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     REVIEW OF SYSTEMS:   Constitutional: Denies fevers, chills or abnormal weight loss Eyes: Denies blurriness of vision Ears, nose, mouth, throat, and face: Denies mucositis or sore throat Respiratory: Denies cough, dyspnea or wheezes Cardiovascular: Denies palpitation, chest discomfort or lower extremity swelling Gastrointestinal:  Denies nausea, heartburn or change in bowel habits Skin: Denies abnormal skin rashes Lymphatics: Denies new lymphadenopathy or easy bruising Neurological:Denies numbness, tingling or new weaknesses Behavioral/Psych: Mood is stable, no new changes  All other systems were reviewed with the patient and are negative.  I have reviewed the past medical history, past surgical history, social history and family history with the patient and they are unchanged from previous note.  ALLERGIES:  has No Known Allergies.  MEDICATIONS:  Current Outpatient Medications  Medication Sig Dispense Refill   amLODipine (NORVASC) 10 MG tablet Take 1 tablet (10 mg total) by mouth daily. 30 tablet 3  Chlorpheniramine Maleate (ALLERGY PO) Take 1 tablet by mouth daily.      famotidine (PEPCID) 20 MG tablet Take 20 mg by mouth daily. OTC     Multiple Vitamin (MULTIVITAMIN) capsule Take 1 capsule by mouth daily.     RESTASIS 0.05 % ophthalmic emulsion 1 drop as needed.     No current  facility-administered medications for this visit.   Facility-Administered Medications Ordered in Other Visits  Medication Dose Route Frequency Provider Last Rate Last Admin   heparin lock flush 100 unit/mL  500 Units Intracatheter Once Alvy Bimler, Breven Guidroz, MD       sodium chloride flush (NS) 0.9 % injection 10 mL  10 mL Intracatheter Once Alvy Bimler, Daniesha Driver, MD        PHYSICAL EXAMINATION: ECOG PERFORMANCE STATUS: 0 - Asymptomatic  Vitals:   08/16/20 1247  BP: (!) 165/68  Pulse: 78  Resp: 16  Temp: 98.6 F (37 C)  SpO2: 99%   Filed Weights   08/16/20 1247  Weight: 163 lb (73.9 kg)    GENERAL:alert, no distress and comfortable SKIN: skin color, texture, turgor are normal, no rashes or significant lesions EYES: normal, Conjunctiva are pink and non-injected, sclera clear OROPHARYNX:no exudate, no erythema and lips, buccal mucosa, and tongue normal  NECK: supple, thyroid normal size, non-tender, without nodularity LYMPH:  no palpable lymphadenopathy in the cervical, axillary or inguinal LUNGS: clear to auscultation and percussion with normal breathing effort HEART: regular rate & rhythm and no murmurs and no lower extremity edema ABDOMEN:abdomen soft, non-tender and normal bowel sounds Musculoskeletal:no cyanosis of digits and no clubbing  NEURO: alert & oriented x 3 with fluent speech, no focal motor/sensory deficits  LABORATORY DATA:  I have reviewed the data as listed    Component Value Date/Time   NA 140 05/29/2020 1218   K 4.1 05/29/2020 1218   CL 107 05/29/2020 1218   CO2 23 05/29/2020 1218   GLUCOSE 99 05/29/2020 1218   BUN 8 05/29/2020 1218   CREATININE 0.67 05/29/2020 1218   CALCIUM 8.9 05/29/2020 1218   PROT 8.9 (H) 02/13/2020 1222   ALBUMIN 4.1 02/13/2020 1222   AST 41 02/13/2020 1222   AST 47 (H) 01/12/2020 1456   ALT 24 02/13/2020 1222   ALT 36 01/12/2020 1456   ALKPHOS 60 02/13/2020 1222   BILITOT 0.3 02/13/2020 1222   BILITOT 0.4 01/12/2020 1456   GFRNONAA >60  05/29/2020 1218    No results found for: SPEP, UPEP  Lab Results  Component Value Date   WBC 4.8 05/29/2020   NEUTROABS 2.9 05/29/2020   HGB 11.9 (L) 05/29/2020   HCT 35.7 (L) 05/29/2020   MCV 98.3 05/29/2020   PLT 159 05/29/2020      Chemistry      Component Value Date/Time   NA 140 05/29/2020 1218   K 4.1 05/29/2020 1218   CL 107 05/29/2020 1218   CO2 23 05/29/2020 1218   BUN 8 05/29/2020 1218   CREATININE 0.67 05/29/2020 1218      Component Value Date/Time   CALCIUM 8.9 05/29/2020 1218   ALKPHOS 60 02/13/2020 1222   AST 41 02/13/2020 1222   AST 47 (H) 01/12/2020 1456   ALT 24 02/13/2020 1222   ALT 36 01/12/2020 1456   BILITOT 0.3 02/13/2020 1222   BILITOT 0.4 01/12/2020 1456       RADIOGRAPHIC STUDIES: I have personally reviewed the radiological images as listed and agreed with the findings in the report. NM PET Image Restage (PS)  Skull Base to Thigh  Result Date: 08/15/2020 CLINICAL DATA:  Subsequent treatment strategy for uterine/cervical cancer assess treatment response. 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 EXAM: NUCLEAR MEDICINE PET SKULL BASE TO THIGH TECHNIQUE: 8.03 mCi F-18 FDG was injected intravenously. Full-ring PET imaging was performed from the skull base to thigh after the radiotracer. CT data was obtained and used for attenuation correction and anatomic localization. Fasting blood glucose: 97 mg/dl COMPARISON:  02/21/2020. FINDINGS: Mediastinal blood pool activity: SUV max 2.11 Liver activity: SUV max NA NECK: No hypermetabolic lymph nodes in the neck. Incidental CT findings: none CHEST: No hypermetabolic mediastinal or hilar nodes. Tiny pulmonary nodule 3-4 mm at the LEFT lung apex not seen on the prior study (image 17/8) not associated with increased FDG uptake. No additional new pulmonary nodules. Incidental CT findings: Marked dilation of main pulmonary artery as on the previous exam. Airways are patent. No  consolidation. No pleural effusion. Mildly patulous esophagus. Scattered aortic atherosclerosis. ABDOMEN/PELVIS: No sign of solid organ metastasis. No signs of FDG avid disease involving lymph nodes in the abdomen or pelvis. Area of concern in the cervix and uterus and upper vagina with resolution of much of the FDG uptake that was present previously. The area appears less bulky. Maximum SUV in this location at 2.6 as compared to 12.2 on the previous study. In area along the upper vagina towards the vaginal apex with a maximum SUV of 4.1 again previously greater than 20 in this location. No definable mass with mild fullness. No increased metabolic activity along the RIGHT pelvic sidewall. Mildly enlarged lymph node along the RIGHT common iliac chain on the prior study at 7 mm previously now at 5 mm. (Image 149/4) uptake similar to adjacent vasculature, less than cardiac blood Incidental CT findings: Liver with smooth contours. No acute abnormality related to gallbladder, pancreas, spleen, adrenal glands, kidneys, urinary bladder, stomach, small and large bowel. Aortic atherosclerosis without aneurysmal dilation. Smooth contour of the IVC. Decreased bulk of the uterus and cervix compared to the previous study. No ascites. No free air. SKELETON: No focal hypermetabolic activity to suggest skeletal metastasis. Incidental CT findings: Spinal degenerative changes. IMPRESSION: Marked interval response to therapy is with no definable mass in the area of cervix and vagina on CT images, decreased bulk of the uterus and cervix and FDG uptake only slightly greater than blood pool activity throughout the area with the exception of the upper vagina which shows mildly increased FDG uptake which is nonspecific in the setting of radiotherapy and greatly improved. Attention on follow-up. No signs of nodal or visceral metastasis. New tiny LEFT upper lobe pulmonary nodule is nonspecific, atypical location for metastatic focus and  without FDG uptake though with limited assessment based on small size. Consider short interval follow-up given patient history. 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 Electronically Signed   By: Zetta Bills M.D.   On: 08/15/2020 08:56

## 2020-08-16 NOTE — Assessment & Plan Note (Signed)
I have reviewed PET CT scan with the patient She has complete response to treatment She will follow with Dr. Berline Lopes next week for pelvic exam If confirmed she has complete response, we can get her port removed

## 2020-08-19 ENCOUNTER — Encounter: Payer: Self-pay | Admitting: Gynecologic Oncology

## 2020-08-20 ENCOUNTER — Inpatient Hospital Stay (HOSPITAL_BASED_OUTPATIENT_CLINIC_OR_DEPARTMENT_OTHER): Payer: Medicaid Other | Admitting: Gynecologic Oncology

## 2020-08-20 ENCOUNTER — Other Ambulatory Visit: Payer: Self-pay

## 2020-08-20 ENCOUNTER — Encounter: Payer: Self-pay | Admitting: Gynecologic Oncology

## 2020-08-20 ENCOUNTER — Other Ambulatory Visit: Payer: Self-pay | Admitting: Hematology and Oncology

## 2020-08-20 VITALS — BP 167/87 | HR 83 | Temp 97.7°F | Resp 18 | Ht 66.0 in | Wt 162.2 lb

## 2020-08-20 DIAGNOSIS — Z8541 Personal history of malignant neoplasm of cervix uteri: Secondary | ICD-10-CM

## 2020-08-20 DIAGNOSIS — Z08 Encounter for follow-up examination after completed treatment for malignant neoplasm: Secondary | ICD-10-CM | POA: Diagnosis not present

## 2020-08-20 DIAGNOSIS — C539 Malignant neoplasm of cervix uteri, unspecified: Secondary | ICD-10-CM

## 2020-08-20 NOTE — Progress Notes (Signed)
Gynecologic Oncology Return Clinic Visit  08/20/20  Reason for Visit: Visit after completion of primary chemoradiation in the setting of locally advanced cervix cancer.  Treatment History: Oncology History  Malignant neoplasm of cervix ([place])  12/11/2019 Initial Diagnosis   She presented with postmenopausal bleeding   01/12/2020 Initial Diagnosis   Malignant neoplasm of cervix ([place])   01/12/2020 Pathology Results   FINAL MICROSCOPIC DIAGNOSIS:   A. CERVIX, 3:00, 5:00, BIOPSY:  -  Invasive squamous cell carcinoma arising in a background of high-grade dysplasia  -  See comment   B.  ENDOCERVIX, CURETTAGE:  -  High grade squamous intraepithelial lesion (CIN 3; severe dysplasia/carcinoma in situ)    01/17/2020 Pathology Results   FINAL MICROSCOPIC DIAGNOSIS:   A. VAGINOCERVICAL JUNCTION, 6:00, BIOPSY:  - High grade squamous intraepithelial lesion, CIN-II/VAIN-II (moderate dysplasia).   B. VAGINOCERVICAL JUNCTION, 4:00, BIOPSY:  - High grade squamous intraepithelial lesion, CIN-III/VAIN-III (severe dysplasia/CIS).   C. VAGINOCERVICAL JUNCTION, 12:00, BIOPSY:  - Invasive squamous cell carcinoma, see comment.   D. ENDOMETRIUM, CURETTAGE:  - Predominately blood with fragments of high grade squamous intraepithelial lesion.   E. ENDOCERVIX, CURETTAGE:  - Invasive squamous cell carcinoma, see comment   01/17/2020 Surgery   Postoperative Diagnosis: Clinical stage IIB disease due to parametria thickening   Surgery: EUA, multiple cervical biopsies, ECC, EMB    Surgeons:  Jeral Pinch MD  Pathology: Biopsies at 4 and 6  o'c at the posterior cervicovaginal junction, biopsy from anterior cervix at 56 o'c, ECC, EMB   Operative findings: Cervix barrel shaped and firm, visible lesion that is friable spanning most of the posterior cervix (approximately 2 x 3.5cm). Cervix is flush with the vagina posteriorly; lesion abuts the cervico-vaginal junction. Very gritty endocervical canal noted  on ECC. EMB revealed somewhat purulent appearing blood. On rectovaginal exam, concerning for thickening posteriorly in the midline and to the left, concerning for parametria involvement.   01/26/2020 Cancer Staging   Staging form: Cervix Uteri, AJCC Version 9 - Clinical stage from 01/26/2020: FIGO Stage IIB (cT2b, cN0, cM0) - Signed by Heath Lark, MD on 01/29/2020    01/26/2020 Imaging   1. Known cervical malignancy is not well appreciated. There is no evidence of lymphadenopathy or metastatic disease in the abdomen or pelvis. 2. Probable fibroid of the anterior uterine body, not well delineated by CT.   Aortic Atherosclerosis (ICD10-I70.0).     02/13/2020 Procedure   Successful placement of a power injectable Port-A-Cath via the right internal jugular vein. The catheter is ready for immediate use.   02/21/2020 PET scan   1. Cervical and lower uterine segment mass with maximum SUV 12.2, compatible with malignancy. 2. Although the 7 mm right common iliac node questioned on recent pelvic MRI has only low-grade activity similar to blood pool on today's examination, there is a small focus of cutaneous nodularity along the lateral margin of the left labium majus which could conceivably represent a small cutaneous metastatic lymph node, although urinary contamination can sometimes cause a similar appearance. Physical exam inspection of this region along the skin fold between the left labium majus and left upper thigh region with attention to any underlying nodularity is suggested. 3. 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 If this is not a known condition, cardiology referral for further workup may be warranted. 4.  Aortic Atherosclerosis (ICD10-I70.0).   02/23/2020 - 03/29/2020 Chemotherapy    Patient is on Treatment Plan: CERVICAL CISPLATIN Q7D  02/28/2020 - 05/16/2020 Radiation Therapy   Radiation Treatment Dates: 02/28/2020 through 05/16/2020   Site:  Pelvis Technique: 3D Total Dose (Gy): 45/45 Dose per Fx (Gy): 1.8 Completed Fx: 25/25 Beam Energies: 15X   Site: Right pelvis boost Technique: 3D Total Dose (Gy): 5.4/5.4 Dose per Fx (Gy): 1.8 Completed Fx: 3/3 Beam Energies: 15X   Site: Cervix; boost Technique: HDR-brachytherapy Total Dose (Gy): 27.5/27.5 Dose per Fx (Gy): 5.5 Completed Fx: 5/5 Beam Energies: Ir-192   08/15/2020 PET scan   Marked interval response to therapy is with no definable mass in the area of cervix and vagina on CT images, decreased bulk of the uterus and cervix and FDG uptake only slightly greater than blood pool activity throughout the area with the exception of the upper vagina which shows mildly increased FDG uptake which is nonspecific in the setting of radiotherapy and greatly improved. Attention on follow-up.   No signs of nodal or visceral metastasis.   New tiny LEFT upper lobe pulmonary nodule is nonspecific, atypical location for metastatic focus and without FDG uptake though with limited assessment based on small size. Consider short interval follow-up given patient history.   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     Interval History: The patient presents today for her first follow-up visit with me after completion of definitive chemoradiation in the setting of locally advanced cervical cancer.  She is doing very well.  She reports minimal side effects related to radiation, mostly some skin irritation in her left groin.  She denies any vaginal bleeding or discharge.  She reports normal bowel and bladder function.  She endorses a good appetite without any nausea or emesis.  She had some fatigue during treatment but this has resolved.  She is using her vaginal dilator regularly.  Past Medical/Surgical History: Past Medical History:  Diagnosis Date   Allergy    Cervical cancer ([place])    COVID 02/2019   loss of taste and smell still present   GERD  (gastroesophageal reflux disease)    History of radiation therapy 02/28/20-04/08/20   IMRT to endocervix-  Dr. Gery Pray    History of radiation therapy 04/18/2020-05/16/2020   vaginal brachytherapy (HDR) to endocervix     Dr Gery Pray   Hypertension    Wears glasses     Past Surgical History:  Procedure Laterality Date   IR IMAGING GUIDED PORT INSERTION  02/13/2020   OPERATIVE ULTRASOUND N/A 04/18/2020   Procedure: OPERATIVE ULTRASOUND;  Surgeon: Gery Pray, MD;  Location: [facility];  Service: Urology;  Laterality: N/A;   OPERATIVE ULTRASOUND N/A 04/22/2020   Procedure: OPERATIVE ULTRASOUND;  Surgeon: Gery Pray, MD;  Location: [facility];  Service: Urology;  Laterality: N/A;   OPERATIVE ULTRASOUND N/A 05/02/2020   Procedure: OPERATIVE ULTRASOUND;  Surgeon: Gery Pray, MD;  Location: [facility];  Service: Urology;  Laterality: N/A;   OPERATIVE ULTRASOUND N/A 05/09/2020   Procedure: OPERATIVE ULTRASOUND;  Surgeon: Gery Pray, MD;  Location: [facility];  Service: Urology;  Laterality: N/A;   OPERATIVE ULTRASOUND N/A 05/16/2020   Procedure: OPERATIVE ULTRASOUND;  Surgeon: Gery Pray, MD;  Location: [facility];  Service: Urology;  Laterality: N/A;   TANDEM RING INSERTION N/A 04/18/2020   Procedure: TANDEM RING INSERTION;  Surgeon: Gery Pray, MD;  Location: [facility];  Service: Urology;  Laterality: N/A;   TANDEM RING INSERTION N/A 04/22/2020   Procedure: TANDEM  RING INSERTION;  Surgeon: Gery Pray, MD;  Location: [facility];  Service: Urology;  Laterality: N/A;   TANDEM RING INSERTION N/A 05/02/2020   Procedure: TANDEM RING INSERTION;  Surgeon: Gery Pray, MD;  Location: [facility];  Service: Urology;  Laterality: N/A;   TANDEM RING INSERTION N/A 05/09/2020   Procedure: TANDEM RING INSERTION;  Surgeon: Gery Pray, MD;  Location: [facility];  Service: Urology;  Laterality: N/A;   TANDEM RING INSERTION N/A 05/16/2020   Procedure: TANDEM RING INSERTION;  Surgeon: Gery Pray, MD;  Location: [facility];  Service: Urology;  Laterality: N/A;    Family History  Problem Relation Age of Onset   Cancer Mother        colon   Diabetes Father    Cancer Brother        melanoma   Breast cancer Neg Hx    Uterine cancer Neg Hx    Ovarian cancer Neg Hx     Social History   Socioeconomic History   Marital status: Significant Other    Spouse name: Toy Baker   Number of children: 2   Years of education: Not on file   Highest education level: Not on file  Occupational History   Occupation: unemployed    Comment: Secondary school teacher and [employer]  Tobacco Use   Smoking status: Former    Packs/day: 1.50    Years: 15.00    Pack years: 22.50    Types: Cigarettes, E-cigarettes    Quit date: 07/15/2016    Years since quitting: 4.1   Smokeless tobacco: Never  Vaping Use   Vaping Use: Every day   Start date: 07/15/2016   Substances: Nicotine, Flavoring  Substance and Sexual Activity   Alcohol use: Never   Drug use: Never   Sexual activity: Yes    Birth control/protection: None  Other Topics Concern   Not on file  Social History Narrative   Not on file   Social Determinants of Health   Financial Resource Strain: Not on file  Food Insecurity: Not on file  Transportation Needs: Not on file  Physical Activity: Not on file  Stress: Not on file  Social Connections: Not on file    Current Medications:  Current Outpatient Medications:    amLODipine (NORVASC) 10 MG tablet, Take 1 tablet (10 mg total) by mouth daily., Disp: 30 tablet, Rfl: 3   Chlorpheniramine Maleate (ALLERGY PO), Take 1 tablet by mouth daily.  (Patient not taking: Reported on 08/19/2020), Disp: , Rfl:    famotidine (PEPCID) 20 MG tablet, Take 20 mg by mouth daily. OTC (Patient not taking: Reported on 08/19/2020), Disp: , Rfl:    Multiple  Vitamin (MULTIVITAMIN) capsule, Take 1 capsule by mouth daily. (Patient not taking: Reported on 08/19/2020), Disp: , Rfl:    RESTASIS 0.05 % ophthalmic emulsion, 1 drop as needed. (Patient not taking: Reported on 08/19/2020), Disp: , Rfl:   Review of Systems: Denies appetite changes, fevers, chills, fatigue, unexplained weight changes. Denies hearing loss, neck lumps or masses, mouth sores, ringing in ears or voice changes. Denies cough or wheezing.  Denies shortness of breath. Denies chest pain or palpitations. Denies leg swelling. Denies abdominal distention, pain, blood in stools, constipation, diarrhea, nausea, vomiting, or early satiety. Denies pain with intercourse, dysuria, frequency, hematuria or incontinence. Denies hot flashes, pelvic pain, vaginal bleeding or vaginal discharge.   Denies joint pain, back pain or muscle pain/cramps. Denies itching, rash, or wounds. Denies  dizziness, headaches, numbness or seizures. Denies swollen lymph nodes or glands, denies easy bruising or bleeding. Denies anxiety, depression, confusion, or decreased concentration.  Physical Exam: BP (!) 167/87 (BP Location: Left Arm, Patient Position: Sitting)   Pulse 83   Temp 97.7 F (36.5 C) (Tympanic)   Resp 18   Ht 5\' 6"  (1.676 m)   Wt 162 lb 3.2 oz (73.6 kg)   SpO2 100%   BMI 26.18 kg/m  General: Alert, oriented, no acute distress. HEENT: Posterior oropharynx clear, sclera anicteric. Chest: Unlabored breathing on room air. Cardiovascular: Regular rate and rhythm, no murmurs. Abdomen: soft, nontender.  Normoactive bowel sounds.  No masses or hepatosplenomegaly appreciated.   Extremities: Grossly normal range of motion.  Warm, well perfused.  No edema bilaterally. Skin: No rashes or lesions noted. Lymphatics: No cervical, supraclavicular, or inguinal adenopathy. GU: Normal appearing external genitalia without erythema.  There are some small areas of skin disruption in the left groin and over the  left side of the mons that appear to be healing.  Speculum exam reveals mildly atrophic vaginal mucosa with radiation changes evident.  Cervix is normal in appearance, posterior aspect of the cervix is flush with the vaginal apex.  No bleeding or discharge.  Bimanual exam reveals cervix smooth, no masses or nodularity.  Rectovaginal exam  confirms nodularity, no parametrial involvement.  Laboratory & Radiologic Studies: None new new  Assessment & Plan: KINNLEY PAULSON is a 55 y.o. woman with Stage IIB SCC of the cervix who presents for her first surveillance visit with me.  The patient did quite well with treatment with minimal side effects.  She is NED on exam today.  She was given a survivorship care plan which we reviewed together.  I reinforced the importance of regular vaginal dilator use to help prevent vaginal agglutination.  Per NCCN surveillance recommendations, we will continue with visits every 3 months, alternating between my office and radiation oncology.  She will have a Pap test yearly with her next due in April 2023.  She is scheduled to see Dr. Sondra Come in October and will return to see me in January.  She knows to call towards the end of the year once my schedule is out to get that visit scheduled.  We discussed signs and symptoms that would be concerning for disease recurrence and she knows to call if she develops any of these prior to her neck scheduled visit.  32 minutes of total time was spent for this patient encounter, including preparation, face-to-face counseling with the patient and coordination of care, and documentation of the encounter.  Jeral Pinch, MD  Division of Gynecologic Oncology  Department of Obstetrics and Gynecology  [facility]

## 2020-08-20 NOTE — Patient Instructions (Signed)
It was great to see you today!  Congratulations on finishing treatment.  I do not see or feel anything concerning for cancer on your exam today.  I will let Dr. Alvy Bimler know that I think her port can come out.  You will see Dr. Sondra Come in October and then seeing me in January.  My schedule is not out past December.  If you have not heard from [place] by the end of November or early December, please call to get a follow-up scheduled in January.  If you develop any new symptoms, such as vaginal bleeding, discharge, pelvic pain, weight loss, change to your bowel function between visits, please call to come in sooner.

## 2020-08-21 ENCOUNTER — Telehealth: Payer: Self-pay | Admitting: Oncology

## 2020-08-21 ENCOUNTER — Other Ambulatory Visit: Payer: Self-pay | Admitting: Gynecologic Oncology

## 2020-08-21 DIAGNOSIS — R911 Solitary pulmonary nodule: Secondary | ICD-10-CM

## 2020-08-21 DIAGNOSIS — C539 Malignant neoplasm of cervix uteri, unspecified: Secondary | ICD-10-CM

## 2020-08-21 NOTE — Telephone Encounter (Signed)
Called Madeline Howard and advised her that Dr. Alvy Bimler has placed an order to have her port removed and that she will be getting a call to schedule it.  Also advised her of CT Chest apt on 01/13/21 at 10:30 (arrival at 10:15, liquids only 4 hours before).

## 2020-09-02 ENCOUNTER — Other Ambulatory Visit: Payer: Self-pay | Admitting: Radiology

## 2020-09-03 ENCOUNTER — Other Ambulatory Visit: Payer: Self-pay

## 2020-09-03 ENCOUNTER — Encounter (HOSPITAL_COMMUNITY): Payer: Self-pay

## 2020-09-03 ENCOUNTER — Ambulatory Visit (HOSPITAL_COMMUNITY)
Admission: RE | Admit: 2020-09-03 | Discharge: 2020-09-03 | Disposition: A | Discharge status: Home / Self Care | Payer: Medicaid Other | Source: Ambulatory Visit | Attending: Hematology and Oncology | Admitting: Hematology and Oncology

## 2020-09-03 ENCOUNTER — Ambulatory Visit (HOSPITAL_COMMUNITY)
Admission: RE | Admit: 2020-09-03 | Discharge: 2020-09-03 | Disposition: A | Discharge status: Home / Self Care | Payer: Medicaid Other | Source: Ambulatory Visit | Attending: Obstetrics & Gynecology | Admitting: Obstetrics & Gynecology

## 2020-09-03 DIAGNOSIS — C539 Malignant neoplasm of cervix uteri, unspecified: Secondary | ICD-10-CM | POA: Insufficient documentation

## 2020-09-03 DIAGNOSIS — Z87891 Personal history of nicotine dependence: Secondary | ICD-10-CM | POA: Insufficient documentation

## 2020-09-03 DIAGNOSIS — Z452 Encounter for adjustment and management of vascular access device: Secondary | ICD-10-CM | POA: Insufficient documentation

## 2020-09-03 DIAGNOSIS — Z79899 Other long term (current) drug therapy: Secondary | ICD-10-CM | POA: Insufficient documentation

## 2020-09-03 HISTORY — PX: IR REMOVAL TUN ACCESS W/ PORT W/O FL MOD SED: IMG2290

## 2020-09-03 MED ORDER — FENTANYL CITRATE (PF) 100 MCG/2ML IJ SOLN
INTRAMUSCULAR | Status: AC
  Filled 2020-09-03: qty 2

## 2020-09-03 MED ORDER — LIDOCAINE-EPINEPHRINE 1 %-1:100000 IJ SOLN
INTRAMUSCULAR | Status: AC | PRN
  Administered 2020-09-03: 10 mL

## 2020-09-03 MED ORDER — MIDAZOLAM HCL 2 MG/2ML IJ SOLN
INTRAMUSCULAR | Status: AC
  Filled 2020-09-03: qty 2

## 2020-09-03 MED ORDER — FENTANYL CITRATE (PF) 100 MCG/2ML IJ SOLN
INTRAMUSCULAR | Status: AC | PRN
  Administered 2020-09-03 (×2): 50 ug via INTRAVENOUS

## 2020-09-03 MED ORDER — SODIUM CHLORIDE 0.9 % IV SOLN: INTRAVENOUS | Status: DC

## 2020-09-03 MED ORDER — MIDAZOLAM HCL 2 MG/2ML IJ SOLN
INTRAMUSCULAR | Status: AC | PRN
Start: 2020-09-03 — End: 2020-09-03
  Administered 2020-09-03 (×2): 1 mg via INTRAVENOUS

## 2020-09-03 MED ORDER — LIDOCAINE-EPINEPHRINE 1 %-1:100000 IJ SOLN
INTRAMUSCULAR | Status: AC
  Filled 2020-09-03: qty 1

## 2020-09-03 NOTE — Discharge Instructions (Signed)
Please call Interventional Radiology clinic [phone] with any questions or concerns.  You may remove your dressing and shower tomorrow.   Implanted Port Removal, Care After This sheet gives you information about how to care for yourself after your procedure. Your health care provider may also give you more specific instructions. If you have problems or questions, contact your health care provider. What can I expect after the procedure? After the procedure, it is common to have: Soreness or pain near your incision. Some swelling or bruising near your incision. Follow these instructions at home: Medicines Take over-the-counter and prescription medicines only as told by your health care provider. If you were prescribed an antibiotic medicine, take it as told by your health care provider. Do not stop taking the antibiotic even if you start to feel better. Bathing Do not take baths, swim, or use a hot tub until your health care provider approves. Ask your health care provider if you can take showers. You may only be allowed to take sponge baths. Incision care Follow instructions from your health care provider about how to take care of your incision. Make sure you: Wash your hands with soap and water before you change your bandage (dressing). If soap and water are not available, use hand sanitizer. Change your dressing as told by your health care provider. Keep your dressing dry. Leave stitches (sutures), skin glue, or adhesive strips in place. These skin closures may need to stay in place for 2 weeks or longer. If adhesive strip edges start to loosen and curl up, you may trim the loose edges. Do not remove adhesive strips completely unless your health care provider tells you to do that. Check your incision area every day for signs of infection. Check for: More redness, swelling, or pain. More fluid or blood. Warmth. Pus or a bad smell.    Driving Do not drive for 24 hours if you were  given a medicine to help you relax (sedative) during your procedure. If you did not receive a sedative, ask your health care provider when it is safe to drive.    Activity Return to your normal activities as told by your health care provider. Ask your health care provider what activities are safe for you. Do not lift anything that is heavier than 10 lb (4.5 kg), or the limit that you are told, until your health care provider says that it is safe. Do not do activities that involve lifting your arms over your head. General instructions Do not use any products that contain nicotine or tobacco, such as cigarettes and e-cigarettes. These can delay healing. If you need help quitting, ask your health care provider. Keep all follow-up visits as told by your health care provider. This is important. Contact a health care provider if: You have more redness, swelling, or pain around your incision. You have more fluid or blood coming from your incision. Your incision feels warm to the touch. You have pus or a bad smell coming from your incision. You have pain that is not relieved by your pain medicine. Get help right away if you have: A fever or chills. Chest pain. Difficulty breathing. Summary After the procedure, it is common to have pain, soreness, swelling, or bruising near your incision. If you were prescribed an antibiotic medicine, take it as told by your health care provider. Do not stop taking the antibiotic even if you start to feel better. Do not drive for 24 hours if you were given a sedative during   your procedure. Return to your normal activities as told by your health care provider. Ask your health care provider what activities are safe for you. This information is not intended to replace advice given to you by your health care provider. Make sure you discuss any questions you have with your health care provider. Document Revised: 03/11/2017 Document Reviewed: 03/11/2017 Elsevier Patient  Education  2021 Elsevier Inc.   Moderate Conscious Sedation, Adult, Care After This sheet gives you information about how to care for yourself after your procedure. Your health care provider may also give you more specific instructions. If you have problems or questions, contact your health care provider. What can I expect after the procedure? After the procedure, it is common to have: Sleepiness for several hours. Impaired judgment for several hours. Difficulty with balance. Vomiting if you eat too soon. Follow these instructions at home: For the time period you were told by your health care provider: Rest. Do not participate in activities where you could fall or become injured. Do not drive or use machinery. Do not drink alcohol. Do not take sleeping pills or medicines that cause drowsiness. Do not make important decisions or sign legal documents. Do not take care of children on your own.        Eating and drinking Follow the diet recommended by your health care provider. Drink enough fluid to keep your urine pale yellow. If you vomit: Drink water, juice, or soup when you can drink without vomiting. Make sure you have little or no nausea before eating solid foods.    General instructions Take over-the-counter and prescription medicines only as told by your health care provider. Have a responsible adult stay with you for the time you are told. It is important to have someone help care for you until you are awake and alert. Do not smoke. Keep all follow-up visits as told by your health care provider. This is important. Contact a health care provider if: You are still sleepy or having trouble with balance after 24 hours. You feel light-headed. You keep feeling nauseous or you keep vomiting. You develop a rash. You have a fever. You have redness or swelling around the IV site. Get help right away if: You have trouble breathing. You have new-onset confusion at  home. Summary After the procedure, it is common to feel sleepy, have impaired judgment, or feel nauseous if you eat too soon. Rest after you get home. Know the things you should not do after the procedure. Follow the diet recommended by your health care provider and drink enough fluid to keep your urine pale yellow. Get help right away if you have trouble breathing or new-onset confusion at home. This information is not intended to replace advice given to you by your health care provider. Make sure you discuss any questions you have with your health care provider. Document Revised: 05/26/2019 Document Reviewed: 12/22/2018 Elsevier Patient Education  2021 Elsevier Inc.   

## 2020-09-03 NOTE — Procedures (Signed)
  Procedure: R chest port removal   EBL:   minimal Complications:  none immediate  See full dictation in [facility].  Dillard Cannon MD Main # [phone] Pager  [phone]

## 2020-09-03 NOTE — H&P (Signed)
Chief Complaint: Patient was seen in consultation today for  at the request of [place]  Referring Physician(s): Heath Lark  Supervising Physician: Arne Cleveland  Patient Status: [facility] - Out-pt  History of Present Illness: Madeline Howard is a 55 y.o. female with hx of cervical cancer. She had a port placed in January of this year and has completed therapy. She is referred for port removal. PMHx, meds, labs, imaging, allergies reviewed. Feels well, no recent fevers, chills, illness. Has been NPO today as directed.   Past Medical History:  Diagnosis Date   Allergy    Cervical cancer ([place])    COVID 02/2019   loss of taste and smell still present   GERD (gastroesophageal reflux disease)    History of radiation therapy 02/28/20-04/08/20   IMRT to endocervix-  Dr. Gery Pray    History of radiation therapy 04/18/2020-05/16/2020   vaginal brachytherapy (HDR) to endocervix     Dr Gery Pray   Hypertension    Wears glasses     Past Surgical History:  Procedure Laterality Date   IR IMAGING GUIDED PORT INSERTION  02/13/2020   OPERATIVE ULTRASOUND N/A 04/18/2020   Procedure: OPERATIVE ULTRASOUND;  Surgeon: Gery Pray, MD;  Location: [facility];  Service: Urology;  Laterality: N/A;   OPERATIVE ULTRASOUND N/A 04/22/2020   Procedure: OPERATIVE ULTRASOUND;  Surgeon: Gery Pray, MD;  Location: [facility];  Service: Urology;  Laterality: N/A;   OPERATIVE ULTRASOUND N/A 05/02/2020   Procedure: OPERATIVE ULTRASOUND;  Surgeon: Gery Pray, MD;  Location: [facility];  Service: Urology;  Laterality: N/A;   OPERATIVE ULTRASOUND N/A 05/09/2020   Procedure: OPERATIVE ULTRASOUND;  Surgeon: Gery Pray, MD;  Location: [facility];  Service: Urology;  Laterality: N/A;   OPERATIVE ULTRASOUND N/A 05/16/2020   Procedure: OPERATIVE ULTRASOUND;  Surgeon: Gery Pray, MD;  Location: [facility];  Service: Urology;   Laterality: N/A;   TANDEM RING INSERTION N/A 04/18/2020   Procedure: TANDEM RING INSERTION;  Surgeon: Gery Pray, MD;  Location: [facility];  Service: Urology;  Laterality: N/A;   TANDEM RING INSERTION N/A 04/22/2020   Procedure: TANDEM RING INSERTION;  Surgeon: Gery Pray, MD;  Location: [facility];  Service: Urology;  Laterality: N/A;   TANDEM RING INSERTION N/A 05/02/2020   Procedure: TANDEM RING INSERTION;  Surgeon: Gery Pray, MD;  Location: [facility];  Service: Urology;  Laterality: N/A;   TANDEM RING INSERTION N/A 05/09/2020   Procedure: TANDEM RING INSERTION;  Surgeon: Gery Pray, MD;  Location: [facility];  Service: Urology;  Laterality: N/A;   TANDEM RING INSERTION N/A 05/16/2020   Procedure: TANDEM RING INSERTION;  Surgeon: Gery Pray, MD;  Location: [facility];  Service: Urology;  Laterality: N/A;    Allergies: Patient has no known allergies.  Medications: Prior to Admission medications   Medication Sig Start Date End Date Taking? Authorizing Provider  amLODipine (NORVASC) 10 MG tablet Take 1 tablet (10 mg total) by mouth daily. 08/16/20  Yes Gorsuch, Ni, MD  Chlorpheniramine Maleate (ALLERGY PO) Take 1 tablet by mouth daily.   Yes [provider]  famotidine (PEPCID) 20 MG tablet Take 20 mg by mouth daily. OTC   Yes [provider]  Multiple Vitamin (MULTIVITAMIN) capsule Take 1 capsule by mouth daily.   Yes [provider]  RESTASIS 0.05 % ophthalmic emulsion 1 drop as needed. Patient not taking: No sig reported 04/16/20  [provider]     Family History  Problem Relation Age of Onset   Cancer Mother        colon   Diabetes Father    Cancer Brother        melanoma   Breast cancer Neg Hx    Uterine cancer Neg Hx    Ovarian cancer Neg Hx     Social History   Socioeconomic History   Marital status: Significant Other    Spouse name: Toy Baker   Number of children: 2   Years of education: Not on file   Highest education level: Not on file  Occupational History   Occupation: unemployed    Comment: Psychologist, educational  Tobacco Use   Smoking status: Former    Packs/day: 1.50    Years: 15.00    Pack years: 22.50    Types: Cigarettes, E-cigarettes    Quit date: 07/15/2016    Years since quitting: 4.1   Smokeless tobacco: Never  Vaping Use   Vaping Use: Every day   Start date: 07/15/2016   Substances: Nicotine, Flavoring  Substance and Sexual Activity   Alcohol use: Never   Drug use: Never   Sexual activity: Yes    Birth control/protection: None  Other Topics Concern   Not on file  Social History Narrative   Not on file   Social Determinants of Health   Financial Resource Strain: Not on file  Food Insecurity: Not on file  Transportation Needs: Not on file  Physical Activity: Not on file  Stress: Not on file  Social Connections: Not on file     Review of Systems: A 12 point ROS discussed and pertinent positives are indicated in the HPI above.  All other systems are negative.  Review of Systems  Vital Signs: BP (!) 178/85 (BP Location: Right Arm)   Pulse 79   Temp 98.2 F (36.8 C) (Oral)   Resp 16   SpO2 100%   Physical Exam Constitutional:      Appearance: Normal appearance.  HENT:     Mouth/Throat:     Mouth: Mucous membranes are moist.     Pharynx: Oropharynx is clear.  Cardiovascular:     Rate and Rhythm: Normal rate and regular rhythm.     Heart sounds: Normal heart sounds.  Pulmonary:     Effort: Pulmonary effort is normal. No respiratory distress.     Breath sounds: Normal breath sounds.  Skin:    General: Skin is warm and dry.     Comments: (R)chest port palpable, well healed incision  Neurological:     General: No focal deficit present.     Mental Status: She is alert and oriented to person, place, and time.  Psychiatric:        Mood and Affect: Mood normal.        Thought  Content: Thought content normal.        Judgment: Judgment normal.    Imaging: NM PET Image Restage (PS) Skull Base to Thigh  Result Date: 08/15/2020 CLINICAL DATA:  Subsequent treatment strategy for uterine/cervical cancer assess treatment response. 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 EXAM: NUCLEAR MEDICINE PET SKULL BASE TO THIGH TECHNIQUE: 8.03 mCi F-18 FDG was injected intravenously. Full-ring PET imaging was performed from the skull base to thigh after the radiotracer. CT data was obtained and used for attenuation correction and anatomic localization. Fasting blood glucose: 97 mg/dl COMPARISON:  02/21/2020. FINDINGS: Mediastinal blood pool activity: SUV max 2.11 Liver activity: SUV max NA NECK: No hypermetabolic lymph nodes in the neck. Incidental CT findings: none CHEST: No hypermetabolic mediastinal or hilar nodes. Tiny pulmonary nodule 3-4 mm at the LEFT lung apex not seen on the prior study (image 17/8) not associated with increased FDG uptake. No additional new pulmonary nodules. Incidental CT findings: Marked dilation of main pulmonary artery as on the previous exam. Airways are patent. No consolidation. No pleural effusion. Mildly patulous esophagus. Scattered aortic atherosclerosis. ABDOMEN/PELVIS: No sign of solid organ metastasis. No signs of FDG avid disease involving lymph nodes in the abdomen or pelvis. Area of concern in the cervix and uterus and upper vagina with resolution of much of the FDG uptake that was present previously. The area appears less bulky. Maximum SUV in this location at 2.6 as compared to 12.2 on the previous study. In area along the upper vagina towards the vaginal apex with a maximum SUV of 4.1 again previously greater than 20 in this location. No definable mass with mild fullness. No increased metabolic activity along the RIGHT pelvic sidewall. Mildly enlarged lymph node along the RIGHT common iliac chain on the prior  study at 7 mm previously now at 5 mm. (Image 149/4) uptake similar to adjacent vasculature, less than cardiac blood Incidental CT findings: Liver with smooth contours. No acute abnormality related to gallbladder, pancreas, spleen, adrenal glands, kidneys, urinary bladder, stomach, small and large bowel. Aortic atherosclerosis without aneurysmal dilation. Smooth contour of the IVC. Decreased bulk of the uterus and cervix compared to the previous study. No ascites. No free air. SKELETON: No focal hypermetabolic activity to suggest skeletal metastasis. Incidental CT findings: Spinal degenerative changes. IMPRESSION: Marked interval response to therapy is with no definable mass in the area of cervix and vagina on CT images, decreased bulk of the uterus and cervix and FDG uptake only slightly greater than blood pool activity throughout the area with the exception of the upper vagina which shows mildly increased FDG uptake which is nonspecific in the setting of radiotherapy and greatly improved. Attention on follow-up. No signs of nodal or visceral metastasis. New tiny LEFT upper lobe pulmonary nodule is nonspecific, atypical location for metastatic focus and without FDG uptake though with limited assessment based on small size. Consider short interval follow-up given patient history. 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 Electronically Signed   By: Zetta Bills M.D.   On: 08/15/2020 08:56    Labs:  CBC: Recent Labs    03/27/20 1520 04/03/20 1456 04/18/20 0802 05/02/20 0752 05/09/20 0600 05/16/20 0610 05/29/20 1218  WBC 2.8* 2.7* 3.8*  --   --   --  4.8  HGB 9.9* 10.1* 11.4* 12.6 13.3 12.9 11.9*  HCT 29.7* 29.9* 34.7* 37.0 39.0 38.0 35.7*  PLT 127* 163 221  --   --   --  159    COAGS: Recent Labs    02/13/20 1222  INR 1.0    BMP: Recent Labs    03/27/20 1520 04/03/20 1456 04/18/20 0837 05/02/20 0752 05/09/20 0600 05/16/20 0610 05/29/20 1218   NA 136 137 134* 141 143 141 140  K 3.5 3.7 3.8 4.0 3.2* 3.9 4.1  CL 106 104 103 102 103 104 107  CO2 '23 25 23  '$ --   --   --  23  GLUCOSE 121* 96 98 91 76 95 99  BUN '6 7 10 '$ 5* <3* 6 8  CALCIUM 8.5* 8.8* 9.0  --   --   --  8.9  CREATININE 0.63 0.62 0.58 0.50 0.60 0.50 0.67  GFRNONAA >60 >60 >60  --   --   --  >60    LIVER FUNCTION TESTS: Recent Labs    01/12/20 1456 02/13/20 1222  BILITOT 0.4 0.3  AST 47* 41  ALT 36 24  ALKPHOS 66 60  PROT 9.1* 8.9*  ALBUMIN 3.9 4.1    TUMOR MARKERS: No results for input(s): AFPTM, CEA, CA199, CHROMGRNA in the last 8760 hours.  Assessment and Plan: Hx of cervical cancer Plan for port removal. Risks and benefits of port-a-catheter removal was discussed with the patient including, but not limited to bleeding, infection, pneumothorax, or fibrin sheath development and need for additional procedures.  All of the patient's questions were answered, patient is agreeable to proceed. Consent signed and in chart.   Thank you for this interesting consult.  I greatly enjoyed meeting Madeline Howard and look forward to participating in their care.  A copy of this report was sent to the requesting provider on this date.  Electronically Signed: Ascencion Dike, PA-C 09/03/2020, 2:22 PM   I spent a total of 20 minutes in face to face in clinical consultation, greater than 50% of which was counseling/coordinating care for port removal

## 2020-11-20 ENCOUNTER — Telehealth: Payer: Self-pay | Admitting: *Deleted

## 2020-11-20 NOTE — Telephone Encounter (Signed)
RETURNED PATIENT'S PHONE CALL AND INFORMED HER THAT DR. KINARD WOULD BE GLAD TO SEE HER ON Tuesday 11-26-20 @ 3:30 PM, PATIENT AGREED TO COME ON THAT DAY AND AT THAT TIME

## 2020-11-20 NOTE — Telephone Encounter (Signed)
RETURNED PATIENT'S PHONE CALL, SPOKE WITH PATIENT. ?

## 2020-11-25 ENCOUNTER — Ambulatory Visit: Payer: Self-pay | Admitting: Radiation Oncology

## 2020-11-26 ENCOUNTER — Ambulatory Visit
Admission: RE | Admit: 2020-11-26 | Discharge: 2020-11-26 | Disposition: A | Discharge status: Home / Self Care | Payer: Medicaid Other | Source: Ambulatory Visit | Attending: Radiation Oncology | Admitting: Radiation Oncology

## 2020-11-27 ENCOUNTER — Telehealth: Payer: Self-pay | Admitting: *Deleted

## 2020-11-27 NOTE — Telephone Encounter (Signed)
CALLED PATIENT TO ASK ABOUT RESCHEDULING MISSED FU, LVM FOR A RETURN CALL 

## 2020-12-15 ENCOUNTER — Other Ambulatory Visit: Payer: Self-pay | Admitting: Hematology and Oncology

## 2020-12-16 NOTE — Telephone Encounter (Signed)
Future refills with primary care doctor

## 2021-01-13 ENCOUNTER — Ambulatory Visit (HOSPITAL_COMMUNITY): Payer: Medicaid Other

## 2021-01-15 ENCOUNTER — Telehealth: Payer: Self-pay | Admitting: Oncology

## 2021-01-15 NOTE — Telephone Encounter (Signed)
Madeline Howard with rescheduled CT scan appointment on 01/23/21 at 5:00 (arrive at 4:45, liquids only 4 hours before). She said she got a new phone and was not able to see her appointments.  She is appreciative for the rescheduling.

## 2021-01-23 ENCOUNTER — Ambulatory Visit (HOSPITAL_COMMUNITY): Payer: Medicaid Other

## 2021-02-17 ENCOUNTER — Ambulatory Visit (HOSPITAL_COMMUNITY): Payer: Medicaid Other

## 2021-02-18 ENCOUNTER — Telehealth: Payer: Self-pay | Admitting: *Deleted

## 2021-02-18 NOTE — Telephone Encounter (Signed)
Rescheduled patient's missed CT chest appt for 1/26 at 1:30 pm. Patient also scheduled for lab and MD visit the same day

## 2021-03-04 ENCOUNTER — Encounter: Payer: Self-pay | Admitting: Gynecologic Oncology

## 2021-03-05 ENCOUNTER — Encounter: Payer: Self-pay | Admitting: Oncology

## 2021-03-05 DIAGNOSIS — C539 Malignant neoplasm of cervix uteri, unspecified: Secondary | ICD-10-CM

## 2021-03-05 NOTE — Progress Notes (Signed)
BMP order placed for CT scan on 03/06/21.

## 2021-03-06 ENCOUNTER — Ambulatory Visit (HOSPITAL_COMMUNITY)
Admission: RE | Admit: 2021-03-06 | Discharge: 2021-03-06 | Disposition: A | Discharge status: Home / Self Care | Payer: Medicaid Other | Source: Ambulatory Visit | Attending: Gynecologic Oncology | Admitting: Gynecologic Oncology

## 2021-03-06 ENCOUNTER — Inpatient Hospital Stay (HOSPITAL_BASED_OUTPATIENT_CLINIC_OR_DEPARTMENT_OTHER): Payer: Medicaid Other | Admitting: Gynecologic Oncology

## 2021-03-06 ENCOUNTER — Other Ambulatory Visit: Payer: Self-pay

## 2021-03-06 ENCOUNTER — Inpatient Hospital Stay: Payer: Medicaid Other | Attending: Gynecologic Oncology

## 2021-03-06 VITALS — BP 150/83 | HR 77 | Temp 99.1°F | Resp 17 | Wt 165.4 lb

## 2021-03-06 DIAGNOSIS — C539 Malignant neoplasm of cervix uteri, unspecified: Secondary | ICD-10-CM

## 2021-03-06 DIAGNOSIS — Z08 Encounter for follow-up examination after completed treatment for malignant neoplasm: Secondary | ICD-10-CM | POA: Diagnosis not present

## 2021-03-06 DIAGNOSIS — Z79899 Other long term (current) drug therapy: Secondary | ICD-10-CM | POA: Insufficient documentation

## 2021-03-06 DIAGNOSIS — Z9221 Personal history of antineoplastic chemotherapy: Secondary | ICD-10-CM | POA: Diagnosis not present

## 2021-03-06 DIAGNOSIS — I10 Essential (primary) hypertension: Secondary | ICD-10-CM | POA: Diagnosis not present

## 2021-03-06 DIAGNOSIS — R911 Solitary pulmonary nodule: Secondary | ICD-10-CM

## 2021-03-06 DIAGNOSIS — Z8541 Personal history of malignant neoplasm of cervix uteri: Secondary | ICD-10-CM | POA: Insufficient documentation

## 2021-03-06 DIAGNOSIS — Z8616 Personal history of COVID-19: Secondary | ICD-10-CM | POA: Insufficient documentation

## 2021-03-06 DIAGNOSIS — K219 Gastro-esophageal reflux disease without esophagitis: Secondary | ICD-10-CM | POA: Diagnosis not present

## 2021-03-06 DIAGNOSIS — Z923 Personal history of irradiation: Secondary | ICD-10-CM | POA: Insufficient documentation

## 2021-03-06 LAB — BASIC METABOLIC PANEL - CANCER CENTER ONLY
Anion gap: 6 (ref 5–15)
BUN: 11 mg/dL (ref 6–20)
CO2: 28 mmol/L (ref 22–32)
Calcium: 9.4 mg/dL (ref 8.9–10.3)
Chloride: 103 mmol/L (ref 98–111)
Creatinine: 0.61 mg/dL (ref 0.44–1.00)
GFR, Estimated: >60 mL/min (ref >60)
Glucose, Bld: 99 mg/dL (ref 70–99)
Potassium: 4.2 mmol/L (ref 3.5–5.1)
Sodium: 137 mmol/L (ref 135–145)

## 2021-03-06 MED ORDER — SODIUM CHLORIDE (PF) 0.9 % IJ SOLN
INTRAMUSCULAR | Status: AC
  Filled 2021-03-06: qty 50

## 2021-03-06 MED ORDER — IOHEXOL 350 MG/ML SOLN
60.0000 mL | Freq: Once | INTRAVENOUS | Status: AC | PRN
  Administered 2021-03-06: 60 mL via INTRAVENOUS

## 2021-03-06 NOTE — Progress Notes (Signed)
Gynecologic Oncology Return Clinic Visit  03/06/21  Reason for Visit: surveillance visit in the setting of locally advanced cervix cancer  Treatment History: Oncology History  Malignant neoplasm of cervix ([place])  12/11/2019 Initial Diagnosis   She presented with postmenopausal bleeding   01/12/2020 Initial Diagnosis   Malignant neoplasm of cervix ([place])   01/12/2020 Pathology Results   FINAL MICROSCOPIC DIAGNOSIS:   A. CERVIX, 3:00, 5:00, BIOPSY:  -  Invasive squamous cell carcinoma arising in a background of high-grade dysplasia  -  See comment   B.  ENDOCERVIX, CURETTAGE:  -  High grade squamous intraepithelial lesion (CIN 3; severe dysplasia/carcinoma in situ)    01/17/2020 Pathology Results   FINAL MICROSCOPIC DIAGNOSIS:   A. VAGINOCERVICAL JUNCTION, 6:00, BIOPSY:  - High grade squamous intraepithelial lesion, CIN-II/VAIN-II (moderate dysplasia).   B. VAGINOCERVICAL JUNCTION, 4:00, BIOPSY:  - High grade squamous intraepithelial lesion, CIN-III/VAIN-III (severe dysplasia/CIS).   C. VAGINOCERVICAL JUNCTION, 12:00, BIOPSY:  - Invasive squamous cell carcinoma, see comment.   D. ENDOMETRIUM, CURETTAGE:  - Predominately blood with fragments of high grade squamous intraepithelial lesion.   E. ENDOCERVIX, CURETTAGE:  - Invasive squamous cell carcinoma, see comment   01/17/2020 Surgery   Postoperative Diagnosis: Clinical stage IIB disease due to parametria thickening   Surgery: EUA, multiple cervical biopsies, ECC, EMB    Surgeons:  Jeral Pinch MD  Pathology: Biopsies at 4 and 6  o'c at the posterior cervicovaginal junction, biopsy from anterior cervix at 41 o'c, ECC, EMB   Operative findings: Cervix barrel shaped and firm, visible lesion that is friable spanning most of the posterior cervix (approximately 2 x 3.5cm). Cervix is flush with the vagina posteriorly; lesion abuts the cervico-vaginal junction. Very gritty endocervical canal noted on ECC. EMB revealed somewhat  purulent appearing blood. On rectovaginal exam, concerning for thickening posteriorly in the midline and to the left, concerning for parametria involvement.   01/26/2020 Cancer Staging   Staging form: Cervix Uteri, AJCC Version 9 - Clinical stage from 01/26/2020: FIGO Stage IIB (cT2b, cN0, cM0) - Signed by Heath Lark, MD on 01/29/2020    01/26/2020 Imaging   1. Known cervical malignancy is not well appreciated. There is no evidence of lymphadenopathy or metastatic disease in the abdomen or pelvis. 2. Probable fibroid of the anterior uterine body, not well delineated by CT.   Aortic Atherosclerosis (ICD10-I70.0).     02/13/2020 Procedure   Successful placement of a power injectable Port-A-Cath via the right internal jugular vein. The catheter is ready for immediate use.   02/21/2020 PET scan   1. Cervical and lower uterine segment mass with maximum SUV 12.2, compatible with malignancy. 2. Although the 7 mm right common iliac node questioned on recent pelvic MRI has only low-grade activity similar to blood pool on today's examination, there is a small focus of cutaneous nodularity along the lateral margin of the left labium majus which could conceivably represent a small cutaneous metastatic lymph node, although urinary contamination can sometimes cause a similar appearance. Physical exam inspection of this region along the skin fold between the left labium majus and left upper thigh region with attention to any underlying nodularity is suggested. 3. 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 If this is not a known condition, cardiology referral for further workup may be warranted. 4.  Aortic Atherosclerosis (ICD10-I70.0).   02/23/2020 - 03/29/2020 Chemotherapy    Patient is on Treatment Plan: CERVICAL CISPLATIN Q7D       02/28/2020 -  05/16/2020 Radiation Therapy   Radiation Treatment Dates: 02/28/2020 through 05/16/2020   Site: Pelvis Technique: 3D Total Dose  (Gy): 45/45 Dose per Fx (Gy): 1.8 Completed Fx: 25/25 Beam Energies: 15X   Site: Right pelvis boost Technique: 3D Total Dose (Gy): 5.4/5.4 Dose per Fx (Gy): 1.8 Completed Fx: 3/3 Beam Energies: 15X   Site: Cervix; boost Technique: HDR-brachytherapy Total Dose (Gy): 27.5/27.5 Dose per Fx (Gy): 5.5 Completed Fx: 5/5 Beam Energies: Ir-192   08/15/2020 PET scan   Marked interval response to therapy is with no definable mass in the area of cervix and vagina on CT images, decreased bulk of the uterus and cervix and FDG uptake only slightly greater than blood pool activity throughout the area with the exception of the upper vagina which shows mildly increased FDG uptake which is nonspecific in the setting of radiotherapy and greatly improved. Attention on follow-up.   No signs of nodal or visceral metastasis.   New tiny LEFT upper lobe pulmonary nodule is nonspecific, atypical location for metastatic focus and without FDG uptake though with limited assessment based on small size. Consider short interval follow-up given patient history.   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   09/03/2020 Procedure   Technically successful tunneled Port catheter removal     Interval History: Patient presents today for follow-up.  She notes overall doing well.  She denies any vaginal bleeding or discharge.  Reports regular bowel and bladder function.  Endorses a good appetite without nausea or emesis.  Still notes that she has very little taste.  Is using her vaginal dilator once a week.   Past Medical/Surgical History: Past Medical History:  Diagnosis Date   Allergy    Cervical cancer ([place])    COVID 02/2019   loss of taste and smell still present   GERD (gastroesophageal reflux disease)    History of radiation therapy 02/28/20-04/08/20   IMRT to endocervix-  Dr. Gery Pray    History of radiation therapy 04/18/2020-05/16/2020   vaginal brachytherapy (HDR)  to endocervix     Dr Gery Pray   Hypertension    Wears glasses     Past Surgical History:  Procedure Laterality Date   IR IMAGING GUIDED PORT INSERTION  02/13/2020   IR REMOVAL TUN ACCESS W/ PORT W/O FL MOD SED  09/03/2020   OPERATIVE ULTRASOUND N/A 04/18/2020   Procedure: OPERATIVE ULTRASOUND;  Surgeon: Gery Pray, MD;  Location: [facility];  Service: Urology;  Laterality: N/A;   OPERATIVE ULTRASOUND N/A 04/22/2020   Procedure: OPERATIVE ULTRASOUND;  Surgeon: Gery Pray, MD;  Location: [facility];  Service: Urology;  Laterality: N/A;   OPERATIVE ULTRASOUND N/A 05/02/2020   Procedure: OPERATIVE ULTRASOUND;  Surgeon: Gery Pray, MD;  Location: [facility];  Service: Urology;  Laterality: N/A;   OPERATIVE ULTRASOUND N/A 05/09/2020   Procedure: OPERATIVE ULTRASOUND;  Surgeon: Gery Pray, MD;  Location: [facility];  Service: Urology;  Laterality: N/A;   OPERATIVE ULTRASOUND N/A 05/16/2020   Procedure: OPERATIVE ULTRASOUND;  Surgeon: Gery Pray, MD;  Location: [facility];  Service: Urology;  Laterality: N/A;   TANDEM RING INSERTION N/A 04/18/2020   Procedure: TANDEM RING INSERTION;  Surgeon: Gery Pray, MD;  Location: [facility];  Service: Urology;  Laterality: N/A;   TANDEM RING INSERTION N/A 04/22/2020   Procedure: TANDEM RING INSERTION;  Surgeon: Gery Pray, MD;  Location: [facility];  Service:  Urology;  Laterality: N/A;   TANDEM RING INSERTION N/A 05/02/2020   Procedure: TANDEM RING INSERTION;  Surgeon: Gery Pray, MD;  Location: [facility];  Service: Urology;  Laterality: N/A;   TANDEM RING INSERTION N/A 05/09/2020   Procedure: TANDEM RING INSERTION;  Surgeon: Gery Pray, MD;  Location: [facility];  Service: Urology;  Laterality: N/A;   TANDEM RING INSERTION N/A 05/16/2020   Procedure: TANDEM RING INSERTION;  Surgeon: Gery Pray,  MD;  Location: [facility];  Service: Urology;  Laterality: N/A;    Family History  Problem Relation Age of Onset   Cancer Mother        colon   Diabetes Father    Cancer Brother        melanoma   Breast cancer Neg Hx    Uterine cancer Neg Hx    Ovarian cancer Neg Hx     Social History   Socioeconomic History   Marital status: Significant Other    Spouse name: Toy Baker   Number of children: 2   Years of education: Not on file   Highest education level: Not on file  Occupational History   Occupation: unemployed    Comment: Secondary school teacher and Wheel  Tobacco Use   Smoking status: Former    Packs/day: 1.50    Years: 15.00    Pack years: 22.50    Types: Cigarettes, E-cigarettes    Quit date: 07/15/2016    Years since quitting: 4.6   Smokeless tobacco: Never  Vaping Use   Vaping Use: Every day   Start date: 07/15/2016   Substances: Nicotine, Flavoring  Substance and Sexual Activity   Alcohol use: Never   Drug use: Never   Sexual activity: Yes    Birth control/protection: None  Other Topics Concern   Not on file  Social History Narrative   Not on file   Social Determinants of Health   Financial Resource Strain: Not on file  Food Insecurity: Not on file  Transportation Needs: Not on file  Physical Activity: Not on file  Stress: Not on file  Social Connections: Not on file    Current Medications:  Current Outpatient Medications:    amLODipine (NORVASC) 10 MG tablet, TAKE 1 TABLET BY MOUTH EVERY DAY, Disp: 30 tablet, Rfl: 0   Chlorpheniramine Maleate (ALLERGY PO), Take 1 tablet by mouth daily., Disp: , Rfl:    famotidine (PEPCID) 20 MG tablet, Take 20 mg by mouth daily. OTC, Disp: , Rfl:    Multiple Vitamin (MULTIVITAMIN) capsule, Take 1 capsule by mouth daily., Disp: , Rfl:    RESTASIS 0.05 % ophthalmic emulsion, 1 drop as needed., Disp: , Rfl:  No current facility-administered medications for this visit.  Facility-Administered Medications Ordered  in Other Visits:    sodium chloride (PF) 0.9 % injection, , , ,   Review of Systems: Denies appetite changes, fevers, chills, fatigue, unexplained weight changes. Denies hearing loss, neck lumps or masses, mouth sores, ringing in ears or voice changes. Denies cough or wheezing.  Denies shortness of breath. Denies chest pain or palpitations. Denies leg swelling. Denies abdominal distention, pain, blood in stools, constipation, diarrhea, nausea, vomiting, or early satiety. Denies pain with intercourse, dysuria, frequency, hematuria or incontinence. Denies hot flashes, pelvic pain, vaginal bleeding or vaginal discharge.   Denies joint pain, back pain or muscle pain/cramps. Denies itching, rash, or wounds. Denies dizziness, headaches, numbness or seizures. Denies swollen lymph nodes or glands, denies easy bruising or  bleeding. Denies anxiety, depression, confusion, or decreased concentration.  Physical Exam: BP (!) 150/83 (BP Location: Left Arm, Patient Position: Sitting)    Pulse 77    Temp 99.1 F (37.3 C) (Oral)    Resp 17    Wt 165 lb 7 oz (75 kg)    SpO2 97%    BMI 26.70 kg/m  General: Alert, oriented, no acute distress. HEENT: Normocephalic, atraumatic, sclera anicteric. Chest: Clear to auscultation bilaterally.  No wheezes or rhonchi. Cardiovascular: Regular rate and rhythm, no murmurs. Abdomen: soft, nontender.  Normoactive bowel sounds.  No masses or hepatosplenomegaly appreciated.   Extremities: Grossly normal range of motion.  Warm, well perfused.  No edema bilaterally. Skin: No rashes or lesions. Lymphatics: No cervical, supraclavicular, or inguinal adenopathy. GU: Normal appearing external genitalia although with some changes on the left mons and vulva consistent with radiation.  Speculum exam reveals moderate atrophy and radiation changes noted.  Cervix is overall smooth without masses, no bleeding or discharge.  Bimanual exam reveals cervix smooth, no nodularity or firmness.   Rectovaginal exam confirms these findings.  Laboratory & Radiologic Studies: CT of the chest pending from today  Assessment & Plan: Madeline Howard is a 56 y.o. woman with Stage IIB SCC of the cervix who presents for surveillance visit.  Patient continues to do very well and is NED on exam.  Encouraged her to continue using her vaginal dilator frequently to avoid vaginal agglutination.  I will release CT chest results to her.  I do not see anything obvious on imaging, but will defer to radiologist interpretation.  Per NCCN surveillance recommendations, we will continue with visits every 3 months, alternating between my office and radiation oncology.  She will be due for a Pap smear in April.  I have talked to Santiago Glad about getting the patient rescheduled to see Dr. Sondra Come for that visit.  She will return to see me in 6 months.   We discussed signs and symptoms that would be concerning for disease recurrence and she knows to call if she develops any of these prior to her neck scheduled visit.  32 minutes of total time was spent for this patient encounter, including preparation, face-to-face counseling with the patient and coordination of care, and documentation of the encounter.  Jeral Pinch, MD  Division of Gynecologic Oncology  Department of Obstetrics and Gynecology  [facility]

## 2021-03-06 NOTE — Patient Instructions (Signed)
It was good to see you today.  I do not see or feel any evidence of cancer on your exam.  I will release the results of your chest CT scan once the radiologist has finished reading it.  Santiago Glad has made an appointment to see Dr. Sondra Come in April.  He will collect a Pap smear at that time.    I will see you back in 6 months.  My schedule is not out past the end of June.  Please call back in late May or early June to get a visit scheduled with me in late July or early August.

## 2021-03-10 ENCOUNTER — Telehealth: Payer: Self-pay

## 2021-03-10 NOTE — Telephone Encounter (Signed)
Spoke with Ms. Pellicano this morning and reviewed recent CT results. Per Joylene John, NP nodule has resolved. No new suspicious pulmonary nodule or mass noted. Patient verbalized understanding and happy with news. Instructed to call with any needs.

## 2021-05-18 NOTE — Progress Notes (Incomplete)
?Radiation Oncology         [phone] ?________________________________ ? ?Name: Madeline Howard MRN: [id number]  ?Date: 05/19/2021  DOB: 1965-11-05 ? ?Follow-Up Visit Note ? ?CC: Patient, [name]  No ref. provider found ? ?No diagnosis found. ? 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 ?Interval Since Last Radiation: 1 year and 3 days  ? ?Radiation Treatment Dates: 02/28/2020 through 05/16/2020 ?  ?Site: Pelvis ?Technique: 3D ?Total Dose (Gy): 45/45 ?Dose per Fx (Gy): 1.8 ?Completed Fx: 25/25 ?Beam Energies: 15X ?  ?Site: Right pelvis boost ?Technique: 3D ?Total Dose (Gy): 5.4/5.4 ?Dose per Fx (Gy): 1.8 ?Completed Fx: 3/3 ?Beam Energies: 15X ?  ?Site: Cervix; boost ?Technique: HDR-brachytherapy ?Total Dose (Gy): 27.5/27.5 ?Dose per Fx (Gy): 5.5 ?Completed Fx: 5/5 ?Beam Energies: Ir-192 ? ?Narrative:  The patient returns today for routine follow-up, she was last seen here for follow-up on 06/20/20 (unable to make last appointment this past October 2022). Since her last visit,  re-staging PET performed on 08/14/21 showed a marked interval response to therapy, demonstrated by no definable mass in the ?area of cervix and vagina, decreased bulk of the uterus and cervix, and FDG uptake only slightly greater than blood pool activity throughout the area, with the exception of the upper vagina which showed mildly increased nonspecific FDG uptake in the setting of radiotherapy.  PET also showed a new nonspecific tiny left upper lobe pulmonary nodule without FDG uptake (short term follow-up is recommended in regards to this finding given the patient's history). Otherwise, PET showed no signs of nodal or visceral metastasis.  ? ?The patient also followed up with Dr. Berline Lopes on 08/20/20. During which time, the patient was noted to be doing very well and reported minimal side effects related to radiation including some skin irritation in her left groin and mild fatigue which had since resolved.  Otherwise, the patient denied any GU concerns, reported using her vaginal dilator regularly, and was NED on examination. ? ?Given that imaging showed a complete response to treatment and that the patient was NED on examination by Dr. Berline Lopes, Dr. Alvy Bimler cleared the patient for port removal on 09/03/20.           ? ?During her most recent follow up with Dr. Berline Lopes on 03/06/21, the patient was again noted as NED on examination. The patient also endorsed using her vaginal dilator once a week, and was encouraged to use it more  frequently to avoid vaginal agglutination. ? ?For the left upper pulmonary nodule seen on her most recent PET, Dr. Berline Lopes ordered a follow-up chest CT on 03/06/21 which showed resolution of the nodule.  ? ?*** ? ?Allergies:  has No Known Allergies. ? ?Meds: ?Current Outpatient Medications  ?Medication Sig Dispense Refill  ? amLODipine (NORVASC) 10 MG tablet TAKE 1 TABLET BY MOUTH EVERY DAY 30 tablet 0  ? Chlorpheniramine Maleate (ALLERGY PO) Take 1 tablet by mouth daily.    ? famotidine (PEPCID) 20 MG tablet Take 20 mg by mouth daily. OTC    ? Multiple Vitamin (MULTIVITAMIN) capsule Take 1 capsule by mouth daily.    ? RESTASIS 0.05 % ophthalmic emulsion 1 drop as needed.    ? ?No current facility-administered medications for this encounter.  ? ? ?Physical Findings: ?The patient is in no acute distress. Patient is alert and oriented. ? vitals were not taken for this visit. .  No significant changes. Lungs are clear to auscultation bilaterally. Heart has regular rate and  rhythm. No palpable cervical, supraclavicular, or axillary adenopathy. Abdomen soft, non-tender, normal bowel sounds. ? ?On pelvic examination the external genitalia were unremarkable. A speculum exam was performed. There are no mucosal lesions noted in the vaginal vault. A Pap smear was obtained of the proximal vagina. On bimanual and rectovaginal examination there were no pelvic masses appreciated. *** ? ? ? ?Lab Findings: ?Lab  Results  ?Component Value Date  ? WBC 4.8 05/29/2020  ? HGB 11.9 (L) 05/29/2020  ? HCT 35.7 (L) 05/29/2020  ? MCV 98.3 05/29/2020  ? PLT 159 05/29/2020  ? ? ?Radiographic Findings: ?No results found. ? 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 ?The patient is recovering from the effects of radiation.  *** ? ?Plan:  *** ? ? ?*** minutes of total time was spent for this patient encounter, including preparation, face-to-face counseling with the patient and coordination of care, physical exam, and documentation of the encounter. ?____________________________________ ? ?Blair Promise, PhD, MD ? ?This document serves as a record of services personally performed by Gery Pray, MD. It was created on his behalf by Roney Mans, a trained medical scribe. The creation of this record is based on the scribe's personal observations and the provider's statements to them. This document has been checked and approved by the attending provider. ? ?

## 2021-05-19 ENCOUNTER — Ambulatory Visit
Admission: RE | Admit: 2021-05-19 | Discharge: 2021-05-19 | Disposition: A | Discharge status: Home / Self Care | Payer: Medicaid Other | Source: Ambulatory Visit | Attending: Radiation Oncology | Admitting: Radiation Oncology

## 2021-05-20 ENCOUNTER — Telehealth: Payer: Self-pay | Admitting: *Deleted

## 2021-05-20 NOTE — Telephone Encounter (Signed)
Called patient to ask about rescheduling fu from 05-19-21, unable to leave message due to mail box being full ?

## 2021-05-21 ENCOUNTER — Telehealth: Payer: Self-pay | Admitting: *Deleted

## 2021-05-21 NOTE — Telephone Encounter (Signed)
CALLED PATIENT TO ASK ABOUT RESCHEDULING MISSED FU ON 05-19-21, UNABLE TO LEAVE MESSAGE DUE TO VM BEING FULL ?

## 2022-09-17 IMAGING — CT CT ABD-PELV W/ CM
2 of 5 series · 16 of 46 positions shown, 18 images · IV contrast (APPLIED)
Comparison: None.

CLINICAL DATA: New diagnosis cervical cancer

EXAM:
CT ABDOMEN AND PELVIS WITH CONTRAST
TECHNIQUE: Multidetector CT imaging of the abdomen and pelvis was performed
using the standard protocol following bolus administration of
intravenous contrast.
CONTRAST:  100mL OMNIPAQUE IOHEXOL 300 MG/ML SOLN, additional oral
enteric contrast

[Series 2: axial st · axial · 0.81mm/px · z∈[-681,-271]mm · 13 of 96 slices shown, 15 images]
[im 7/96  soft-tissue]
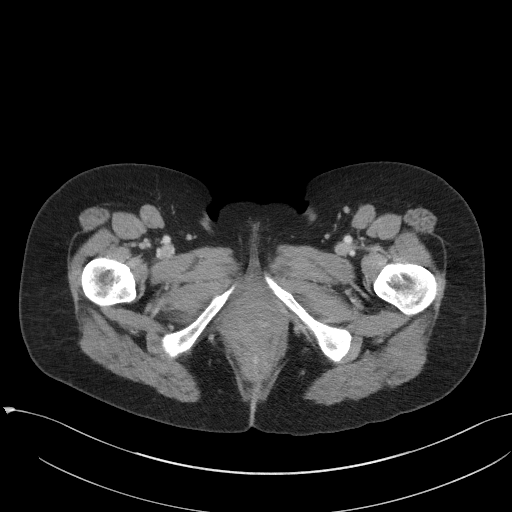
[im 7/96  bone]
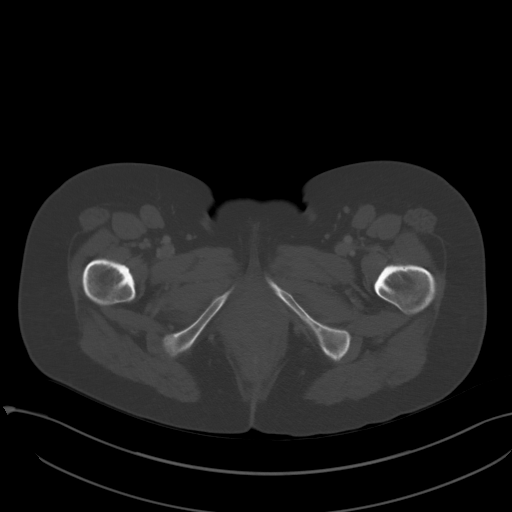
[im 14/96  soft-tissue]
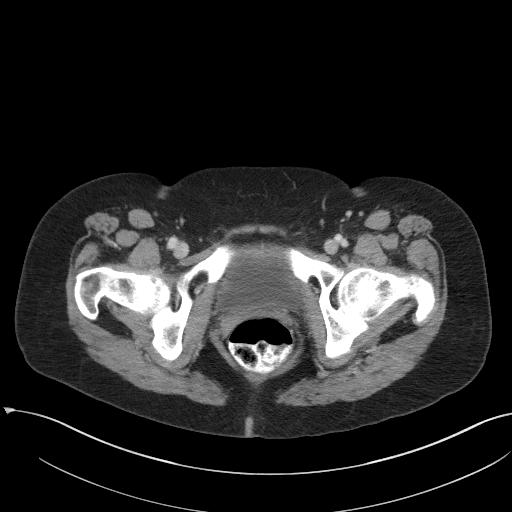
[im 21/96  soft-tissue]
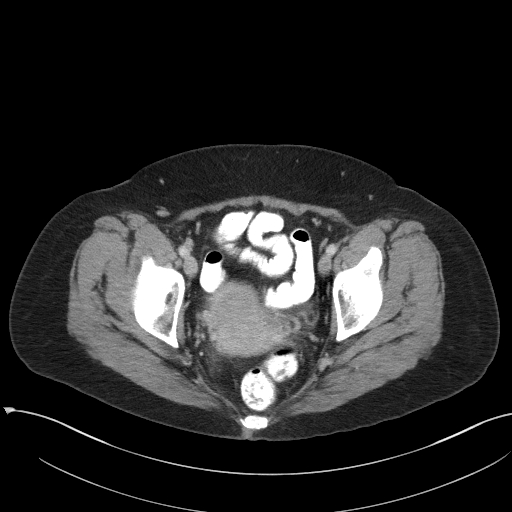
[im 28/96  soft-tissue]
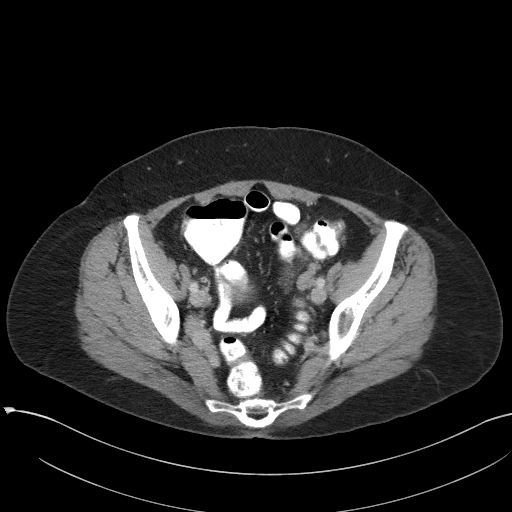
[im 34/96  soft-tissue]
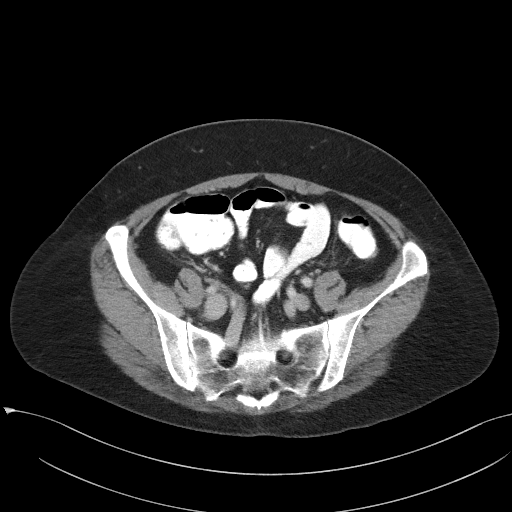
[im 41/96  soft-tissue]
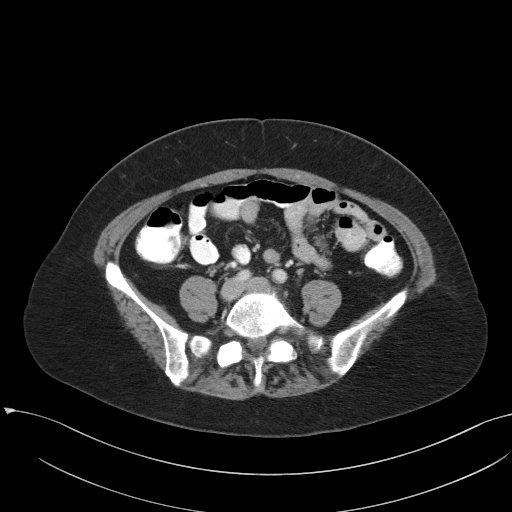
[im 48/96  soft-tissue]
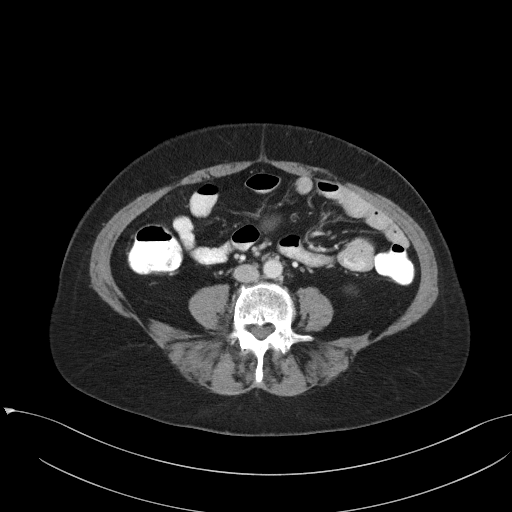
[im 55/96  soft-tissue]
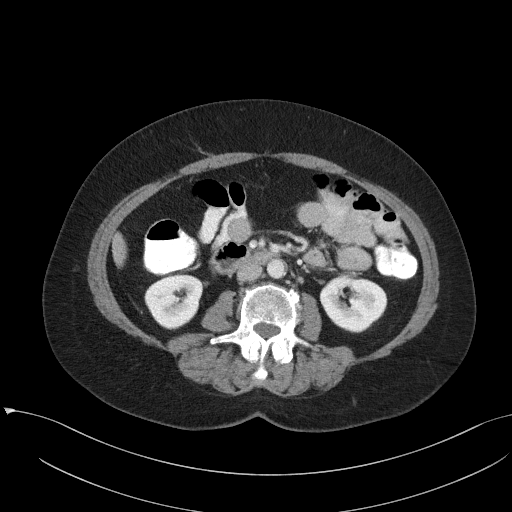
[im 62/96  soft-tissue]
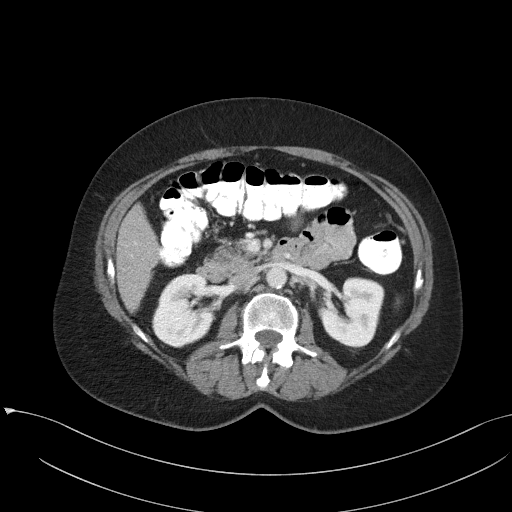
[im 62/96  bone]
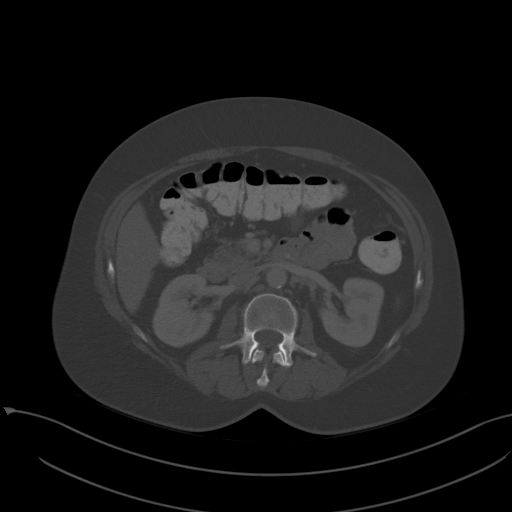
[im 68/96  soft-tissue]
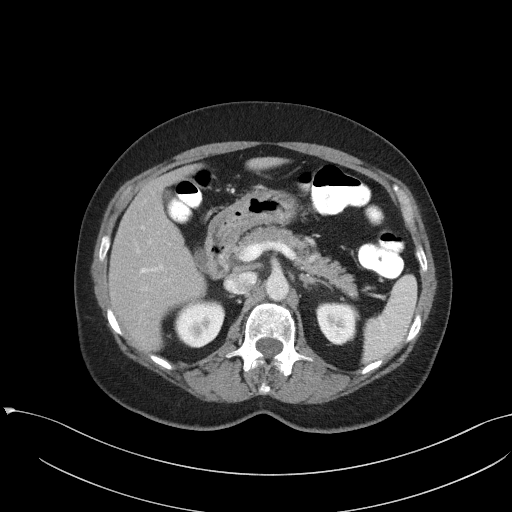
[im 75/96  soft-tissue]
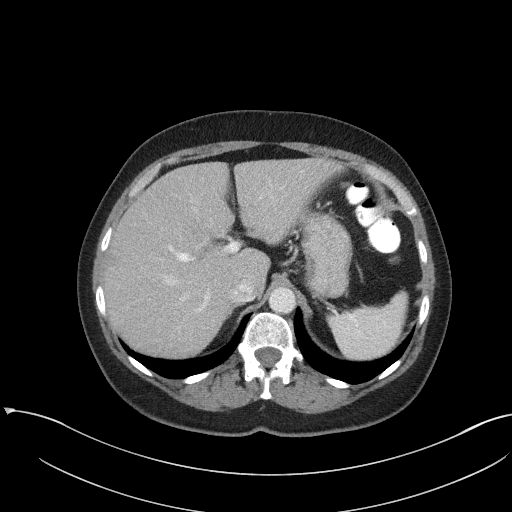
[im 82/96  soft-tissue]
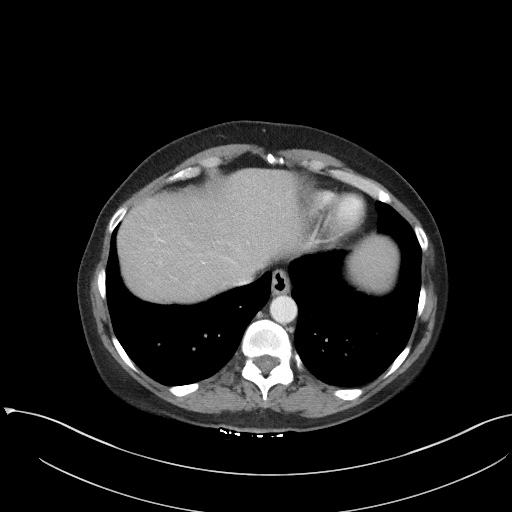
[im 89/96  soft-tissue]
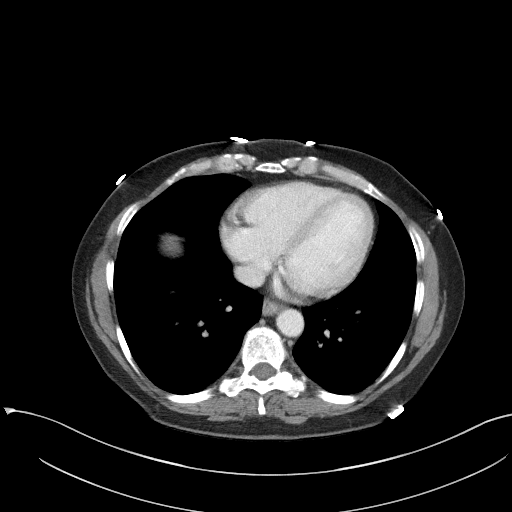

[Series 5: coronal st · coronal · 0.68mm/px · 3 of 94 slices shown]
[im 32/94  soft-tissue]
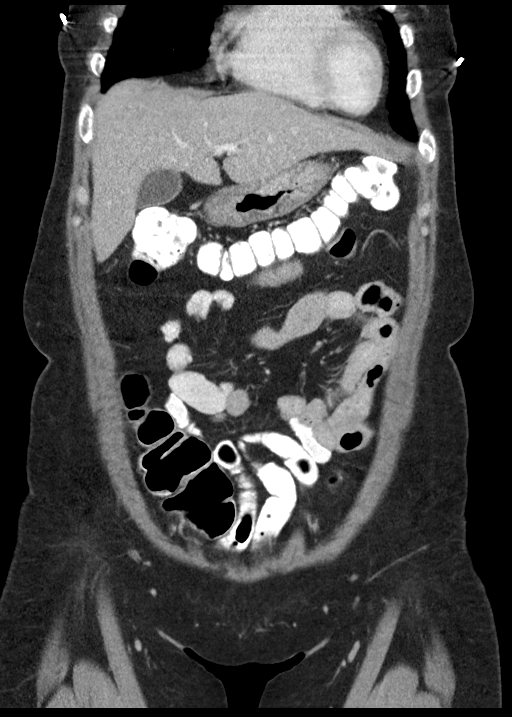
[im 42/94  soft-tissue]
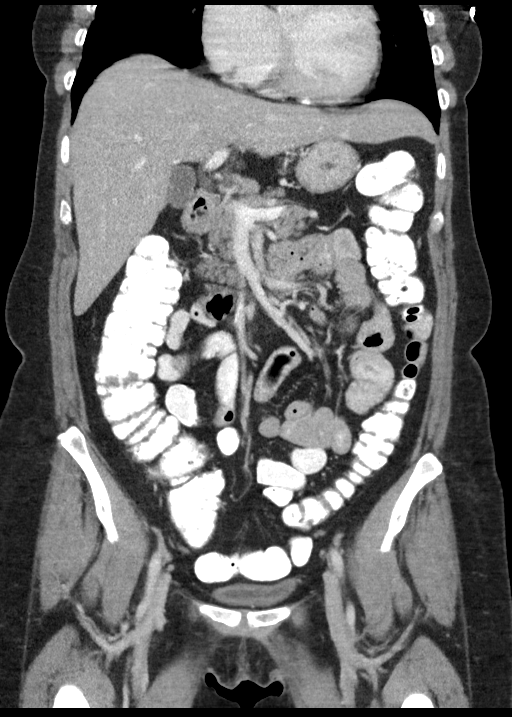
[im 52/94  soft-tissue]
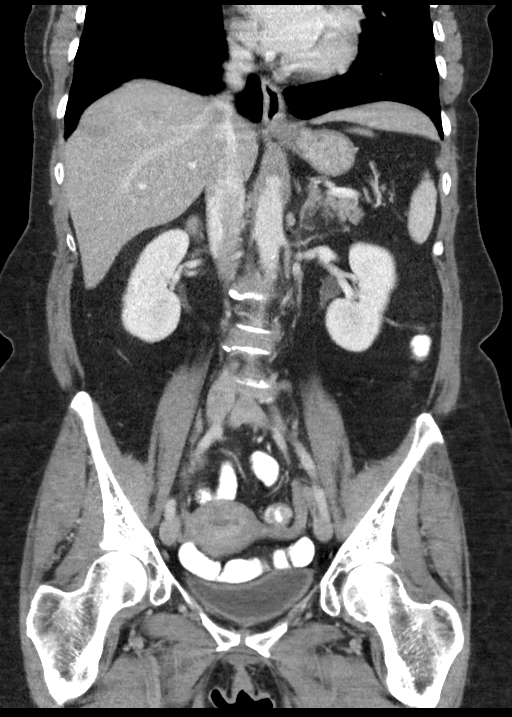

[16 of 46 positions shown; findings below may reference images not displayed]

FINDINGS: Lower chest: No acute abnormality.

Hepatobiliary: No solid liver abnormality is seen. No gallstones,
gallbladder wall thickening, or biliary dilatation.

Pancreas: Unremarkable. No pancreatic ductal dilatation or
surrounding inflammatory changes.

Spleen: Normal in size without significant abnormality.

Adrenals/Urinary Tract: Adrenal glands are unremarkable. Kidneys are
normal, without renal calculi, solid lesion, or hydronephrosis.
Bladder is unremarkable.

Stomach/Bowel: Stomach is within normal limits. Appendix appears
normal. No evidence of bowel wall thickening, distention, or
inflammatory changes.

Vascular/Lymphatic: Aortic atherosclerosis. No enlarged abdominal or
pelvic lymph nodes.

Reproductive: Probable fibroid of the anterior uterine body, not
well delineated by CT (series 6, image 51). Known cervical
malignancy is not well appreciated.

Other: No abdominal wall hernia or abnormality. No abdominopelvic
ascites.

Musculoskeletal: No acute or significant osseous findings.
IMPRESSION: 1. Known cervical malignancy is not well appreciated. There is no
evidence of lymphadenopathy or metastatic disease in the abdomen or
pelvis.
2. Probable fibroid of the anterior uterine body, not well
delineated by CT.

Aortic Atherosclerosis ([id number]).
